# Patient Record
Sex: Male | Born: 1955 | Race: White | Hispanic: No | Marital: Married | Smoking: Former smoker
Health system: Southern US, Community
[De-identification: ages and names within clinical notes are randomized; demographics above are authoritative.]

## PROBLEM LIST (undated history)

## (undated) DIAGNOSIS — N3941 Urge incontinence: Secondary | ICD-10-CM

## (undated) DIAGNOSIS — G709 Myoneural disorder, unspecified: Secondary | ICD-10-CM

## (undated) DIAGNOSIS — M549 Dorsalgia, unspecified: Secondary | ICD-10-CM

## (undated) DIAGNOSIS — R002 Palpitations: Secondary | ICD-10-CM

## (undated) DIAGNOSIS — M51369 Other intervertebral disc degeneration, lumbar region without mention of lumbar back pain or lower extremity pain: Secondary | ICD-10-CM

## (undated) DIAGNOSIS — N400 Enlarged prostate without lower urinary tract symptoms: Secondary | ICD-10-CM

## (undated) DIAGNOSIS — G20 Parkinson's disease: Secondary | ICD-10-CM

## (undated) DIAGNOSIS — M109 Gout, unspecified: Secondary | ICD-10-CM

## (undated) DIAGNOSIS — K760 Fatty (change of) liver, not elsewhere classified: Secondary | ICD-10-CM

## (undated) DIAGNOSIS — F410 Panic disorder [episodic paroxysmal anxiety] without agoraphobia: Secondary | ICD-10-CM

## (undated) DIAGNOSIS — M5136 Other intervertebral disc degeneration, lumbar region: Secondary | ICD-10-CM

## (undated) DIAGNOSIS — N200 Calculus of kidney: Secondary | ICD-10-CM

## (undated) DIAGNOSIS — G20A1 Parkinson's disease without dyskinesia, without mention of fluctuations: Secondary | ICD-10-CM

## (undated) DIAGNOSIS — G4730 Sleep apnea, unspecified: Secondary | ICD-10-CM

## (undated) DIAGNOSIS — J309 Allergic rhinitis, unspecified: Secondary | ICD-10-CM

## (undated) HISTORY — PX: APPENDECTOMY: SHX54

## (undated) HISTORY — PX: TONSILLECTOMY: SUR1361

## (undated) SURGERY — Surgical Case
Anesthesia: *Unknown

---

## 2008-02-01 ENCOUNTER — Ambulatory Visit: Payer: Self-pay | Admitting: Internal Medicine

## 2008-12-24 ENCOUNTER — Emergency Department: Payer: Self-pay | Admitting: Emergency Medicine

## 2009-04-22 ENCOUNTER — Emergency Department: Payer: Self-pay | Admitting: Emergency Medicine

## 2010-07-31 ENCOUNTER — Emergency Department: Payer: Self-pay | Admitting: Internal Medicine

## 2012-02-07 DIAGNOSIS — N419 Inflammatory disease of prostate, unspecified: Secondary | ICD-10-CM | POA: Insufficient documentation

## 2012-03-13 DIAGNOSIS — Z809 Family history of malignant neoplasm, unspecified: Secondary | ICD-10-CM | POA: Insufficient documentation

## 2013-04-06 DIAGNOSIS — R102 Pelvic and perineal pain: Secondary | ICD-10-CM | POA: Insufficient documentation

## 2013-04-06 DIAGNOSIS — N3941 Urge incontinence: Secondary | ICD-10-CM | POA: Insufficient documentation

## 2013-04-06 DIAGNOSIS — R3915 Urgency of urination: Secondary | ICD-10-CM | POA: Insufficient documentation

## 2013-04-06 DIAGNOSIS — R39198 Other difficulties with micturition: Secondary | ICD-10-CM | POA: Insufficient documentation

## 2013-04-06 DIAGNOSIS — R351 Nocturia: Secondary | ICD-10-CM | POA: Insufficient documentation

## 2013-11-07 ENCOUNTER — Emergency Department: Payer: Self-pay | Admitting: Emergency Medicine

## 2013-11-07 LAB — CBC
HCT: 45.9 % (ref 40.0–52.0)
HGB: 15.9 g/dL (ref 13.0–18.0)
MCH: 31 pg (ref 26.0–34.0)
MCHC: 34.6 g/dL (ref 32.0–36.0)
MCV: 90 fL (ref 80–100)
PLATELETS: 202 10*3/uL (ref 150–440)
RBC: 5.11 10*6/uL (ref 4.40–5.90)
RDW: 13 % (ref 11.5–14.5)
WBC: 8.1 10*3/uL (ref 3.8–10.6)

## 2013-11-07 LAB — TROPONIN I
Troponin-I: <0.02 ng/mL
Troponin-I: <0.02 ng/mL

## 2013-11-07 LAB — BASIC METABOLIC PANEL
Anion Gap: 2 — ABNORMAL LOW (ref 7–16)
BUN: 8 mg/dL (ref 7–18)
CALCIUM: 8.6 mg/dL (ref 8.5–10.1)
CHLORIDE: 105 mmol/L (ref 98–107)
CO2: 30 mmol/L (ref 21–32)
CREATININE: 1.31 mg/dL — AB (ref 0.60–1.30)
EGFR (African American): >60
EGFR (Non-African Amer.): >60
GLUCOSE: 96 mg/dL (ref 65–99)
Osmolality: 272 (ref 275–301)
Potassium: 3.6 mmol/L (ref 3.5–5.1)
Sodium: 137 mmol/L (ref 136–145)

## 2013-11-07 LAB — D-DIMER(ARMC): D-Dimer: 199 ng/ml

## 2013-11-11 ENCOUNTER — Ambulatory Visit: Payer: Self-pay | Admitting: Internal Medicine

## 2014-06-29 DIAGNOSIS — R1031 Right lower quadrant pain: Secondary | ICD-10-CM | POA: Insufficient documentation

## 2014-06-29 DIAGNOSIS — N200 Calculus of kidney: Secondary | ICD-10-CM | POA: Insufficient documentation

## 2014-06-29 DIAGNOSIS — R350 Frequency of micturition: Secondary | ICD-10-CM | POA: Insufficient documentation

## 2014-08-16 DIAGNOSIS — J309 Allergic rhinitis, unspecified: Secondary | ICD-10-CM | POA: Insufficient documentation

## 2014-08-16 DIAGNOSIS — N400 Enlarged prostate without lower urinary tract symptoms: Secondary | ICD-10-CM | POA: Insufficient documentation

## 2014-08-16 DIAGNOSIS — F410 Panic disorder [episodic paroxysmal anxiety] without agoraphobia: Secondary | ICD-10-CM | POA: Insufficient documentation

## 2014-08-16 DIAGNOSIS — M5136 Other intervertebral disc degeneration, lumbar region: Secondary | ICD-10-CM | POA: Insufficient documentation

## 2014-08-16 DIAGNOSIS — F411 Generalized anxiety disorder: Secondary | ICD-10-CM | POA: Insufficient documentation

## 2014-08-16 DIAGNOSIS — M51369 Other intervertebral disc degeneration, lumbar region without mention of lumbar back pain or lower extremity pain: Secondary | ICD-10-CM | POA: Insufficient documentation

## 2015-11-17 ENCOUNTER — Emergency Department: Payer: Self-pay

## 2015-11-17 ENCOUNTER — Observation Stay
Admission: EM | Admit: 2015-11-17 | Discharge: 2015-11-19 | Disposition: A | Discharge status: Home / Self Care | Payer: Self-pay | Attending: Internal Medicine | Admitting: Internal Medicine

## 2015-11-17 ENCOUNTER — Encounter: Payer: Self-pay | Admitting: Emergency Medicine

## 2015-11-17 DIAGNOSIS — R748 Abnormal levels of other serum enzymes: Secondary | ICD-10-CM | POA: Insufficient documentation

## 2015-11-17 DIAGNOSIS — Z79899 Other long term (current) drug therapy: Secondary | ICD-10-CM | POA: Insufficient documentation

## 2015-11-17 DIAGNOSIS — F419 Anxiety disorder, unspecified: Secondary | ICD-10-CM | POA: Insufficient documentation

## 2015-11-17 DIAGNOSIS — Z886 Allergy status to analgesic agent status: Secondary | ICD-10-CM | POA: Insufficient documentation

## 2015-11-17 DIAGNOSIS — Z833 Family history of diabetes mellitus: Secondary | ICD-10-CM | POA: Insufficient documentation

## 2015-11-17 DIAGNOSIS — Z9889 Other specified postprocedural states: Secondary | ICD-10-CM | POA: Insufficient documentation

## 2015-11-17 DIAGNOSIS — Z87892 Personal history of anaphylaxis: Secondary | ICD-10-CM | POA: Insufficient documentation

## 2015-11-17 DIAGNOSIS — Z8249 Family history of ischemic heart disease and other diseases of the circulatory system: Secondary | ICD-10-CM | POA: Insufficient documentation

## 2015-11-17 DIAGNOSIS — R778 Other specified abnormalities of plasma proteins: Secondary | ICD-10-CM

## 2015-11-17 DIAGNOSIS — R079 Chest pain, unspecified: Principal | ICD-10-CM | POA: Insufficient documentation

## 2015-11-17 DIAGNOSIS — N400 Enlarged prostate without lower urinary tract symptoms: Secondary | ICD-10-CM | POA: Insufficient documentation

## 2015-11-17 DIAGNOSIS — M545 Low back pain: Secondary | ICD-10-CM | POA: Insufficient documentation

## 2015-11-17 DIAGNOSIS — M549 Dorsalgia, unspecified: Secondary | ICD-10-CM | POA: Insufficient documentation

## 2015-11-17 DIAGNOSIS — Z87891 Personal history of nicotine dependence: Secondary | ICD-10-CM | POA: Insufficient documentation

## 2015-11-17 DIAGNOSIS — Z880 Allergy status to penicillin: Secondary | ICD-10-CM | POA: Insufficient documentation

## 2015-11-17 DIAGNOSIS — Z888 Allergy status to other drugs, medicaments and biological substances status: Secondary | ICD-10-CM | POA: Insufficient documentation

## 2015-11-17 DIAGNOSIS — F410 Panic disorder [episodic paroxysmal anxiety] without agoraphobia: Secondary | ICD-10-CM | POA: Insufficient documentation

## 2015-11-17 DIAGNOSIS — R7989 Other specified abnormal findings of blood chemistry: Secondary | ICD-10-CM

## 2015-11-17 DIAGNOSIS — Z7901 Long term (current) use of anticoagulants: Secondary | ICD-10-CM | POA: Insufficient documentation

## 2015-11-17 DIAGNOSIS — R0602 Shortness of breath: Secondary | ICD-10-CM | POA: Insufficient documentation

## 2015-11-17 DIAGNOSIS — G8929 Other chronic pain: Secondary | ICD-10-CM | POA: Insufficient documentation

## 2015-11-17 HISTORY — DX: Panic disorder (episodic paroxysmal anxiety): F41.0

## 2015-11-17 HISTORY — DX: Benign prostatic hyperplasia without lower urinary tract symptoms: N40.0

## 2015-11-17 HISTORY — DX: Dorsalgia, unspecified: M54.9

## 2015-11-17 LAB — COMPREHENSIVE METABOLIC PANEL
ALK PHOS: 61 U/L (ref 38–126)
ALT: 27 U/L (ref 17–63)
ANION GAP: 6 (ref 5–15)
AST: 31 U/L (ref 15–41)
Albumin: 4.3 g/dL (ref 3.5–5.0)
BUN: 8 mg/dL (ref 6–20)
CALCIUM: 9.2 mg/dL (ref 8.9–10.3)
CO2: 25 mmol/L (ref 22–32)
CREATININE: 1.07 mg/dL (ref 0.61–1.24)
Chloride: 106 mmol/L (ref 101–111)
Glucose, Bld: 114 mg/dL — ABNORMAL HIGH (ref 65–99)
Potassium: 4 mmol/L (ref 3.5–5.1)
SODIUM: 137 mmol/L (ref 135–145)
TOTAL PROTEIN: 7.6 g/dL (ref 6.5–8.1)
Total Bilirubin: 0.9 mg/dL (ref 0.3–1.2)

## 2015-11-17 LAB — TROPONIN I
TROPONIN I: 0.07 ng/mL — AB (ref <0.031)
TROPONIN I: 0.57 ng/mL — AB (ref <0.031)
Troponin I: <0.03 ng/mL (ref <0.031)
Troponin I: 0.3 ng/mL — ABNORMAL HIGH (ref <0.031)

## 2015-11-17 LAB — CBC
HEMATOCRIT: 45 % (ref 40.0–52.0)
Hemoglobin: 15.6 g/dL (ref 13.0–18.0)
MCH: 31.3 pg (ref 26.0–34.0)
MCHC: 34.7 g/dL (ref 32.0–36.0)
MCV: 90.1 fL (ref 80.0–100.0)
Platelets: 187 10*3/uL (ref 150–440)
RBC: 5 MIL/uL (ref 4.40–5.90)
RDW: 13.4 % (ref 11.5–14.5)
WBC: 6.8 10*3/uL (ref 3.8–10.6)

## 2015-11-17 LAB — GLUCOSE, CAPILLARY: Glucose-Capillary: 90 mg/dL (ref 65–99)

## 2015-11-17 MED ORDER — CLONAZEPAM 0.5 MG PO TABS
0.5000 mg | ORAL_TABLET | Freq: Two times a day (BID) | ORAL | Status: DC
  Administered 2015-11-17 – 2015-11-19 (×4): 0.5 mg via ORAL
  Filled 2015-11-17 (×4): qty 1

## 2015-11-17 MED ORDER — ACETAMINOPHEN 650 MG RE SUPP: 650.0000 mg | Freq: Four times a day (QID) | RECTAL | Status: DC | PRN

## 2015-11-17 MED ORDER — KETOROLAC TROMETHAMINE 30 MG/ML IJ SOLN
30.0000 mg | Freq: Once | INTRAMUSCULAR | Status: AC
  Administered 2015-11-17: 30 mg via INTRAVENOUS
  Filled 2015-11-17: qty 1

## 2015-11-17 MED ORDER — NITROGLYCERIN 0.4 MG SL SUBL: 0.4000 mg | SUBLINGUAL_TABLET | SUBLINGUAL | Status: DC | PRN

## 2015-11-17 MED ORDER — FAMOTIDINE 20 MG PO TABS
20.0000 mg | ORAL_TABLET | Freq: Two times a day (BID) | ORAL | Status: DC
  Filled 2015-11-17 (×4): qty 1

## 2015-11-17 MED ORDER — ZOLPIDEM TARTRATE 5 MG PO TABS: 5.0000 mg | ORAL_TABLET | Freq: Every evening | ORAL | Status: DC | PRN

## 2015-11-17 MED ORDER — BUPROPION HCL 100 MG PO TABS
100.0000 mg | ORAL_TABLET | Freq: Every day | ORAL | Status: DC
  Filled 2015-11-17 (×3): qty 1

## 2015-11-17 MED ORDER — NITROGLYCERIN 2 % TD OINT
1.0000 [in_us] | TOPICAL_OINTMENT | Freq: Once | TRANSDERMAL | Status: AC
  Administered 2015-11-17: 1 [in_us] via TOPICAL
  Filled 2015-11-17: qty 1

## 2015-11-17 MED ORDER — ONDANSETRON HCL 4 MG PO TABS: 4.0000 mg | ORAL_TABLET | Freq: Four times a day (QID) | ORAL | Status: DC | PRN

## 2015-11-17 MED ORDER — ACETAMINOPHEN 325 MG PO TABS
650.0000 mg | ORAL_TABLET | Freq: Four times a day (QID) | ORAL | Status: DC | PRN
  Administered 2015-11-17 – 2015-11-18 (×2): 650 mg via ORAL
  Filled 2015-11-17 (×2): qty 2

## 2015-11-17 MED ORDER — ASPIRIN 81 MG PO CHEW
324.0000 mg | CHEWABLE_TABLET | Freq: Once | ORAL | Status: AC
  Administered 2015-11-17: 324 mg via ORAL
  Filled 2015-11-17: qty 4

## 2015-11-17 MED ORDER — OXYCODONE HCL 5 MG PO TABS: 5.0000 mg | ORAL_TABLET | ORAL | Status: DC | PRN

## 2015-11-17 MED ORDER — DOCUSATE SODIUM 100 MG PO CAPS
100.0000 mg | ORAL_CAPSULE | Freq: Two times a day (BID) | ORAL | Status: DC
  Administered 2015-11-17 – 2015-11-19 (×4): 100 mg via ORAL
  Filled 2015-11-17 (×4): qty 1

## 2015-11-17 MED ORDER — ENOXAPARIN SODIUM 120 MG/0.8ML ~~LOC~~ SOLN
1.0000 mg/kg | Freq: Two times a day (BID) | SUBCUTANEOUS | Status: AC
  Administered 2015-11-18 (×2): 115 mg via SUBCUTANEOUS
  Filled 2015-11-17 (×4): qty 0.8

## 2015-11-17 MED ORDER — DIAZEPAM 5 MG/ML IJ SOLN
5.0000 mg | Freq: Once | INTRAMUSCULAR | Status: AC
  Administered 2015-11-17: 5 mg via INTRAVENOUS
  Filled 2015-11-17: qty 2

## 2015-11-17 MED ORDER — METOPROLOL TARTRATE 25 MG PO TABS
12.5000 mg | ORAL_TABLET | Freq: Two times a day (BID) | ORAL | Status: DC
  Administered 2015-11-17 – 2015-11-19 (×4): 12.5 mg via ORAL
  Filled 2015-11-17 (×5): qty 1

## 2015-11-17 MED ORDER — MORPHINE SULFATE (PF) 2 MG/ML IV SOLN: 2.0000 mg | INTRAVENOUS | Status: DC | PRN

## 2015-11-17 MED ORDER — ONDANSETRON HCL 4 MG/2ML IJ SOLN: 4.0000 mg | Freq: Four times a day (QID) | INTRAMUSCULAR | Status: DC | PRN

## 2015-11-17 MED ORDER — CYCLOBENZAPRINE HCL 10 MG PO TABS: 10.0000 mg | ORAL_TABLET | Freq: Two times a day (BID) | ORAL | Status: DC | PRN

## 2015-11-17 MED ORDER — ENOXAPARIN SODIUM 120 MG/0.8ML ~~LOC~~ SOLN
1.0000 mg/kg | Freq: Once | SUBCUTANEOUS | Status: AC
  Administered 2015-11-17: 115 mg via SUBCUTANEOUS
  Filled 2015-11-17: qty 0.8

## 2015-11-17 MED ORDER — SODIUM CHLORIDE 0.9% FLUSH
3.0000 mL | Freq: Two times a day (BID) | INTRAVENOUS | Status: DC
  Administered 2015-11-17 – 2015-11-19 (×4): 3 mL via INTRAVENOUS

## 2015-11-17 NOTE — Progress Notes (Signed)
Patient arrived to 2A Room 249. Patient denies pain and all questions answered. Patient oriented to unit and Fall Safety Plan signed. Skin assessment completed with Vincente Liberty RN and skin intact. A&Ox4, VSS, and NSR on tele box #[id number]. Nursing staff will continue to monitor. Gabriel Reaper, RN

## 2015-11-17 NOTE — ED Notes (Signed)
Report given to Kim, RN.

## 2015-11-17 NOTE — ED Notes (Signed)
Pt reports having a headache at this time. MD made aware.

## 2015-11-17 NOTE — Progress Notes (Signed)
MD notified of critical troponin 0.57; no new orders at this time. Nursing staff will continue to monitor. Earleen Reaper, RN

## 2015-11-17 NOTE — H&P (Signed)
[place] at [place] NAME: Pranith Veeder    MR#:  [id number]  DATE OF BIRTH:  05-Sep-1955  DATE OF ADMISSION:  11/17/2015  PRIMARY CARE PHYSICIAN: No primary care provider on file.   REQUESTING/REFERRING PHYSICIAN: Dr. Jimmye Norman  CHIEF COMPLAINT:  Chest pain  HISTORY OF PRESENT ILLNESS:  Nagee Prey  is a 60 y.o. male with a known history of Panic attacks, chronic low back pain has been experiencing chest pain radiating to the left arm and upper back today morning at around 8:00. He feels like it is a crampy pain. EMS brought him to the ED, patient was not given aspirin as he is allergic to and states it hives. Nitropaste was at as to the anterior chest wall and patient was chest pain-free during my examination. Reporting that patient was having panic attack prior to this chest pain episode. He was short of breath that you make and tachycardic and was panic prior to this episode. Not had any heart attacks in the past and not seen by any cardiologist. Initial troponin is 0.03 but repeat one is at 0.07  PAST MEDICAL HISTORY:   Past Medical History  Diagnosis Date  . Panic attacks   . Back pain   . Enlarged prostate     PAST SURGICAL HISTOIRY:   Past Surgical History  Procedure Laterality Date  . Appendectomy    . Tonsillectomy      SOCIAL HISTORY:   Social History  Substance Use Topics  . Smoking status: Former Research scientist (life sciences)  . Smokeless tobacco: Not on file  . Alcohol Use: No    FAMILY HISTORY:  Hypertension diabetes runs in his family  DRUG ALLERGIES:   Allergies  Allergen Reactions  . Ibuprofen Hives  . Penicillins Hives    REVIEW OF SYSTEMS:  CONSTITUTIONAL: No fever, fatigue or weakness.  EYES: No blurred or double vision.  EARS, NOSE, AND THROAT: No tinnitus or ear pain.  RESPIRATORY: No cough, shortness of breath, wheezing or hemoptysis.  CARDIOVASCULAR: No chest pain, orthopnea, edema.  GASTROINTESTINAL: No  nausea, vomiting, diarrhea or abdominal pain.  GENITOURINARY: No dysuria, hematuria.  ENDOCRINE: No polyuria, nocturia,  HEMATOLOGY: No anemia, easy bruising or bleeding SKIN: No rash or lesion. MUSCULOSKELETAL: No joint pain or arthritis.   NEUROLOGIC: No tingling, numbness, weakness.  PSYCHIATRY: No anxiety or depression.   MEDICATIONS AT HOME:   Prior to Admission medications   Not on File      VITAL SIGNS:  Blood pressure 117/81, pulse 92, temperature 98.4 F (36.9 C), temperature source Oral, resp. rate 20, height 5\' 8"  (1.727 m), weight 117.7 kg (259 lb 7.7 oz), SpO2 96 %.  PHYSICAL EXAMINATION:  GENERAL:  60 y.o.-year-old patient lying in the bed with no acute distress.  EYES: Pupils equal, round, reactive to light and accommodation. No scleral icterus. Extraocular muscles intact.  HEENT: Head atraumatic, normocephalic. Oropharynx and nasopharynx clear.  NECK:  Supple, no jugular venous distention. No thyroid enlargement, no tenderness.  LUNGS: Normal breath sounds bilaterally, no wheezing, rales,rhonchi or crepitation. No use of accessory muscles of respiration.  CARDIOVASCULAR: S1, S2 normal. No murmurs, rubs, or gallops. No anterior chest wall tenderness on palpation ABDOMEN: Soft, nontender, nondistended. Bowel sounds present. No organomegaly or mass.  EXTREMITIES: No pedal edema, cyanosis, or clubbing.  NEUROLOGIC: Cranial nerves II through XII are intact. Muscle strength 5/5 in all extremities. Sensation intact. Gait not checked.  PSYCHIATRIC: The patient is alert and oriented  x 3.  SKIN: No obvious rash, lesion, or ulcer.   LABORATORY PANEL:   CBC  Recent Labs Lab 11/17/15 0852  WBC 6.8  HGB 15.6  HCT 45.0  PLT 187   ------------------------------------------------------------------------------------------------------------------  Chemistries   Recent Labs Lab 11/17/15 0852  NA 137  K 4.0  CL 106  CO2 25  GLUCOSE 114*  BUN 8  CREATININE 1.07   CALCIUM 9.2  AST 31  ALT 27  ALKPHOS 61  BILITOT 0.9   ------------------------------------------------------------------------------------------------------------------  Cardiac Enzymes  Recent Labs Lab 11/17/15 1106  TROPONINI 0.07*   ------------------------------------------------------------------------------------------------------------------  RADIOLOGY:  Dg Chest 2 View  11/17/2015  CLINICAL DATA:  Upper back pain radiating into chest pain down left arm this morning. EXAM: CHEST  2 VIEW COMPARISON:  11/07/2013 FINDINGS: The heart size and mediastinal contours are within normal limits. Both lungs are clear. The visualized skeletal structures are unremarkable. IMPRESSION: No active cardiopulmonary disease. Electronically Signed   By: Rolm Baptise M.D.   On: 11/17/2015 09:27    EKG:   Orders placed or performed during the hospital encounter of 11/17/15  . ED EKG  . ED EKG  . EKG 12-Lead  . EKG 12-Lead  EKG normal sinus rhythm with no acute ST-T wave changes  IMPRESSION AND PLAN:   Jarry Mihalek  is a 60 y.o. male with a known history of Panic attacks, chronic low back pain has been experiencing chest pain radiating to the left arm and upper back today morning at around 8:00. He feels like it is a crampy pain. EMS brought him to the ED, patient was not given aspirin as he is allergic to and states it hives. Nitropaste was at as to the anterior chest wall and patient was chest pain-free during my examination. Reporting that patient was having panic attack prior to this chest pain episode  #1 chest pain with elevated troponin Admitted to telemetry and cycle cardiac biomarkers Will obtain echocardiogram and cardiac consult is placed Patient is allergic to ibuprofen-gets hives aspirin was not given Lovenox 1 mg/kg subcutaneous every 12 hours will be given Check fasting lipid panel, we will start him on low-dose Lopressor  #2, chronic history of panic attacks Currently  patient is asymptomatic We will resume his home medications after med reconciliation  #3 chronic low back pain Provide pain management as needed  #4 benign prostatic hypertrophy We will resume his home medication after medication reconciliation  DVT prophylaxis currently patient is on Lovenox therapeutic dose  More than 50% time was spent on face-to-face education, counseling and coordination of care     All the records are reviewed and case discussed with ED provider. Management plans discussed with the patient, family and they are in agreement.  CODE STATUS: fc, wife POA  TOTAL TIME TAKING CARE OF THIS PATIENT: 45  minutes.    Nicholes Mango M.D on 11/17/2015 at 1:06 PM  Between 7am to 6pm - Pager - [phone]  After 6pm go to [email or web address] - password [email or web address]  [place] [facility]  Office  [phone]  CC: Primary care physician; No primary care provider on file.

## 2015-11-17 NOTE — ED Notes (Signed)
Patient brought in by [facility], per EMS patient walked into EMS bay stating that he was having chest pain, left arm and upper back pain. Patient has hx/o panic attacks and states that he has been having some life stressors recently.

## 2015-11-17 NOTE — ED Provider Notes (Signed)
[facility] Emergency Department Provider Note     Time seen: ----------------------------------------- 8:49 AM on 11/17/2015 -----------------------------------------    I have reviewed the triage vital signs and the nursing notes.   HISTORY  Chief Complaint Chest Pain    HPI Gabriel Dillon is a 60 y.o. male who presented to the EMS today stating he was having chest pain left arm and upper back pain. Patient states he has never had this happen before, feels like a cramp. He has not had any associated symptoms such as sweats, nausea, shortness of breath. He has a history of panic attacks and states he's had increased life stressors dealing with some family deaths. Patient denies recent illness.   No past medical history on file.  There are no active problems to display for this patient.   No past surgical history on file.  Allergies Review of patient's allergies indicates not on file.  Social History Social History  Substance Use Topics  . Smoking status: Not on file  . Smokeless tobacco: Not on file  . Alcohol Use: Not on file    Review of Systems Constitutional: Negative for fever. Eyes: Negative for visual changes. ENT: Negative for sore throat. Cardiovascular: Positive for left upper chest pain Respiratory: Negative for shortness of breath. Gastrointestinal: Negative for abdominal pain, vomiting and diarrhea. Genitourinary: Negative for dysuria. Musculoskeletal: Positive for left upper back pain, left arm pain Skin: Negative for rash. Neurological: Negative for headaches, focal weakness or numbness.  10-point ROS otherwise negative.  ____________________________________________   PHYSICAL EXAM:  VITAL SIGNS: ED Triage Vitals  Enc Vitals Group     BP --      Pulse --      Resp --      Temp --      Temp src --      SpO2 --      Weight --      Height --      Head Cir --      Peak Flow --      Pain Score --      Pain  Loc --      Pain Edu? --      Excl. in [place]? --     Constitutional: Alert and oriented. Well appearing and in no distress. Eyes: Conjunctivae are normal. PERRL. Normal extraocular movements. ENT   Head: Normocephalic and atraumatic.   Nose: No congestion/rhinnorhea.   Mouth/Throat: Mucous membranes are moist.   Neck: No stridor. Cardiovascular: Normal rate, regular rhythm. Normal and symmetric distal pulses are present in all extremities. No murmurs, rubs, or gallops. Respiratory: Normal respiratory effort without tachypnea nor retractions. Breath sounds are clear and equal bilaterally. No wheezes/rales/rhonchi. Gastrointestinal: Soft and nontender. No distention. No abdominal bruits.  Musculoskeletal: Nontender with normal range of motion in all extremities. Mild pain with range of motion of the shoulder, left trapezius muscle tenderness Neurologic:  Normal speech and language. No gross focal neurologic deficits are appreciated.  Skin:  Skin is warm, dry and intact. No rash noted. Psychiatric: Mood and affect are normal. Speech and behavior are normal. Patient exhibits appropriate insight and judgment. ____________________________________________  EKG: Interpreted by me. Normal sinus rhythm with a rate of 85 bpm, normal PR interval, normal QRS, normal QT interval. Normal axis. No evidence of acute infarction  ____________________________________________  ED COURSE:  Pertinent labs & imaging results that were available during my care of the patient were reviewed by me and considered in my medical decision  making (see chart for details). Patient is in no acute distress, will check basic labs and reevaluate. He is low risk for ACS. ____________________________________________    LABS (pertinent positives/negatives)  Labs Reviewed  COMPREHENSIVE METABOLIC PANEL - Abnormal; Notable for the following:    Glucose, Bld 114 (*)    All other components within normal limits   TROPONIN I - Abnormal; Notable for the following:    Troponin I 0.07 (*)    All other components within normal limits  CBC  TROPONIN I    RADIOLOGY Images were viewed by me  Chest x-ray IMPRESSION: No active cardiopulmonary disease. ____________________________________________  FINAL ASSESSMENT AND PLAN  Chest pain, elevated troponin  Plan: Patient with labs and imaging as dictated above. Patient presented with what appeared to be musculoskeletal pain. Repeat troponin went from 0.03 to .07. He has no risk factors that he knows of, no family history. He'll be given aspirin, nitroglycerin, Lovenox. I will discuss with the hospitalist for admission.   Earleen Newport, MD   Earleen Newport, MD 11/17/15 [phone]

## 2015-11-18 ENCOUNTER — Observation Stay
Admit: 2015-11-18 | Discharge: 2015-11-18 | Disposition: A | Discharge status: Home / Self Care | Payer: Self-pay | Attending: Internal Medicine | Admitting: Internal Medicine

## 2015-11-18 LAB — ECHOCARDIOGRAM COMPLETE
HEIGHTINCHES: 68 in
Weight: 4000 oz

## 2015-11-18 LAB — CBC
HEMATOCRIT: 40.6 % (ref 40.0–52.0)
HEMOGLOBIN: 14.1 g/dL (ref 13.0–18.0)
MCH: 32 pg (ref 26.0–34.0)
MCHC: 34.7 g/dL (ref 32.0–36.0)
MCV: 92 fL (ref 80.0–100.0)
Platelets: 168 10*3/uL (ref 150–440)
RBC: 4.41 MIL/uL (ref 4.40–5.90)
RDW: 13.4 % (ref 11.5–14.5)
WBC: 8.4 10*3/uL (ref 3.8–10.6)

## 2015-11-18 LAB — LIPID PANEL
CHOLESTEROL: 178 mg/dL (ref 0–200)
HDL: 34 mg/dL — ABNORMAL LOW (ref >40)
LDL CALC: 111 mg/dL — AB (ref 0–99)
Total CHOL/HDL Ratio: 5.2 RATIO
Triglycerides: 167 mg/dL — ABNORMAL HIGH (ref <150)
VLDL: 33 mg/dL (ref 0–40)

## 2015-11-18 LAB — COMPREHENSIVE METABOLIC PANEL
ALK PHOS: 52 U/L (ref 38–126)
ALT: 25 U/L (ref 17–63)
AST: 26 U/L (ref 15–41)
Albumin: 3.7 g/dL (ref 3.5–5.0)
Anion gap: 4 — ABNORMAL LOW (ref 5–15)
BUN: 12 mg/dL (ref 6–20)
CALCIUM: 8.9 mg/dL (ref 8.9–10.3)
CHLORIDE: 108 mmol/L (ref 101–111)
CO2: 28 mmol/L (ref 22–32)
CREATININE: 1.14 mg/dL (ref 0.61–1.24)
GFR calc Af Amer: >60 mL/min (ref >60)
Glucose, Bld: 93 mg/dL (ref 65–99)
Potassium: 3.7 mmol/L (ref 3.5–5.1)
Sodium: 140 mmol/L (ref 135–145)
TOTAL PROTEIN: 6.7 g/dL (ref 6.5–8.1)
Total Bilirubin: 0.8 mg/dL (ref 0.3–1.2)

## 2015-11-18 LAB — GLUCOSE, CAPILLARY
GLUCOSE-CAPILLARY: 88 mg/dL (ref 65–99)
Glucose-Capillary: 123 mg/dL — ABNORMAL HIGH (ref 65–99)

## 2015-11-18 LAB — TROPONIN I: TROPONIN I: 0.33 ng/mL — AB (ref <0.031)

## 2015-11-18 LAB — PROTIME-INR
INR: 1.15
PROTHROMBIN TIME: 14.9 s (ref 11.4–15.0)

## 2015-11-18 MED ORDER — ASPIRIN 81 MG PO CHEW
81.0000 mg | CHEWABLE_TABLET | Freq: Every day | ORAL | Status: DC
Start: 2015-11-18 — End: 2015-11-19
  Administered 2015-11-18 – 2015-11-19 (×2): 81 mg via ORAL
  Filled 2015-11-18 (×2): qty 1

## 2015-11-18 NOTE — Consult Note (Signed)
[facility] Cardiology  CARDIOLOGY CONSULT NOTE  Patient ID: Gabriel Dillon MRN: [id number] DOB/AGE: [birth year] 60 y.o.  Admit date: 11/17/2015 Referring Physician Mody Primary Physician [facility] Primary Cardiologist  Reason for Consultation elevated troponin  HPI: 60 year old gentleman referred for evaluation of elevated troponin. The patient reports on the day of admission, he experienced midscapular discomfort some radiation to his left arm. He went to the local fire department was noted to have elevated blood pressure. EMS was called, the patient was brought to [facility] emergency room. Patient was treated with topical nitrates. EKG was performed which revealed normal sinus rhythm with normal ECG. Admission labs were notable for borderline elevated troponin, peak of 0.57. Patient denies any recurrent midscapular pain or chest pain. The pressure has remained normal. The patient does have a history of anxiety and panic attacks.  Review of systems complete and found to be negative unless listed above     Past Medical History  Diagnosis Date  . Panic attacks   . Back pain   . Enlarged prostate     Past Surgical History  Procedure Laterality Date  . Appendectomy    . Tonsillectomy      Prescriptions prior to admission  Medication Sig Dispense Refill Last Dose  . acetaminophen (TYLENOL) 500 MG tablet Take 1-2 tablets by mouth every 6 (six) hours as needed.   prn  . buPROPion (WELLBUTRIN) 100 MG tablet Take 1 tablet by mouth daily.  0 11/16/2015 at Unknown time  . clonazePAM (KLONOPIN) 0.5 MG tablet Take 1 tablet by mouth 2 (two) times daily.  0 11/17/2015 at Unknown time  . cyclobenzaprine (FLEXERIL) 10 MG tablet Take 1 tablet by mouth 2 (two) times daily as needed.  0 prn   Social History   Social History  . Marital Status: Married    Spouse Name: N/A  . Number of Children: N/A  . Years of Education: N/A   Occupational History  . Not on file.   Social History Main Topics  . Smoking status:  Former Research scientist (life sciences)  . Smokeless tobacco: Not on file  . Alcohol Use: No  . Drug Use: No  . Sexual Activity: Not on file   Other Topics Concern  . Not on file   Social History Narrative  . No narrative on file    History reviewed. No pertinent family history.    Review of systems complete and found to be negative unless listed above      PHYSICAL EXAM  General: Well developed, well nourished, in no acute distress HEENT:  Normocephalic and atramatic Neck:  No JVD.  Lungs: Clear bilaterally to auscultation and percussion. Heart: HRRR . Normal S1 and S2 without gallops or murmurs.  Abdomen: Bowel sounds are positive, abdomen soft and non-tender  Msk:  Back normal, normal gait. Normal strength and tone for age. Extremities: No clubbing, cyanosis or edema.   Neuro: Alert and oriented X 3. Psych:  Good affect, responds appropriately  Labs:   Lab Results  Component Value Date   WBC 8.4 11/18/2015   HGB 14.1 11/18/2015   HCT 40.6 11/18/2015   MCV 92.0 11/18/2015   PLT 168 11/18/2015    Recent Labs Lab 11/18/15 0440  NA 140  K 3.7  CL 108  CO2 28  BUN 12  CREATININE 1.14  CALCIUM 8.9  PROT 6.7  BILITOT 0.8  ALKPHOS 52  ALT 25  AST 26  GLUCOSE 93   Lab Results  Component Value Date   TROPONINI 0.33*  11/18/2015    Lab Results  Component Value Date   CHOL 178 11/18/2015   Lab Results  Component Value Date   HDL 34* 11/18/2015   Lab Results  Component Value Date   LDLCALC 111* 11/18/2015   Lab Results  Component Value Date   TRIG 167* 11/18/2015   Lab Results  Component Value Date   CHOLHDL 5.2 11/18/2015   No results found for: LDLDIRECT    Radiology: Dg Chest 2 View  11/17/2015  CLINICAL DATA:  Upper back pain radiating into chest pain down left arm this morning. EXAM: CHEST  2 VIEW COMPARISON:  11/07/2013 FINDINGS: The heart size and mediastinal contours are within normal limits. Both lungs are clear. The visualized skeletal structures are  unremarkable. IMPRESSION: No active cardiopulmonary disease. Electronically Signed   By: Rolm Baptise M.D.   On: 11/17/2015 09:27    EKG: Normal sinus rhythm  ASSESSMENT AND PLAN:   1. Atypical chest pain, normal ECG, borderline elevated troponin 2. Initial elevated blood pressure, now normotensive  Recommendations  1. Agree with overall current therapy 2. Continue Lovenox for now 3. Review 2-D echocardiogram 4. Pending echocardiogram results, if patient does well without recurrent symptoms may consider discharge home for outpatient functional study. If patient has recurrent symptoms then would see with cardiac catheterization with selective coronary arteriography on 11/20/2015.   SignedIsaias Cowman MD,PhD, [facility] 11/18/2015, 9:47 AM

## 2015-11-18 NOTE — Progress Notes (Signed)
[place] at [place] NAME: Gabriel Dillon    MR#:  [id number]  DATE OF BIRTH:  01-01-1956  SUBJECTIVE:   No further chest pain  REVIEW OF SYSTEMS:    Review of Systems  Constitutional: Negative for fever, chills and malaise/fatigue.  HENT: Negative for ear discharge, ear pain, hearing loss, nosebleeds and sore throat.   Eyes: Negative for blurred vision and pain.  Respiratory: Negative for cough, hemoptysis, shortness of breath and wheezing.   Cardiovascular: Negative for chest pain, palpitations and leg swelling.  Gastrointestinal: Negative for nausea, vomiting, abdominal pain, diarrhea and blood in stool.  Genitourinary: Negative for dysuria.  Musculoskeletal: Negative for back pain.  Neurological: Negative for dizziness, tremors, speech change, focal weakness, seizures and headaches.  Endo/Heme/Allergies: Does not bruise/bleed easily.  Psychiatric/Behavioral: Negative for depression, suicidal ideas and hallucinations.    Tolerating Diet: yes      DRUG ALLERGIES:   Allergies  Allergen Reactions  . Ibuprofen Anaphylaxis  . Penicillins Anaphylaxis    Has patient had a PCN reaction causing immediate rash, facial/tongue/throat swelling, SOB or lightheadedness with hypotension: Yes Has patient had a PCN reaction causing severe rash involving mucus membranes or skin necrosis: Yes Has patient had a PCN reaction that required hospitalization Yes Has patient had a PCN reaction occurring within the last 10 years: Yes If all of the above answers are "NO", then may proceed with Cephalosporin use.   . Citalopram     dizziness     VITALS:  Blood pressure 114/75, pulse 60, temperature 98.1 F (36.7 C), temperature source Oral, resp. rate 18, height 5\' 8"  (1.727 m), weight 113.399 kg (250 lb), SpO2 97 %.  PHYSICAL EXAMINATION:   Physical Exam  Constitutional: He is oriented to person, place, and time and well-developed,  well-nourished, and in no distress. No distress.  HENT:  Head: Normocephalic.  Eyes: No scleral icterus.  Neck: Normal range of motion. Neck supple. No JVD present. No tracheal deviation present.  Cardiovascular: Normal rate, regular rhythm and normal heart sounds.  Exam reveals no gallop and no friction rub.   No murmur heard. Pulmonary/Chest: Effort normal and breath sounds normal. No respiratory distress. He has no wheezes. He has no rales. He exhibits no tenderness.  Abdominal: Soft. Bowel sounds are normal. He exhibits no distension and no mass. There is no tenderness. There is no rebound and no guarding.  Musculoskeletal: Normal range of motion. He exhibits no edema.  Neurological: He is alert and oriented to person, place, and time.  Skin: Skin is warm. No rash noted. No erythema.  Psychiatric: Affect and judgment normal.      LABORATORY PANEL:   CBC  Recent Labs Lab 11/18/15 0440  WBC 8.4  HGB 14.1  HCT 40.6  PLT 168   ------------------------------------------------------------------------------------------------------------------  Chemistries   Recent Labs Lab 11/18/15 0440  NA 140  K 3.7  CL 108  CO2 28  GLUCOSE 93  BUN 12  CREATININE 1.14  CALCIUM 8.9  AST 26  ALT 25  ALKPHOS 52  BILITOT 0.8   ------------------------------------------------------------------------------------------------------------------  Cardiac Enzymes  Recent Labs Lab 11/17/15 1628 11/17/15 2226 11/18/15 0440  TROPONINI 0.30* 0.57* 0.33*   ------------------------------------------------------------------------------------------------------------------  RADIOLOGY:  Dg Chest 2 View  11/17/2015  CLINICAL DATA:  Upper back pain radiating into chest pain down left arm this morning. EXAM: CHEST  2 VIEW COMPARISON:  11/07/2013 FINDINGS: The heart size and mediastinal contours are within normal limits. Both  lungs are clear. The visualized skeletal structures are unremarkable.  IMPRESSION: No active cardiopulmonary disease. Electronically Signed   By: Rolm Baptise M.D.   On: 11/17/2015 09:27     ASSESSMENT AND PLAN:   60 year old male with a history of panic attacks who presented with chest pain and elevated troponin.   1. Atypical chest pain with borderline elevated troponin: This is likely due to demand ischemia. Monitor patient on telemetry for another 24 hours. If patient does well without recurrent symptoms patient can be discharged tomorrow with an outpatient stress test. If patient has further chest pain then will need cardiac catheterization on Monday.   Continue aspirin and full dose Lovenox for now. LDL is over 100. Patient would benefit from outpatient evaluation and follow-up with his PCP.   2. Anxiety: Continue Wellbutrin and clonazepam.  3. Chronic lower back pain: Continue supportive management.    Management plans discussed with the patient and he is in agreement.  CODE STATUS: FULL  TOTAL TIME TAKING CARE OF THIS PATIENT: 30 minutes.  D/w dr Lorinda Creed   POSSIBLE D/C tomorrow, DEPENDING ON CLINICAL CONDITION.   Taylorann Tkach M.D on 11/18/2015 at 12:03 PM  Between 7am to 6pm - Pager - [phone] After 6pm go to [email or web address] - password [email or web address] [facility]  [place] [facility]  Office  [phone]  CC: Primary care physician; No primary care provider on file.  Note: This dictation was prepared with Dragon dictation along with smaller phrase technology. Any transcriptional errors that result from this process are unintentional.

## 2015-11-18 NOTE — Plan of Care (Signed)
Problem: Pain Managment: Goal: General experience of comfort will improve Outcome: Progressing No reports of pain this shift.  Problem: Tissue Perfusion: Goal: Risk factors for ineffective tissue perfusion will decrease Outcome: Progressing SQ lovenox & pt ambulatory in room and on unit  Problem: Activity: Goal: Risk for activity intolerance will decrease Outcome: Progressing Patient low falls risk, ambulatory in room and on unit.

## 2015-11-19 LAB — BASIC METABOLIC PANEL
ANION GAP: 8 (ref 5–15)
BUN: 16 mg/dL (ref 6–20)
CALCIUM: 9 mg/dL (ref 8.9–10.3)
CHLORIDE: 105 mmol/L (ref 101–111)
CO2: 24 mmol/L (ref 22–32)
Creatinine, Ser: 1.12 mg/dL (ref 0.61–1.24)
GFR calc non Af Amer: >60 mL/min (ref >60)
GLUCOSE: 82 mg/dL (ref 65–99)
Potassium: 3.8 mmol/L (ref 3.5–5.1)
Sodium: 137 mmol/L (ref 135–145)

## 2015-11-19 MED ORDER — ASPIRIN 81 MG PO CHEW: 81.0000 mg | CHEWABLE_TABLET | Freq: Every day | ORAL | Status: DC

## 2015-11-19 NOTE — Plan of Care (Signed)
Problem: Pain Managment: Goal: General experience of comfort will improve Outcome: Progressing No voiced complaints of pain.  No distress noted.  Continues to request meds for "panic attack".  Scheduled Klonopin given.

## 2015-11-19 NOTE — Progress Notes (Signed)
[facility] Cardiology  SUBJECTIVE: I don't have chest pain   Filed Vitals:   11/18/15 1941 11/18/15 2053 11/19/15 0410 11/19/15 0914  BP: 112/68 131/81 97/63 126/67  Pulse: 65 66 58 64  Temp: 98.1 F (36.7 C)  98.2 F (36.8 C) 97.5 F (36.4 C)  TempSrc: Oral  Oral Oral  Resp: 18  20 20   Height:      Weight:      SpO2: 95% 94% 98% 98%     Intake/Output Summary (Last 24 hours) at 11/19/15 1033 Last data filed at 11/19/15 0935  Gross per 24 hour  Intake    480 ml  Output   1450 ml  Net   -970 ml      PHYSICAL EXAM  General: Well developed, well nourished, in no acute distress HEENT:  Normocephalic and atramatic Neck:  No JVD.  Lungs: Clear bilaterally to auscultation and percussion. Heart: HRRR . Normal S1 and S2 without gallops or murmurs.  Abdomen: Bowel sounds are positive, abdomen soft and non-tender  Msk:  Back normal, normal gait. Normal strength and tone for age. Extremities: No clubbing, cyanosis or edema.   Neuro: Alert and oriented X 3. Psych:  Good affect, responds appropriately   LABS: Basic Metabolic Panel:  Recent Labs  11/18/15 0440 11/19/15 0420  NA 140 137  K 3.7 3.8  CL 108 105  CO2 28 24  GLUCOSE 93 82  BUN 12 16  CREATININE 1.14 1.12  CALCIUM 8.9 9.0   Liver Function Tests:  Recent Labs  11/17/15 0852 11/18/15 0440  AST 31 26  ALT 27 25  ALKPHOS 61 52  BILITOT 0.9 0.8  PROT 7.6 6.7  ALBUMIN 4.3 3.7   No results for input(s): LIPASE, AMYLASE in the last 72 hours. CBC:  Recent Labs  11/17/15 0852 11/18/15 0440  WBC 6.8 8.4  HGB 15.6 14.1  HCT 45.0 40.6  MCV 90.1 92.0  PLT 187 168   Cardiac Enzymes:  Recent Labs  11/17/15 1628 11/17/15 2226 11/18/15 0440  TROPONINI 0.30* 0.57* 0.33*   BNP: Invalid input(s): POCBNP D-Dimer: No results for input(s): DDIMER in the last 72 hours. Hemoglobin A1C: No results for input(s): HGBA1C in the last 72 hours. Fasting Lipid Panel:  Recent Labs  11/18/15 0440  CHOL 178   HDL 34*  LDLCALC 111*  TRIG 167*  CHOLHDL 5.2   Thyroid Function Tests: No results for input(s): TSH, T4TOTAL, T3FREE, THYROIDAB in the last 72 hours.  Invalid input(s): FREET3 Anemia Panel: No results for input(s): VITAMINB12, FOLATE, FERRITIN, TIBC, IRON, RETICCTPCT in the last 72 hours.  No results found.   Echo normal left ventricular function, LV ejection fraction 60-65%  TELEMETRY: Normal sinus rhythm:  ASSESSMENT AND PLAN:  Active Problems:   Chest pain    1. Atypical chest pain, normal ECG, borderline elevated troponin, normal left ventricular function, in the setting of anxiety and panic attacks, with elevated blood pressure, currently clinically stable, without recurrent symptoms  Recommendations  1. Agree with discharge home 2. Continue low-dose metoprolol 3. Follow-up with outpatient functional study    Gabriel Cowman, MD, PhD, [facility] 11/19/2015 10:33 AM

## 2015-11-19 NOTE — Progress Notes (Signed)
Pt d/c home; d/c instructions reviewed w/ pt; pt understanding was verbalized; IV removed catheter in tact, gauze dressing applied; all pt questions answered; central tele notified, tele d/c;  pt left unit via wheelchair accompanied by staff

## 2015-11-19 NOTE — Discharge Summary (Signed)
[facility] [place] NAME: Gabriel Dillon    MR#:  [id number]  DATE OF BIRTH:  1955-11-25  DATE OF ADMISSION:  11/17/2015 ADMITTING PHYSICIAN: Nicholes Mango, MD  DATE OF DISCHARGE: 11/19/2015  PRIMARY CARE PHYSICIAN: No primary care provider on file.    ADMISSION DIAGNOSIS:  Elevated troponin I level [R79.89] Chest pain, unspecified chest pain type [R07.9]  DISCHARGE DIAGNOSIS:  Active Problems:   Chest pain   SECONDARY DIAGNOSIS:   Past Medical History  Diagnosis Date  . Panic attacks   . Back pain   . Enlarged prostate     HOSPITAL COURSE:    60 year old male with a history of panic attacks who presented with chest pain and elevated troponin.   1. Atypical chest pain with borderline elevated troponin: This is  due to demand ischemia. He had no chest pain during hospital stay. He will follow-up with cardiology, Dr. Lorinda Creed for an outpatient functional stress test.. Continue aspirin at discharge.   LDL is over 100. Patient would benefit from outpatient evaluation and follow-up with his PCP/Dr Parashos. He was not discharged with a statin but was advised to watch diet and to start exercise which may help lower her LDL.   2. Anxiety: Continue Wellbutrin and clonazepam.  3. Chronic lower back pain: He can continue pain medication PRN    DISCHARGE CONDITIONS AND DIET:   Stable for discharge on a regular diet  CONSULTS OBTAINED:  Treatment Team:  Isaias Cowman, MD  DRUG ALLERGIES:   Allergies  Allergen Reactions  . Ibuprofen Anaphylaxis  . Penicillins Anaphylaxis    Has patient had a PCN reaction causing immediate rash, facial/tongue/throat swelling, SOB or lightheadedness with hypotension: Yes Has patient had a PCN reaction causing severe rash involving mucus membranes or skin necrosis: Yes Has patient had a PCN reaction that required hospitalization Yes Has patient had a PCN reaction occurring within  the last 10 years: Yes If all of the above answers are "NO", then may proceed with Cephalosporin use.   . Citalopram     dizziness     DISCHARGE MEDICATIONS:   Current Discharge Medication List    START taking these medications   Details  aspirin 81 MG chewable tablet Chew 1 tablet (81 mg total) by mouth daily. Qty: 90 tablet, Refills: 0      CONTINUE these medications which have NOT CHANGED   Details  acetaminophen (TYLENOL) 500 MG tablet Take 1-2 tablets by mouth every 6 (six) hours as needed.    buPROPion (WELLBUTRIN) 100 MG tablet Take 1 tablet by mouth daily. Refills: 0    clonazePAM (KLONOPIN) 0.5 MG tablet Take 1 tablet by mouth 2 (two) times daily. Refills: 0    cyclobenzaprine (FLEXERIL) 10 MG tablet Take 1 tablet by mouth 2 (two) times daily as needed. Refills: 0              Today   CHIEF COMPLAINT:  Patient is doing well this morning. Patient without chest pain since hospital stay.   VITAL SIGNS:  Blood pressure 126/67, pulse 64, temperature 97.5 F (36.4 C), temperature source Oral, resp. rate 20, height 5\' 8"  (1.727 m), weight 113.399 kg (250 lb), SpO2 98 %.   REVIEW OF SYSTEMS:  Review of Systems  Constitutional: Negative for fever, chills and malaise/fatigue.  HENT: Negative for ear discharge, ear pain, hearing loss, nosebleeds and sore throat.   Eyes: Negative for blurred vision and pain.  Respiratory: Negative  for cough, hemoptysis, shortness of breath and wheezing.   Cardiovascular: Negative for chest pain, palpitations and leg swelling.  Gastrointestinal: Negative for nausea, vomiting, abdominal pain, diarrhea and blood in stool.  Genitourinary: Negative for dysuria.  Musculoskeletal: Negative for back pain.  Neurological: Negative for dizziness, tremors, speech change, focal weakness, seizures and headaches.  Endo/Heme/Allergies: Does not bruise/bleed easily.  Psychiatric/Behavioral: Negative for depression, suicidal ideas and  hallucinations.     PHYSICAL EXAMINATION:  GENERAL:  60 y.o.-year-old patient lying in the bed with no acute distress.  NECK:  Supple, no jugular venous distention. No thyroid enlargement, no tenderness.  LUNGS: Normal breath sounds bilaterally, no wheezing, rales,rhonchi  No use of accessory muscles of respiration.  CARDIOVASCULAR: S1, S2 normal. No murmurs, rubs, or gallops.  ABDOMEN: Soft, non-tender, non-distended. Bowel sounds present. No organomegaly or mass.  EXTREMITIES: No pedal edema, cyanosis, or clubbing.  PSYCHIATRIC: The patient is alert and oriented x 3.  SKIN: No obvious rash, lesion, or ulcer.   DATA REVIEW:   CBC  Recent Labs Lab 11/18/15 0440  WBC 8.4  HGB 14.1  HCT 40.6  PLT 168    Chemistries   Recent Labs Lab 11/18/15 0440 11/19/15 0420  NA 140 137  K 3.7 3.8  CL 108 105  CO2 28 24  GLUCOSE 93 82  BUN 12 16  CREATININE 1.14 1.12  CALCIUM 8.9 9.0  AST 26  --   ALT 25  --   ALKPHOS 52  --   BILITOT 0.8  --     Cardiac Enzymes  Recent Labs Lab 11/17/15 1628 11/17/15 2226 11/18/15 0440  TROPONINI 0.30* 0.57* 0.33*    Microbiology Results  @[email or web address] @  RADIOLOGY:  No results found.    Management plans discussed with the patient and he is in agreement. Stable for discharge home  Patient should follow up with Dr Lorinda Creed in 1 week  CODE STATUS:     Code Status Orders        Start     Ordered   11/17/15 1939  Full code   Continuous     11/17/15 1938    Code Status History    Date Active Date Inactive Code Status Order ID Comments User Context   This patient has a current code status but no historical code status.      TOTAL TIME TAKING CARE OF THIS PATIENT: 35 minutes.    Note: This dictation was prepared with Dragon dictation along with smaller phrase technology. Any transcriptional errors that result from this process are unintentional.  Tanette Chauca M.D on 11/19/2015 at 10:20 AM  Between 7am to 6pm - Pager -  [phone] After 6pm go to [email or web address] - password [email or web address] [facility]  [facility]  Office  [phone]  CC: Primary care physician; No primary care provider on file.

## 2015-11-19 NOTE — Discharge Instructions (Signed)
Follow all MD discharge instructions. Take all medications as prescribed. Keep all follow up appointments. If your symptoms return, call your doctor. If you experience any new symptoms that are of concern to you or that are bothersome to you, call your doctor. For all questions and/or concerns, call your doctor. ° ° If you have a medical emergency, call 911 ° ° ° °

## 2015-11-30 ENCOUNTER — Other Ambulatory Visit: Payer: Self-pay | Admitting: Cardiology

## 2015-11-30 DIAGNOSIS — R079 Chest pain, unspecified: Secondary | ICD-10-CM

## 2015-11-30 DIAGNOSIS — R0602 Shortness of breath: Secondary | ICD-10-CM

## 2015-11-30 DIAGNOSIS — R42 Dizziness and giddiness: Secondary | ICD-10-CM

## 2015-12-08 ENCOUNTER — Ambulatory Visit
Admission: RE | Admit: 2015-12-08 | Discharge: 2015-12-08 | Disposition: A | Discharge status: Home / Self Care | Payer: Self-pay | Source: Ambulatory Visit | Attending: Cardiology | Admitting: Cardiology

## 2015-12-08 DIAGNOSIS — R42 Dizziness and giddiness: Secondary | ICD-10-CM | POA: Insufficient documentation

## 2015-12-08 DIAGNOSIS — R079 Chest pain, unspecified: Secondary | ICD-10-CM | POA: Insufficient documentation

## 2015-12-08 DIAGNOSIS — R0602 Shortness of breath: Secondary | ICD-10-CM | POA: Insufficient documentation

## 2015-12-08 MED ORDER — TECHNETIUM TC 99M SESTAMIBI - CARDIOLITE
13.0600 | Freq: Once | INTRAVENOUS | Status: AC | PRN
  Administered 2015-12-08: 09:00:00 13.06 via INTRAVENOUS

## 2015-12-08 MED ORDER — REGADENOSON 0.4 MG/5ML IV SOLN
0.4000 mg | Freq: Once | INTRAVENOUS | Status: AC
  Administered 2015-12-08: 0.4 mg via INTRAVENOUS

## 2015-12-08 MED ORDER — TECHNETIUM TC 99M SESTAMIBI - CARDIOLITE
31.2300 | Freq: Once | INTRAVENOUS | Status: AC | PRN
  Administered 2015-12-08: 31.23 via INTRAVENOUS

## 2015-12-11 LAB — NM MYOCAR MULTI W/SPECT W/WALL MOTION / EF
CHL CUP MPHR: 163 {beats}/min
CHL CUP NUCLEAR SRS: 7
CHL CUP NUCLEAR SSS: 5
CHL CUP STRESS STAGE 1 HR: 75 {beats}/min
CHL CUP STRESS STAGE 2 SPEED: 0 mph
CHL CUP STRESS STAGE 3 GRADE: 0 %
CHL CUP STRESS STAGE 5 HR: 88 {beats}/min
CSEPHR: 77 %
CSEPPHR: 126 {beats}/min
Estimated workload: 1 METS
Exercise duration (min): 1 min
Exercise duration (sec): 1 s
LV dias vol: 78 mL (ref 62–150)
LVSYSVOL: 31 mL
NUC STRESS TID: 0.78
Percent of predicted max HR: 77 %
Rest HR: 65 {beats}/min
SDS: 0
Stage 1 Grade: 0 %
Stage 1 Speed: 0 mph
Stage 2 Grade: 0 %
Stage 2 HR: 75 {beats}/min
Stage 3 HR: 126 {beats}/min
Stage 3 Speed: 0 mph
Stage 4 Grade: 0 %
Stage 4 HR: 111 {beats}/min
Stage 4 Speed: 0 mph
Stage 5 DBP: 89 mmHg
Stage 5 Grade: 0 %
Stage 5 SBP: 132 mmHg
Stage 5 Speed: 0 mph

## 2016-04-08 DIAGNOSIS — E538 Deficiency of other specified B group vitamins: Secondary | ICD-10-CM | POA: Insufficient documentation

## 2016-07-10 DIAGNOSIS — K760 Fatty (change of) liver, not elsewhere classified: Secondary | ICD-10-CM | POA: Insufficient documentation

## 2017-01-08 DIAGNOSIS — M47816 Spondylosis without myelopathy or radiculopathy, lumbar region: Secondary | ICD-10-CM | POA: Insufficient documentation

## 2017-04-24 DIAGNOSIS — G20A1 Parkinson's disease without dyskinesia, without mention of fluctuations: Secondary | ICD-10-CM | POA: Insufficient documentation

## 2017-04-24 DIAGNOSIS — G20 Parkinson's disease: Secondary | ICD-10-CM | POA: Insufficient documentation

## 2017-06-26 DIAGNOSIS — R259 Unspecified abnormal involuntary movements: Secondary | ICD-10-CM

## 2017-06-26 DIAGNOSIS — G252 Other specified forms of tremor: Secondary | ICD-10-CM | POA: Insufficient documentation

## 2017-06-26 DIAGNOSIS — K5900 Constipation, unspecified: Secondary | ICD-10-CM | POA: Insufficient documentation

## 2018-02-26 DIAGNOSIS — G2581 Restless legs syndrome: Secondary | ICD-10-CM | POA: Insufficient documentation

## 2018-05-07 DIAGNOSIS — G479 Sleep disorder, unspecified: Secondary | ICD-10-CM | POA: Insufficient documentation

## 2018-05-07 DIAGNOSIS — G4730 Sleep apnea, unspecified: Secondary | ICD-10-CM | POA: Insufficient documentation

## 2018-05-07 DIAGNOSIS — Z8659 Personal history of other mental and behavioral disorders: Secondary | ICD-10-CM | POA: Insufficient documentation

## 2018-06-23 DIAGNOSIS — Z6841 Body Mass Index (BMI) 40.0 and over, adult: Secondary | ICD-10-CM | POA: Insufficient documentation

## 2018-08-03 ENCOUNTER — Other Ambulatory Visit: Payer: Self-pay | Admitting: Gastroenterology

## 2018-08-03 DIAGNOSIS — R740 Nonspecific elevation of levels of transaminase and lactic acid dehydrogenase [LDH]: Principal | ICD-10-CM

## 2018-08-03 DIAGNOSIS — R7401 Elevation of levels of liver transaminase levels: Secondary | ICD-10-CM

## 2018-08-03 DIAGNOSIS — K859 Acute pancreatitis without necrosis or infection, unspecified: Secondary | ICD-10-CM

## 2018-08-26 ENCOUNTER — Other Ambulatory Visit: Payer: Self-pay | Admitting: Gastroenterology

## 2018-08-26 DIAGNOSIS — R7401 Elevation of levels of liver transaminase levels: Secondary | ICD-10-CM

## 2018-08-26 DIAGNOSIS — R740 Nonspecific elevation of levels of transaminase and lactic acid dehydrogenase [LDH]: Principal | ICD-10-CM

## 2018-08-27 ENCOUNTER — Ambulatory Visit
Admission: RE | Admit: 2018-08-27 | Discharge: 2018-08-27 | Disposition: A | Discharge status: Home / Self Care | Payer: Medicare Other | Source: Ambulatory Visit | Attending: Gastroenterology | Admitting: Gastroenterology

## 2018-08-27 DIAGNOSIS — R740 Nonspecific elevation of levels of transaminase and lactic acid dehydrogenase [LDH]: Secondary | ICD-10-CM | POA: Insufficient documentation

## 2018-08-27 DIAGNOSIS — K859 Acute pancreatitis without necrosis or infection, unspecified: Secondary | ICD-10-CM

## 2018-08-27 DIAGNOSIS — R7401 Elevation of levels of liver transaminase levels: Secondary | ICD-10-CM

## 2018-08-27 MED ORDER — GADOBUTROL 1 MMOL/ML IV SOLN
10.0000 mL | Freq: Once | INTRAVENOUS | Status: AC | PRN
  Administered 2018-08-27: 10 mL via INTRAVENOUS

## 2018-09-03 DIAGNOSIS — F410 Panic disorder [episodic paroxysmal anxiety] without agoraphobia: Secondary | ICD-10-CM | POA: Insufficient documentation

## 2018-09-03 DIAGNOSIS — F419 Anxiety disorder, unspecified: Secondary | ICD-10-CM | POA: Insufficient documentation

## 2018-09-18 DIAGNOSIS — M5442 Lumbago with sciatica, left side: Secondary | ICD-10-CM

## 2018-09-18 DIAGNOSIS — Z79899 Other long term (current) drug therapy: Secondary | ICD-10-CM | POA: Insufficient documentation

## 2018-09-18 DIAGNOSIS — G8929 Other chronic pain: Secondary | ICD-10-CM | POA: Insufficient documentation

## 2018-09-18 DIAGNOSIS — M5441 Lumbago with sciatica, right side: Secondary | ICD-10-CM

## 2018-10-02 ENCOUNTER — Ambulatory Visit (INDEPENDENT_AMBULATORY_CARE_PROVIDER_SITE_OTHER): Payer: Medicare Other | Admitting: Licensed Clinical Social Worker

## 2018-10-02 ENCOUNTER — Encounter: Payer: Self-pay | Admitting: Licensed Clinical Social Worker

## 2018-10-02 DIAGNOSIS — F410 Panic disorder [episodic paroxysmal anxiety] without agoraphobia: Secondary | ICD-10-CM | POA: Diagnosis not present

## 2018-10-02 NOTE — Progress Notes (Signed)
Comprehensive Clinical Assessment (CCA) Note  10/02/2018 Gabriel Dillon [id number]  Visit Diagnosis:      ICD-10-CM   1. Panic attacks F41.0       CCA Part One  Part One has been completed on paper by the patient.  (See scanned document in Chart Review)  CCA Part Two A  Intake/Chief Complaint:  CCA Intake With Chief Complaint CCA Part Two Date: 10/02/18 CCA Part Two Time: 0950 Chief Complaint/Presenting Problem: "I was going to [facility], but they wanted me to switch over. I've been having panic attacks since 1992."  Patients Currently Reported Symptoms/Problems: "Panic attacks. Plenty of anxiety. I don't ever sleep."  Collateral Involvement: Clevester Helzer, wife  Individual's Strengths: "That's a good question."  Individual's Preferences: N/A Individual's Abilities: Good communication  Type of Services Patient Feels Are Needed: medication management  Initial Clinical Notes/Concerns: Pt reported thinking he was seeing MD at today's visit, and was more interested in medication management than therapy sessions. LCSW will refer pt to MD in clinic.   Mental Health Symptoms Depression:  Depression: Change in energy/activity, Difficulty Concentrating, Fatigue, Increase/decrease in appetite, Irritability, Sleep (too much or little), Tearfulness, Weight gain/loss  Mania:  Mania: N/A  Anxiety:   Anxiety: Difficulty concentrating, Fatigue, Irritability, Sleep, Restlessness, Tension, Worrying  Psychosis:  Psychosis: N/A  Trauma:  Trauma: N/A  Obsessions:  Obsessions: N/A  Compulsions:  Compulsions: N/A  Inattention:  Inattention: N/A  Hyperactivity/Impulsivity:  Hyperactivity/Impulsivity: N/A  Oppositional/Defiant Behaviors:  Oppositional/Defiant Behaviors: N/A  Borderline Personality:  Emotional Irregularity: N/A  Other Mood/Personality Symptoms:  Other Mood/Personality Symtpoms: N/A   Mental Status Exam Appearance and self-care  Stature:  Stature: Average  Weight:  Weight: Overweight   Clothing:  Clothing: Neat/clean  Grooming:  Grooming: Normal  Cosmetic use:  Cosmetic Use: None  Posture/gait:  Posture/Gait: Normal  Motor activity:  Motor Activity: Restless  Sensorium  Attention:  Attention: Normal  Concentration:  Concentration: Normal  Orientation:  Orientation: X5  Recall/memory:  Recall/Memory: Normal  Affect and Mood  Affect:  Affect: Anxious  Mood:  Mood: Anxious  Relating  Eye contact:  Eye Contact: Normal  Facial expression:  Facial Expression: Anxious  Attitude toward examiner:  Attitude Toward Examiner: Cooperative  Thought and Language  Speech flow: Speech Flow: Normal  Thought content:  Thought Content: Appropriate to mood and circumstances  Preoccupation:  Preoccupations: (N/A)  Hallucinations:  Hallucinations: (N/A)  Organization:     Transport planner of Knowledge:  Fund of Knowledge: Average  Intelligence:  Intelligence: Average  Abstraction:  Abstraction: Normal  Judgement:  Judgement: Normal  Reality Testing:  Reality Testing: Realistic  Insight:  Insight: Good  Decision Making:  Decision Making: Normal  Social Functioning  Social Maturity:  Social Maturity: Responsible  Social Judgement:  Social Judgement: Normal  Stress  Stressors:  Stressors: Transitions  Coping Ability:  Coping Ability: Normal  Skill Deficits:     Supports:      Family and Psychosocial History: Family history Marital status: Married Number of Years Married: 11 What types of issues is patient dealing with in the relationship?: None reported Additional relationship information: N/A Are you sexually active?: No What is your sexual orientation?: Heterosexual  Has your sexual activity been affected by drugs, alcohol, medication, or emotional stress?: "Due to medication, it can be tough."  Does patient have children?: Yes How many children?: 2 How is patient's relationship with their children?: one son, one daughter. Good relationship with both.    Childhood History:  Childhood History By whom was/is the patient raised?: Both parents, Grandparents Additional childhood history information: Until age 30, lived with both parents. AFter grandfather passed away, pt went to live with grandmother.  Description of patient's relationship with caregiver when they were a child: Mom and Dad: "It was okay." Grandmother: "It was okay."  Patient's description of current relationship with people who raised him/her: Both deceased  How were you disciplined when you got in trouble as a child/adolescent?: "We always found something to get into. Spankings and things like that."  Does patient have siblings?: Yes Number of Siblings: 3 Description of patient's current relationship with siblings: two brothers, one step brother. "My youngest brother--we have a good relationship. My middle brother and I don't have a relationship at all. My stepbrother passed away."  Did patient suffer any verbal/emotional/physical/sexual abuse as a child?: No Did patient suffer from severe childhood neglect?: No Has patient ever been sexually abused/assaulted/raped as an adolescent or adult?: No Was the patient ever a victim of a crime or a disaster?: No Witnessed domestic violence?: No Has patient been effected by domestic violence as an adult?: No  CCA Part Two B  Employment/Work Situation: Employment / Work Situation Employment situation: On disability Why is patient on disability: Medical  How long has patient been on disability: Since October 2019 Patient's job has been impacted by current illness: No What is the longest time patient has a held a job?: 9 Where was the patient employed at that time?: [employer] Did You Receive Any Psychiatric Treatment/Services While in the [employer]?: (N/A) Are There Guns or Other Weapons in [place]?: Yes Types of Guns/Weapons: Guns Are These Psychologist, educational?: Yes  Education: Museum/gallery curator Currently Attending: N/A Last  Grade Completed: 11 Name of [place]: [employer] ([place]) Did Teacher, adult education From [employer]?: No Did [place]?: No Did Heritage manager?: No Did You Have Any Special Interests In School?: "Getting out."  Did You Have An Individualized Education Program (IIEP): No Did You Have Any Difficulty At School?: No  Religion: Religion/Spirituality Are You A Religious Person?: Yes What is Your Religious Affiliation?: Baptist How Might This Affect Treatment?: N/A  Leisure/Recreation: Leisure / Recreation Leisure and Hobbies: "Watch sports."   Exercise/Diet: Exercise/Diet Do You Exercise?: No Have You Gained or Lost A Significant Amount of Weight in the Past Six Months?: No Do You Follow a Special Diet?: No Do You Have Any Trouble Sleeping?: Yes Explanation of Sleeping Difficulties: Trouble falling asleep and staying asleep due to back problems and going to the bathroom.   CCA Part Two C  Alcohol/Drug Use: Alcohol / Drug Use Pain Medications: SEE MAR Prescriptions: SEE MAR Over the Counter: SEE MAR History of alcohol / drug use?: No history of alcohol / drug abuse                      CCA Part Three  ASAM's:  Six Dimensions of Multidimensional Assessment  Dimension 1:  Acute Intoxication and/or Withdrawal Potential:     Dimension 2:  Biomedical Conditions and Complications:     Dimension 3:  Emotional, Behavioral, or Cognitive Conditions and Complications:     Dimension 4:  Readiness to Change:     Dimension 5:  Relapse, Continued use, or Continued Problem Potential:     Dimension 6:  Recovery/Living Environment:      Substance use Disorder (SUD)    Social Function:  Social Functioning Social Maturity: Responsible  Social Judgement: Normal  Stress:  Stress Stressors: Transitions Coping Ability: Normal Patient Takes Medications The Way The Doctor Instructed?: Yes Priority Risk: Low Acuity  Risk Assessment- Self-Harm Potential: Risk  Assessment For Self-Harm Potential Thoughts of Self-Harm: No current thoughts Availability of Means: No access/NA Additional Comments for Self-Harm Potential: N/A  Risk Assessment -Dangerous to Others Potential: Risk Assessment For Dangerous to Others Potential Method: No Plan Availability of Means: No access or NA Intent: Vague intent or NA Notification Required: No need or identified person Additional Comments for Danger to Others Potential: N/A  DSM5 Diagnoses: Patient Active Problem List   Diagnosis Date Noted  . Chest pain 11/17/2015    Patient Centered Plan: Patient is on the following Treatment Plan(s):  Anxiety  Recommendations for Services/Supports/Treatments: Recommendations for Services/Supports/Treatments Recommendations For Services/Supports/Treatments: Individual Therapy, Medication Management  Treatment Plan Summary:  Keyston was able to speak openly about his symptoms during today's session. He reports wanting to see MD in clinic for medication management and was undecided about therapy. If he returns for therapy sessions, we will utilize CBT moving forward to assist him in managing panic attacks.   Referrals to Alternative Service(s): Referred to Alternative Service(s):   Place:   Date:   Time:    Referred to Alternative Service(s):   Place:   Date:   Time:    Referred to Alternative Service(s):   Place:   Date:   Time:    Referred to Alternative Service(s):   Place:   Date:   Time:     Alden Hipp, LCSW

## 2018-10-05 ENCOUNTER — Ambulatory Visit: Admission: RE | Admit: 2018-10-05 | Payer: Medicare Other | Source: Ambulatory Visit | Admitting: Gastroenterology

## 2018-10-05 ENCOUNTER — Encounter: Admission: RE | Payer: Self-pay | Source: Ambulatory Visit

## 2018-10-05 SURGERY — COLONOSCOPY WITH PROPOFOL
Anesthesia: General

## 2018-10-12 ENCOUNTER — Encounter: Payer: Self-pay | Admitting: *Deleted

## 2018-10-13 ENCOUNTER — Encounter: Payer: Self-pay | Admitting: Student

## 2018-10-13 ENCOUNTER — Other Ambulatory Visit: Payer: Self-pay

## 2018-10-13 ENCOUNTER — Ambulatory Visit: Payer: Medicare Other | Admitting: Certified Registered"

## 2018-10-13 ENCOUNTER — Ambulatory Visit
Admission: RE | Admit: 2018-10-13 | Discharge: 2018-10-13 | Disposition: A | Discharge status: Home / Self Care | Payer: Medicare Other | Source: Ambulatory Visit | Attending: Gastroenterology | Admitting: Gastroenterology

## 2018-10-13 ENCOUNTER — Encounter
Admission: RE | Disposition: A | Discharge status: Home / Self Care | Payer: Self-pay | Source: Ambulatory Visit | Attending: Gastroenterology

## 2018-10-13 DIAGNOSIS — D120 Benign neoplasm of cecum: Secondary | ICD-10-CM | POA: Insufficient documentation

## 2018-10-13 DIAGNOSIS — Z1211 Encounter for screening for malignant neoplasm of colon: Secondary | ICD-10-CM | POA: Diagnosis present

## 2018-10-13 DIAGNOSIS — Z87891 Personal history of nicotine dependence: Secondary | ICD-10-CM | POA: Insufficient documentation

## 2018-10-13 DIAGNOSIS — F419 Anxiety disorder, unspecified: Secondary | ICD-10-CM | POA: Insufficient documentation

## 2018-10-13 DIAGNOSIS — K635 Polyp of colon: Secondary | ICD-10-CM | POA: Insufficient documentation

## 2018-10-13 DIAGNOSIS — G20 Parkinson's disease: Secondary | ICD-10-CM | POA: Diagnosis not present

## 2018-10-13 DIAGNOSIS — Z6841 Body Mass Index (BMI) 40.0 and over, adult: Secondary | ICD-10-CM | POA: Insufficient documentation

## 2018-10-13 DIAGNOSIS — Z79899 Other long term (current) drug therapy: Secondary | ICD-10-CM | POA: Diagnosis not present

## 2018-10-13 DIAGNOSIS — D123 Benign neoplasm of transverse colon: Secondary | ICD-10-CM | POA: Diagnosis not present

## 2018-10-13 DIAGNOSIS — K621 Rectal polyp: Secondary | ICD-10-CM | POA: Diagnosis not present

## 2018-10-13 DIAGNOSIS — K5730 Diverticulosis of large intestine without perforation or abscess without bleeding: Secondary | ICD-10-CM | POA: Insufficient documentation

## 2018-10-13 DIAGNOSIS — F410 Panic disorder [episodic paroxysmal anxiety] without agoraphobia: Secondary | ICD-10-CM | POA: Diagnosis not present

## 2018-10-13 HISTORY — DX: Myoneural disorder, unspecified: G70.9

## 2018-10-13 HISTORY — DX: Gout, unspecified: M10.9

## 2018-10-13 HISTORY — DX: Parkinson's disease: G20

## 2018-10-13 HISTORY — DX: Parkinson's disease without dyskinesia, without mention of fluctuations: G20.A1

## 2018-10-13 HISTORY — DX: Palpitations: R00.2

## 2018-10-13 HISTORY — DX: Fatty (change of) liver, not elsewhere classified: K76.0

## 2018-10-13 HISTORY — PX: COLONOSCOPY WITH PROPOFOL: SHX5780

## 2018-10-13 HISTORY — DX: Allergic rhinitis, unspecified: J30.9

## 2018-10-13 HISTORY — DX: Calculus of kidney: N20.0

## 2018-10-13 SURGERY — COLONOSCOPY WITH PROPOFOL
Anesthesia: General

## 2018-10-13 MED ORDER — PROPOFOL 10 MG/ML IV BOLUS
INTRAVENOUS | Status: DC | PRN
  Administered 2018-10-13: 20 mg via INTRAVENOUS
  Administered 2018-10-13: 30 mg via INTRAVENOUS
  Administered 2018-10-13: 50 mg via INTRAVENOUS

## 2018-10-13 MED ORDER — MIDAZOLAM HCL 2 MG/2ML IJ SOLN
INTRAMUSCULAR | Status: AC
  Filled 2018-10-13: qty 2

## 2018-10-13 MED ORDER — SODIUM CHLORIDE 0.9 % IV SOLN
INTRAVENOUS | Status: DC
  Administered 2018-10-13: 14:00:00 via INTRAVENOUS

## 2018-10-13 MED ORDER — PHENYLEPHRINE HCL 10 MG/ML IJ SOLN
INTRAMUSCULAR | Status: DC | PRN
  Administered 2018-10-13: 50 ug via INTRAVENOUS

## 2018-10-13 MED ORDER — MIDAZOLAM HCL 2 MG/2ML IJ SOLN
INTRAMUSCULAR | Status: DC | PRN
  Administered 2018-10-13: 1 mg via INTRAVENOUS

## 2018-10-13 MED ORDER — PROPOFOL 500 MG/50ML IV EMUL
INTRAVENOUS | Status: AC
  Filled 2018-10-13: qty 50

## 2018-10-13 MED ORDER — LIDOCAINE HCL (PF) 2 % IJ SOLN
INTRAMUSCULAR | Status: AC
  Filled 2018-10-13: qty 10

## 2018-10-13 MED ORDER — PROPOFOL 500 MG/50ML IV EMUL
INTRAVENOUS | Status: DC | PRN
  Administered 2018-10-13: 100 ug/kg/min via INTRAVENOUS

## 2018-10-13 NOTE — H&P (Signed)
Outpatient short stay form Pre-procedure 10/13/2018 2:42 PM Lollie Sails MD  Primary Physician: Genene Churn  Reason for visit: Colon cancer screening  History of present illness: Patient is a 63 year old male presenting today for colonoscopy.  This is his first colonoscopy.  He tolerated his prep well.  He takes no aspirin or blood thinning agent with the exception of 81 mg aspirin that was held.  No family history of colon cancer or colon polyps.  Patient has no problems with diarrhea blood in the stool or abdominal pain.    Current Facility-Administered Medications:  .  0.9 %  sodium chloride infusion, , Intravenous, Continuous, Lollie Sails, MD, Last Rate: 20 mL/hr at 10/13/18 1331  Medications Prior to Admission  Medication Sig Dispense Refill Last Dose  . carbidopa-levodopa (SINEMET IR) 25-250 MG tablet Take 1 tablet by mouth 3 (three) times daily.   10/13/2018 at 0600  . clonazePAM (KLONOPIN) 0.5 MG tablet Take 1 tablet by mouth 2 (two) times daily.  0 10/13/2018 at 0600  . docusate sodium (COLACE) 100 MG capsule Take 100 mg by mouth 2 (two) times daily.   10/12/2018 at Unknown time  . gabapentin (NEURONTIN) 600 MG tablet Take 600 mg by mouth at bedtime.   10/12/2018 at Unknown time  . rOPINIRole (REQUIP) 0.5 MG tablet Take 0.5 mg by mouth at bedtime.   Past Week at Unknown time  . acetaminophen (TYLENOL) 500 MG tablet Take 1-2 tablets by mouth every 6 (six) hours as needed.   Not Taking at Unknown time  . aspirin 81 MG chewable tablet Chew 1 tablet (81 mg total) by mouth daily. (Patient not taking: Reported on 10/13/2018) 90 tablet 0 Not Taking at Unknown time  . buPROPion (WELLBUTRIN) 100 MG tablet Take 1 tablet by mouth daily.  0 11/16/2015 at Unknown time  . cyclobenzaprine (FLEXERIL) 10 MG tablet Take 1 tablet by mouth 2 (two) times daily as needed.  0 Not Taking at Unknown time     Allergies  Allergen Reactions  . Ibuprofen Anaphylaxis  . Penicillins Anaphylaxis   Has patient had a PCN reaction causing immediate rash, facial/tongue/throat swelling, SOB or lightheadedness with hypotension: Yes Has patient had a PCN reaction causing severe rash involving mucus membranes or skin necrosis: Yes Has patient had a PCN reaction that required hospitalization Yes Has patient had a PCN reaction occurring within the last 10 years: Yes If all of the above answers are "NO", then may proceed with Cephalosporin use.   . Citalopram     dizziness   . Aspirin Palpitations     Past Medical History:  Diagnosis Date  . Allergic rhinitis   . Back pain   . Enlarged prostate   . Fatty liver   . Gout   . Nephrolithiasis   . Neuromuscular disorder ([place])   . Palpitation   . Panic attacks   . Panic attacks   . Parkinson's disease ([place])     Review of systems:      Physical Exam    Heart and lungs: Regular rate and rhythm without rub or gallop, lungs are bilaterally clear.    HEENT: Normocephalic atraumatic eyes are anicteric    Other:    Pertinant exam for procedure: Soft nontender nondistended bowel sounds positive normoactive    Planned proceedures: Colonoscopy and indicated procedures. I have discussed the risks benefits and complications of procedures to include not limited to bleeding, infection, perforation and the risk of sedation and the patient wishes  to proceed.    Lollie Sails, MD Gastroenterology 10/13/2018  2:42 PM

## 2018-10-13 NOTE — Anesthesia Preprocedure Evaluation (Addendum)
Anesthesia Evaluation  Patient identified by MRN, date of birth, ID band Patient awake    Reviewed: Allergy & Precautions, NPO status , Patient's Chart, lab work & pertinent test results  History of Anesthesia Complications Negative for: history of anesthetic complications  Airway Mallampati: III       Dental   Pulmonary neg sleep apnea, neg COPD, former smoker,           Cardiovascular (-) hypertension(-) Past MI and (-) CHF (-) dysrhythmias (-) Valvular Problems/Murmurs     Neuro/Psych neg Seizures Anxiety    GI/Hepatic Neg liver ROS, neg GERD  ,  Endo/Other  neg diabetesMorbid obesity  Renal/GU Renal disease (stones)     Musculoskeletal   Abdominal   Peds  Hematology   Anesthesia Other Findings   Reproductive/Obstetrics                            Anesthesia Physical Anesthesia Plan  ASA: II  Anesthesia Plan: General   Post-op Pain Management:    Induction: Intravenous  PONV Risk Score and Plan: 2 and Propofol infusion and TIVA  Airway Management Planned: Nasal Cannula  Additional Equipment:   Intra-op Plan:   Post-operative Plan:   Informed Consent: I have reviewed the patients History and Physical, chart, labs and discussed the procedure including the risks, benefits and alternatives for the proposed anesthesia with the patient or authorized representative who has indicated his/her understanding and acceptance.       Plan Discussed with:   Anesthesia Plan Comments:         Anesthesia Quick Evaluation

## 2018-10-13 NOTE — Anesthesia Postprocedure Evaluation (Signed)
Anesthesia Post Note  Patient: Gabriel Dillon  Procedure(s) Performed: COLONOSCOPY WITH PROPOFOL (N/A )  Patient location during evaluation: Endoscopy Anesthesia Type: General Level of consciousness: awake and alert Pain management: pain level controlled Vital Signs Assessment: post-procedure vital signs reviewed and stable Respiratory status: spontaneous breathing and respiratory function stable Cardiovascular status: stable Anesthetic complications: no     Last Vitals:  Vitals:   10/13/18 1620 10/13/18 1630  BP: 110/82 (!) 163/88  Pulse: 79   Resp: 14   Temp: (!) 36.3 C   SpO2: 95%     Last Pain:  Vitals:   10/13/18 1630  TempSrc:   PainSc: 0-No pain                 KEPHART,WILLIAM K

## 2018-10-13 NOTE — Transfer of Care (Signed)
Immediate Anesthesia Transfer of Care Note  Patient: Gabriel Dillon  Procedure(s) Performed: COLONOSCOPY WITH PROPOFOL (N/A )  Patient Location: PACU  Anesthesia Type:General  Level of Consciousness: awake, alert  and oriented  Airway & Oxygen Therapy: Patient Spontanous Breathing and Patient connected to nasal cannula oxygen  Post-op Assessment: Report given to RN and Post -op Vital signs reviewed and stable  Post vital signs: Reviewed and stable  Last Vitals:  Vitals Value Taken Time  BP 110/82 10/13/2018  4:20 PM  Temp 36.3 C 10/13/2018  4:20 PM  Pulse 79 10/13/2018  4:20 PM  Resp 14 10/13/2018  4:20 PM  SpO2 95 % 10/13/2018  4:20 PM    Last Pain:  Vitals:   10/13/18 1620  TempSrc:   PainSc: 0-No pain         Complications: No apparent anesthesia complications

## 2018-10-13 NOTE — Op Note (Signed)
[facility] Gastroenterology Patient Name: Gabriel Dillon Procedure Date: 10/13/2018 2:47 PM MRN: [id number] Account #: [id number] Date of Birth: Feb 14, 1956 Admit Type: Outpatient Age: 63 Room: [facility] ENDO ROOM 3 Gender: Male Note Status: Finalized Procedure:            Colonoscopy Indications:          Screening for colorectal malignant neoplasm, This is                        the patient's first colonoscopy Providers:            Lollie Sails, MD Referring MD:         Christena Flake. Raechel Ache, MD (Referring MD) Medicines:            Monitored Anesthesia Care Complications:        No immediate complications. Procedure:            Pre-Anesthesia Assessment:                       - ASA Grade Assessment: III - A patient with severe                        systemic disease.                       After obtaining informed consent, the colonoscope was                        passed under direct vision. Throughout the procedure,                        the patient's blood pressure, pulse, and oxygen                        saturations were monitored continuously. The was                        introduced through the anus and advanced to the the                        cecum, identified by appendiceal orifice and ileocecal                        valve. The colonoscopy was unusually difficult due to                        significant looping. Successful completion of the                        procedure was aided by changing the patient to a supine                        position, changing the patient to a prone position and                        using manual pressure. The patient tolerated the                        procedure well. The quality of the bowel preparation  was good. Findings:      A 4 mm polyp was found in the recto-sigmoid colon. The polyp was       sessile. The polyp was removed with a cold snare. Resection and       retrieval were complete.    Three sessile and semi-pedunculated polyps were found in the hepatic       flexure. The polyps were 1 to 10 mm in size. These polyps were removed       with a cold snare. Resection and retrieval were complete.      A 4 mm polyp was found in the ileocecal valve. The polyp was sessile.       The polyp was removed with a piecemeal technique using a cold biopsy       forceps. Resection and retrieval were complete.      A 2 mm polyp was found in the cecum. The polyp was sessile. The polyp       was removed with a cold biopsy forceps. Resection and retrieval were       complete.      A 4 mm polyp was found in the transverse colon. The polyp was sessile.       The polyp was removed with a cold snare. Resection and retrieval were       complete.      A 3 mm polyp was found in the rectum. The polyp was sessile. The polyp       was removed with a cold snare. Resection and retrieval were complete.      Multiple medium-mouthed diverticula were found in the sigmoid colon and       distal descending colon. Impression:           - One 4 mm polyp at the recto-sigmoid colon, removed                        with a cold snare. Resected and retrieved.                       - Three 3 to 8 mm polyps at the hepatic flexure,                        removed with a cold snare. Resected and retrieved.                       - One 4 mm polyp at the ileocecal valve, removed                        piecemeal using a cold biopsy forceps. Resected and                        retrieved.                       - One 2 mm polyp in the cecum, removed with a cold                        biopsy forceps. Resected and retrieved.                       - One 4 mm polyp in the transverse colon, removed with  a cold snare. Resected and retrieved.                       - One 3 mm polyp in the rectum, removed with a cold                        snare. Resected and retrieved.                       - Diverticulosis in  the sigmoid colon and in the distal                        descending colon. Recommendation:       - Discharge patient to home.                       - Telephone GI clinic for pathology results in 1 week. Procedure Code(s):    --- Professional ---                       [phone], Colonoscopy, flexible; with removal of tumor(s),                        polyp(s), or other lesion(s) by snare technique                       [id number], 64, Colonoscopy, flexible; with biopsy, single                        or multiple Diagnosis Code(s):    --- Professional ---                       Z12.11, Encounter for screening for malignant neoplasm                        of colon                       D12.7, Benign neoplasm of rectosigmoid junction                       D12.0, Benign neoplasm of cecum                       D12.3, Benign neoplasm of transverse colon (hepatic                        flexure or splenic flexure)                       K62.1, Rectal polyp                       K57.30, Diverticulosis of large intestine without                        perforation or abscess without bleeding CPT copyright 2018 American Medical Association. All rights reserved. The codes documented in this report are preliminary and upon coder review may  be revised to meet current compliance requirements. Lollie Sails, MD 10/13/2018 4:17:14 PM This report has been signed electronically. Number of Addenda: 0 Note Initiated On: 10/13/2018 2:47 PM Scope Withdrawal Time:  0 hours 39 minutes 28 seconds  Total Procedure Duration: 1 hour 10 minutes 13 seconds       [facility]

## 2018-10-13 NOTE — Anesthesia Post-op Follow-up Note (Signed)
Anesthesia QCDR form completed.        

## 2018-10-14 ENCOUNTER — Encounter: Payer: Self-pay | Admitting: Gastroenterology

## 2018-10-15 LAB — SURGICAL PATHOLOGY

## 2018-10-22 ENCOUNTER — Ambulatory Visit
Admission: RE | Admit: 2018-10-22 | Discharge: 2018-10-22 | Disposition: A | Discharge status: Home / Self Care | Payer: Medicare Other | Source: Ambulatory Visit | Attending: Gastroenterology | Admitting: Gastroenterology

## 2018-10-22 ENCOUNTER — Other Ambulatory Visit: Payer: Self-pay

## 2018-10-22 ENCOUNTER — Other Ambulatory Visit: Payer: Self-pay | Admitting: Gastroenterology

## 2018-10-22 DIAGNOSIS — R1084 Generalized abdominal pain: Secondary | ICD-10-CM

## 2018-10-30 ENCOUNTER — Ambulatory Visit: Payer: Medicare Other | Admitting: Psychiatry

## 2018-12-09 ENCOUNTER — Other Ambulatory Visit: Payer: Self-pay

## 2018-12-09 ENCOUNTER — Ambulatory Visit (INDEPENDENT_AMBULATORY_CARE_PROVIDER_SITE_OTHER): Payer: Medicare Other | Admitting: Psychiatry

## 2018-12-09 ENCOUNTER — Encounter: Payer: Self-pay | Admitting: Psychiatry

## 2018-12-09 DIAGNOSIS — F411 Generalized anxiety disorder: Secondary | ICD-10-CM

## 2018-12-09 DIAGNOSIS — F410 Panic disorder [episodic paroxysmal anxiety] without agoraphobia: Secondary | ICD-10-CM

## 2018-12-09 DIAGNOSIS — G4700 Insomnia, unspecified: Secondary | ICD-10-CM

## 2018-12-09 MED ORDER — MIRTAZAPINE 15 MG PO TABS: 15.0000 mg | ORAL_TABLET | Freq: Every day | ORAL | 0 refills | Status: DC

## 2018-12-09 NOTE — Progress Notes (Signed)
Virtual Visit via Video Note  I connected with Gabriel Dillon on 12/09/18 at  9:00 AM EDT by a video enabled telemedicine application and verified that I am speaking with the correct person using two identifiers.   I discussed the limitations of evaluation and management by telemedicine and the availability of in person appointments. The patient expressed understanding and agreed to proceed.   I discussed the assessment and treatment plan with the patient. The patient was provided an opportunity to ask questions and all were answered. The patient agreed with the plan and demonstrated an understanding of the instructions.   The patient was advised to call back or seek an in-person evaluation if the symptoms worsen or if the condition fails to improve as anticipated.    Psychiatric Initial Adult Assessment   Patient Identification: Gabriel Dillon MRN:  [id number] Date of Evaluation:  12/09/2018 Referral Source: Gabriel Kayser MD/ Gabriel Herb NP Chief Complaint:   Chief Complaint    Establish Care; Anxiety     Visit Diagnosis:    ICD-10-CM   1. Panic disorder F41.0 mirtazapine (REMERON) 15 MG tablet  2. GAD (generalized anxiety disorder) F41.1   3. Insomnia, unspecified type G47.00     History of Present Illness:  Gabriel Dillon is is a 63 year old Caucasian male, married, lives in [place], has a history of anxiety, Parkinson's disease, restless leg syndrome, chronic pain, BPH, was evaluated by telemedicine today.  Patient was referred to the clinic by his neurologist.  Patient used to follow-up with [facility] previously.  Patient reports he was treated for panic attacks and also may be depression previously.  He has been tried on several different medications, he does not clearly remember all the names.  He reports these medications gave him side effects or did not help him.  He reports he stopped going to [place] several months ago.  He reports he was referred to the clinic here  for management of his anxiety symptoms.  Patient describes his anxiety symptoms as racing heart rate, shortness of breath, inability to focus, inability to sit still which can happen 2-3 times a week.  When it happens it happens without a trigger and can last for 15 to 20 minutes.  Patient reports he tries to relax by deep breathing and staying away from certain situations like crowded places.  Patient reports he takes Klonopin 3 times a day and has been taking it since the past 20 years.  He has also tried medications like Valium previously.  Patient was recently seen by our therapist Gabriel Dillon however he reports he never went back and was not sure whether he had to go back or not.  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  He describes feeling nervous, anxious, trouble relaxing, feeling afraid something awful might happen especially with the COVID-19 outbreak.  This has been going on since the past several years and may be worsening.  Patient does report a history of depressive symptoms previously currently denies it.  Patient denies any suicidality.  Patient denies any suicide attempts.  Patient denies any homicidality.  Patient denies any perceptual disturbances.  Patient reports sleep problems.  He reports he has difficulty staying asleep.  He reports it is mostly because of his BPH and he has to urinate several times at night and this interrupts his sleep.  Patient reports he has never tried medications for sleep previously.  Patient does have multiple medical problems including Parkinson's disease.  Patient is under the care of his neurologist and takes carbidopa-levodopa for the same.  Patient also struggles with chronic pain.  Patient has good social support from his wife who lives with him.  He is on disability from his work.    Associated Signs/Symptoms: Depression Symptoms:  anxiety, panic attacks, disturbed sleep, (Hypo) Manic Symptoms:   Denies Anxiety Symptoms:  Excessive Worry, Panic Symptoms, Psychotic Symptoms:  denies PTSD Symptoms: Negative  Past Psychiatric History: Denies inpatient mental health admissions.  Patient denies suicide attempts.  Patient was under the care of [facility] previously.    Previous Psychotropic Medications: Yes -Klonopin, Valium, Zoloft-sexual side effects, Celexa, Lexapro, BuSpar-several other medications he reports he does not know the names,wellbutrin  Substance Abuse History in the last 12 months:  No.  Consequences of Substance Abuse: Negative  Past Medical History:  Past Medical History:  Diagnosis Date  . Allergic rhinitis   . Back pain   . Enlarged prostate   . Fatty liver   . Gout   . Nephrolithiasis   . Neuromuscular disorder ([place])   . Palpitation   . Panic attacks   . Panic attacks   . Parkinson's disease [facility])     Past Surgical History:  Procedure Laterality Date  . APPENDECTOMY    . COLONOSCOPY WITH PROPOFOL N/A 10/13/2018   Procedure: COLONOSCOPY WITH PROPOFOL;  Surgeon: Gabriel Sails, MD;  Location: [facility] ENDOSCOPY;  Service: Endoscopy;  Laterality: N/A;  . TONSILLECTOMY      Family Psychiatric History: Patient denies history of mental health problems in his family.  Family History:  Family History  Problem Relation Age of Onset  . Mental illness Neg Hx     Social History:   Social History   Socioeconomic History  . Marital status: Married    Spouse name: Gabriel Dillon  . Number of children: 2  . Years of education: Not on file  . Highest education level: High school graduate  Occupational History  . Not on file  Social Needs  . Financial resource strain: Not hard at all  . Food insecurity:    Worry: Never true    Inability: Never true  . Transportation needs:    Medical: No    Non-medical: No  Tobacco Use  . Smoking status: Former Smoker    Packs/day: 2.00    Years: 10.00    Pack years: 20.00    Last attempt to quit:  10/17/1992    Years since quitting: 26.1  . Smokeless tobacco: Never Used  Substance and Sexual Activity  . Alcohol use: No  . Drug use: No  . Sexual activity: Not Currently  Lifestyle  . Physical activity:    Days per week: 0 days    Minutes per session: 0 min  . Stress: Very much  Relationships  . Social connections:    Talks on phone: Not on file    Gets together: Not on file    Attends religious service: More than 4 times per year    Active member of club or organization: No    Attends meetings of clubs or organizations: Never    Relationship status: Married  Other Topics Concern  . Not on file  Social History Narrative  . Not on file    Additional Social History: Married.  Patient has 2 children a son and a daughter.  He has good relationship with both.  He was raised by both parents, grandparents.  Until age 71 he lived with  both parents.  After grandfather passed away he went to live with grandmother.  Patient has 3 siblings.  Patient is currently on disability.  He has worked with [employer] previously.  Allergies:   Allergies  Allergen Reactions  . Ibuprofen Anaphylaxis  . Penicillins Anaphylaxis    Has patient had a PCN reaction causing immediate rash, facial/tongue/throat swelling, SOB or lightheadedness with hypotension: Yes Has patient had a PCN reaction causing severe rash involving mucus membranes or skin necrosis: Yes Has patient had a PCN reaction that required hospitalization Yes Has patient had a PCN reaction occurring within the last 10 years: Yes If all of the above answers are "NO", then may proceed with Cephalosporin use.   . Prednisone Palpitations  . Citalopram     dizziness   . Aspirin Palpitations    Metabolic Disorder Labs: No results found for: HGBA1C, MPG No results found for: PROLACTIN Lab Results  Component Value Date   CHOL 178 11/18/2015   TRIG 167 (H) 11/18/2015   HDL 34 (L) 11/18/2015   CHOLHDL 5.2 11/18/2015   VLDL 33 11/18/2015    LDLCALC 111 (H) 11/18/2015   No results found for: TSH  Therapeutic Level Labs: No results found for: LITHIUM No results found for: CBMZ No results found for: VALPROATE  Current Medications: Current Outpatient Medications  Medication Sig Dispense Refill  . acetaminophen (TYLENOL) 500 MG tablet Take 1-2 tablets by mouth every 6 (six) hours as needed.    . carbidopa-levodopa (SINEMET IR) 25-250 MG tablet Take 1 tablet by mouth 3 (three) times daily.    . clonazePAM (KLONOPIN) 0.5 MG tablet Take 1 tablet by mouth 3 (three) times daily.  0  . docusate sodium (COLACE) 100 MG capsule Take 100 mg by mouth 2 (two) times daily.    Marland Kitchen gabapentin (NEURONTIN) 600 MG tablet Take 600 mg by mouth at bedtime.    Marland Kitchen rOPINIRole (REQUIP) 0.5 MG tablet Take 0.5 mg by mouth at bedtime.    . vitamin B-12 (CYANOCOBALAMIN) 1000 MCG tablet Take by mouth.    . mirtazapine (REMERON) 15 MG tablet Take 1 tablet (15 mg total) by mouth at bedtime. Anxiety and sleep 30 tablet 0   No current facility-administered medications for this visit.     Musculoskeletal: Strength & Muscle Tone: UTA Gait & Station: UTA Patient leans: N/A  Psychiatric Specialty Exam: Review of Systems  Psychiatric/Behavioral: The patient is nervous/anxious and has insomnia.   All other systems reviewed and are negative.   There were no vitals taken for this visit.There is no height or weight on file to calculate BMI.  General Appearance: Casual  Eye Contact:  Fair  Speech:  Clear and Coherent  Volume:  Decreased  Mood:  Anxious  Affect:  Appropriate  Thought Process:  Goal Directed and Descriptions of Associations: Intact  Orientation:  Full (Time, Place, and Person)  Thought Content:  Logical  Suicidal Thoughts:  No  Homicidal Thoughts:  No  Memory:  Immediate;   Fair Recent;   Fair Remote;   Fair  Judgement:  Fair  Insight:  Fair  Psychomotor Activity:  Normal  Concentration:  Concentration: Fair and Attention Span: Fair   Recall:  [employer] of Knowledge:Fair  Language: Fair  Akathisia:  No  Handed:  Right  AIMS (if indicated): UTA   Assets:  Communication Skills Desire for Improvement Social Support  ADL's:  Intact  Cognition: WNL  Sleep:  Poor   Screenings:   Assessment and Plan:  Jesiah is a 63 year old Caucasian male, married, on disability, lives in [place], has a history of anxiety, restless leg syndrome, Parkinson's disease, BPH, chronic pain, was evaluated by telemedicine today.  Patient is biologically predisposed given his history of chronic medical problems.  Patient also has psychosocial stressors of the physical limitations due to his Parkinson's disease as well as the current COVID-19 outbreak.  Patient will benefit from medication changes as well as psychotherapy sessions.  Plan as noted below.  Plan For panic disorder- currently stable Discussed with patient that Klonopin is not good for long-term treatment and that he needs to be tapered down.  Discussed risk including cognitive changes, confusion with long-term use of benzodiazepine.  Provided him instructions to taper it down. Discussed with him to taper it off at bedtime gradually. Week 1- take only half dose at bedtime on Thursday. Week 2- take only half dose on Tuesdays and Thursdays at bedtime. Week 3-take only half dose on Tuesday Thursday and Saturdays at bedtime. All the other dosages he will continue the same as usual. No changes. Discussed with patient to start psychotherapy sessions on a frequent basis.  He will start frequent therapy sessions with Gabriel Dillon here in this clinic.  For GAD- unstable Start mirtazapine 15 mg at bedtime. Start CBT.  For insomnia- unstable His insomnia is also due to his BPH and frequent urination. Mirtazapine will help.  Will benefit from the following labs-TSH if not already done.  I have reviewed medical records in [facility] per Gabriel Dillon dated 10/02/2018.  I have  reviewed medical records per neurologist NP. Caryl Pina stepp-' patient with Parkinson's disease- currently on carbidopa levodopa.  For restless leg syndrome-gabapentin 1200 mg and Requip 1 mg.  For anxiety- patient continues to take clonazepam 3 times a day and was recently started on Wellbutrin.  Patient states he continues to not see psychiatry however his schedule for follow-up on 09/14/2018. Patient with moderate Parkinson's disease, tremors worse in the arms and feet.  Increase Sinemet to 50/256 times daily.  Refer patient to a movement specialist for brain stimulator consultation.  Advised patient to receive a CPAP titration as soon as possible.  Taking clonazepam 0.5 mg 3 times a day.'  Follow-up in clinic in 2 weeks or sooner if needed.  I have spent atleast 45 MINUTES non face to face with patient today. More than 50 % of the time was spent for psychoeducation and supportive psychotherapy and care coordination.  This note was generated in part or whole with voice recognition software. Voice recognition is usually quite accurate but there are transcription errors that can and very often do occur. I apologize for any typographical errors that were not detected and corrected.          Ursula Alert, MD 4/22/202012:59 PM

## 2018-12-09 NOTE — Patient Instructions (Signed)
Taper off Klonopin  Week 1 - Take only half tablet of Klonopin at bedtime on Thursday. Continue the 0.5 mg morning and noon dosage.  Week 2 - Take only half tablet Klonopin at bedtime on Tuesday and Thursday. Continue 0.5 mg am and noon.  Week 3 - Take only half tablet Klonopin at bedtime on Tuesday, Thursday and Saturday. Continue 0.5 mg AM and noon.  All other days continue your usual dosage - no changes .

## 2018-12-09 NOTE — Progress Notes (Signed)
TC on  12-09-18 @ 8:35 pt medical and surgical history were review with no changes. Pt medications and pharmacy was reviewed with no changes. Pt medical hx was reviewed with no changes.  No vital taken because this is a phone visit.

## 2018-12-21 ENCOUNTER — Ambulatory Visit (INDEPENDENT_AMBULATORY_CARE_PROVIDER_SITE_OTHER): Payer: Medicare Other | Admitting: Licensed Clinical Social Worker

## 2018-12-21 ENCOUNTER — Encounter: Payer: Self-pay | Admitting: Licensed Clinical Social Worker

## 2018-12-21 ENCOUNTER — Other Ambulatory Visit: Payer: Self-pay

## 2018-12-21 DIAGNOSIS — F410 Panic disorder [episodic paroxysmal anxiety] without agoraphobia: Secondary | ICD-10-CM

## 2018-12-21 NOTE — Progress Notes (Signed)
Virtual Visit via Telephone Note  I connected with Gabriel Dillon on 12/21/18 at  9:00 AM EDT by telephone and verified that I am speaking with the correct person using two identifiers.   I discussed the limitations, risks, security and privacy concerns of performing an evaluation and management service by telephone and the availability of in person appointments. I also discussed with the patient that there may be a patient responsible charge related to this service. The patient expressed understanding and agreed to proceed.    I discussed the assessment and treatment plan with the patient. The patient was provided an opportunity to ask questions and all were answered. The patient agreed with the plan and demonstrated an understanding of the instructions.   The patient was advised to call back or seek an in-person evaluation if the symptoms worsen or if the condition fails to improve as anticipated.  I provided 20 minutes of non-face-to-face time during this encounter.   Alden Hipp, LCSW    THERAPIST PROGRESS NOTE  Session Time: 0900  Participation Level: Minimal  Behavioral Response: NAAlertAnxious  Type of Therapy: Individual Therapy  Treatment Goals addressed: Anxiety  Interventions: CBT  Summary: Gabriel Dillon is a 63 y.o. male who presents with continued symptoms of his diagnosis. Molly reports doing well since our last session. He reports increased anxiety due to COVID-19 and being "stuck inside. These four walls start to close in on you eventually." LCSW validated and normalized these feelings and highlighted that most people are feeling the same way right now--though it is easy to feel isolated in our feelings. Lautaro expressed agreement with that sentiment. Iseah reported he and his wife are getting along well despite being quarantined at home together, "we've been getting along real good." LCSW encouraged Andron to utilize his wife as support at times when his anxiety is  increased. Korde expressed agreement, but added at times it is difficult to discuss his anxiety. LCSW normalized those feelings, but encouraged Heron to recognize the ways his wife has been supportive in the past. Hristopher reported he has been utilizing breathing exercises and limiting his news intake in order to decrease his anxiety over the last month. LCSW validated those mechanisms to manage anxiety, and encouraged Teagan to also utilize challenging negative thoughts. This led to a discussion around CBT and how changing our thoughts can eventually change our feelings and behaviors. Antwoine expressed understanding and reported, "the hard part is just remembering to do it." LCSW validated that idea, and encouraged Child not to be hard on himself if he forgets to use this strategy frequently, as with any new skill it takes time to remember during stressful events.   Suicidal/Homicidal: No  Therapist Response: Manny continues to work towards his tx goals but has not yet reached them. He reports doing well since our last session, but reports increased anxiety at times. We will continue to work on emotional regulation skills by utilizing CBT moving forward. Nasiah reported wanting to have therapy "every couple months," instead of on a regular basis. LCSW explained the benefits of regular therapy, but emphasized it was ultimately up to the client.   Plan: Return again in 8 weeks.  Diagnosis: Axis I: Panic Disorder    Axis II: No diagnosis    Alden Hipp, LCSW 12/21/2018

## 2018-12-28 ENCOUNTER — Ambulatory Visit (INDEPENDENT_AMBULATORY_CARE_PROVIDER_SITE_OTHER): Payer: Medicare Other | Admitting: Psychiatry

## 2018-12-28 ENCOUNTER — Other Ambulatory Visit: Payer: Self-pay

## 2018-12-28 ENCOUNTER — Encounter: Payer: Self-pay | Admitting: Psychiatry

## 2018-12-28 DIAGNOSIS — F411 Generalized anxiety disorder: Secondary | ICD-10-CM | POA: Diagnosis not present

## 2018-12-28 DIAGNOSIS — F410 Panic disorder [episodic paroxysmal anxiety] without agoraphobia: Secondary | ICD-10-CM | POA: Diagnosis not present

## 2018-12-28 DIAGNOSIS — G4700 Insomnia, unspecified: Secondary | ICD-10-CM | POA: Diagnosis not present

## 2018-12-28 MED ORDER — PROPRANOLOL HCL 10 MG PO TABS: 10.0000 mg | ORAL_TABLET | Freq: Three times a day (TID) | ORAL | 1 refills | Status: DC | PRN

## 2018-12-28 MED ORDER — MIRTAZAPINE 15 MG PO TABS: 22.5000 mg | ORAL_TABLET | Freq: Every day | ORAL | 1 refills | Status: DC

## 2018-12-28 NOTE — Patient Instructions (Addendum)
Week 1 - Take only half tablet of Klonopin at night.  Week 2 - Take only half tablet of klonopin in the afternoon on Wednesday and continue half tablets only at night.  Week 3 - Take only half tablet Klonopin in the afternoon on Wednesday and Friday and continue half tablets night.  Week 4 -Take only half tablet of klonopin in the afternoon on Monday, Wednesday , Friday and half tablets every night.

## 2018-12-28 NOTE — Progress Notes (Signed)
Virtual Visit via Video Note  I connected with Gabriel Dillon on 12/28/18 at  2:00 PM EDT by a video enabled telemedicine application and verified that I am speaking with the correct person using two identifiers.   I discussed the limitations of evaluation and management by telemedicine and the availability of in person appointments. The patient expressed understanding and agreed to proceed.   I discussed the assessment and treatment plan with the patient. The patient was provided an opportunity to ask questions and all were answered. The patient agreed with the plan and demonstrated an understanding of the instructions.   The patient was advised to call back or seek an in-person evaluation if the symptoms worsen or if the condition fails to improve as anticipated.   [facility] MD OP Progress Note  12/28/2018 3:31 PM Gabriel Dillon  MRN:  [id number]  Chief Complaint:  Chief Complaint    Follow-up     HPI: Gabriel Dillon is a 63 year old Caucasian male, married, lives in [place], history of panic attacks, Parkinson's disease, restless leg syndrome, chronic pain, BPH was evaluated by telemedicine today.  Patient reports he continues to struggle with some anxiety attacks on and off.  He is compliant on the mirtazapine.  He has not noticed much benefit for the anxiety symptoms yet.  He reports it helped him to rest at night for a couple of nights.  He however continues to wake up at night since he also has BPH and has to urinate at least 2-3 times at night.  He reports he is tapering off of the Klonopin as recommended last visit.  Patient reports ever since he went down on the Klonopin dosage to half tablet at night he has noticed some worsening restless leg syndrome.  He reports his other provider increased his Requip to 2 mg at bedtime.  He has not started that dosage yet.  Patient denies any suicidality, homicidality or perceptual disturbances.  Patient denies any other concerns today. Visit  Diagnosis:    ICD-10-CM   1. Panic disorder F41.0 mirtazapine (REMERON) 15 MG tablet    propranolol (INDERAL) 10 MG tablet  2. GAD (generalized anxiety disorder) F41.1 propranolol (INDERAL) 10 MG tablet  3. Insomnia, unspecified type G47.00     Past Psychiatric History: I have reviewed past psychiatric history from my progress note on 12/09/2018.  Past trials of Klonopin, Valium, Zoloft-sexual side effects, Celexa, Lexapro, BuSpar, Wellbutrin  Past Medical History:  Past Medical History:  Diagnosis Date  . Allergic rhinitis   . Back pain   . Enlarged prostate   . Fatty liver   . Gout   . Nephrolithiasis   . Neuromuscular disorder ([place])   . Palpitation   . Panic attacks   . Panic attacks   . Parkinson's disease [facility])     Past Surgical History:  Procedure Laterality Date  . APPENDECTOMY    . COLONOSCOPY WITH PROPOFOL N/A 10/13/2018   Procedure: COLONOSCOPY WITH PROPOFOL;  Surgeon: Lollie Sails, MD;  Location: [facility] ENDOSCOPY;  Service: Endoscopy;  Laterality: N/A;  . TONSILLECTOMY      Family Psychiatric History: I have reviewed family psychiatric history from my progress note on 12/09/2018  Family History:  Family History  Problem Relation Age of Onset  . Mental illness Neg Hx     Social History: I have reviewed social history from my progress note on 12/09/2018 Social History   Socioeconomic History  . Marital status: Married    Spouse name: teresa  . Number  of children: 2  . Years of education: Not on file  . Highest education level: High school graduate  Occupational History  . Not on file  Social Needs  . Financial resource strain: Not hard at all  . Food insecurity:    Worry: Never true    Inability: Never true  . Transportation needs:    Medical: No    Non-medical: No  Tobacco Use  . Smoking status: Former Smoker    Packs/day: 2.00    Years: 10.00    Pack years: 20.00    Last attempt to quit: 10/17/1992    Years since quitting: 26.2  . Smokeless  tobacco: Never Used  Substance and Sexual Activity  . Alcohol use: No  . Drug use: No  . Sexual activity: Not Currently  Lifestyle  . Physical activity:    Days per week: 0 days    Minutes per session: 0 min  . Stress: Very much  Relationships  . Social connections:    Talks on phone: Not on file    Gets together: Not on file    Attends religious service: More than 4 times per year    Active member of club or organization: No    Attends meetings of clubs or organizations: Never    Relationship status: Married  Other Topics Concern  . Not on file  Social History Narrative  . Not on file    Allergies:  Allergies  Allergen Reactions  . Ibuprofen Anaphylaxis  . Penicillins Anaphylaxis    Has patient had a PCN reaction causing immediate rash, facial/tongue/throat swelling, SOB or lightheadedness with hypotension: Yes Has patient had a PCN reaction causing severe rash involving mucus membranes or skin necrosis: Yes Has patient had a PCN reaction that required hospitalization Yes Has patient had a PCN reaction occurring within the last 10 years: Yes If all of the above answers are "NO", then may proceed with Cephalosporin use.   . Prednisone Palpitations  . Citalopram     dizziness   . Aspirin Palpitations    Metabolic Disorder Labs: No results found for: HGBA1C, MPG No results found for: PROLACTIN Lab Results  Component Value Date   CHOL 178 11/18/2015   TRIG 167 (H) 11/18/2015   HDL 34 (L) 11/18/2015   CHOLHDL 5.2 11/18/2015   VLDL 33 11/18/2015   LDLCALC 111 (H) 11/18/2015   No results found for: TSH  Therapeutic Level Labs: No results found for: LITHIUM No results found for: VALPROATE No components found for:  CBMZ  Current Medications: Current Outpatient Medications  Medication Sig Dispense Refill  . rOPINIRole (REQUIP) 2 MG tablet Take by mouth.    Marland Kitchen acetaminophen (TYLENOL) 500 MG tablet Take 1-2 tablets by mouth every 6 (six) hours as needed.    .  carbidopa-levodopa (SINEMET IR) 25-250 MG tablet Take 1 tablet by mouth 3 (three) times daily.    . clonazePAM (KLONOPIN) 0.5 MG tablet Take 1 tablet by mouth 3 (three) times daily.  0  . docusate sodium (COLACE) 100 MG capsule Take 100 mg by mouth 2 (two) times daily.    Marland Kitchen gabapentin (NEURONTIN) 600 MG tablet Take 600 mg by mouth at bedtime.    . mirtazapine (REMERON) 15 MG tablet Take 1.5 tablets (22.5 mg total) by mouth at bedtime. Anxiety and sleep 45 tablet 1  . propranolol (INDERAL) 10 MG tablet Take 1 tablet (10 mg total) by mouth 3 (three) times daily as needed. For severe panic attacks 90 tablet  1  . vitamin B-12 (CYANOCOBALAMIN) 1000 MCG tablet Take by mouth.     No current facility-administered medications for this visit.      Musculoskeletal: Strength & Muscle Tone: UTA Gait & Station: UTA Patient leans: N/A  Psychiatric Specialty Exam: Review of Systems  Psychiatric/Behavioral: The patient is nervous/anxious and has insomnia.   All other systems reviewed and are negative.   There were no vitals taken for this visit.There is no height or weight on file to calculate BMI.  General Appearance: Casual  Eye Contact:  Fair  Speech:  Normal Rate  Volume:  Normal  Mood:  Anxious  Affect:  Congruent  Thought Process:  Goal Directed and Descriptions of Associations: Intact  Orientation:  Full (Time, Place, and Person)  Thought Content: Logical   Suicidal Thoughts:  No  Homicidal Thoughts:  No  Memory:  Immediate;   Fair Recent;   Fair Remote;   Fair  Judgement:  Fair  Insight:  Fair  Psychomotor Activity:  Normal  Concentration:  Concentration: Fair and Attention Span: Fair  Recall:  [employer] of Knowledge: Fair  Language: Fair  Akathisia:  No  Handed:  Right  AIMS (if indicated): NA  Assets:  Communication Skills Desire for Improvement Housing  ADL's:  Intact  Cognition: WNL  Sleep:  Restless   Screenings:   Assessment and Plan: Gabriel Dillon is a 64 year old  Caucasian male, married, on disability, lives in [place], has a history of anxiety, restless leg syndrome, Parkinson's disease, BPH, chronic pain was evaluated by telemedicine today.  Patient is biologically predisposed given his history of chronic medical problems.  He also has psychosocial stressors of physical limitations due to his Parkinson's disease as well as the current COVID-19 outbreak.  Patient will continue to benefit from medication readjustment as well as psychotherapy sessions, since he struggles with anxiety.  Plan For panic disorder-currently stable Continue to wean off Klonopin. Provided him instructions to taper of Klonopin. Week 1 - Take only half tablet of Klonopin at night. Week 2 - Take only half tablet of klonopin in the afternoon on Wednesday and continue half tablets only at night. Week 3 - Take only half tablet Klonopin in the afternoon on Wednesday and Friday and continue half tablets night. Week 4 -Take only half tablet of klonopin in the afternoon on Monday, Wednesday , Friday and half tablets every night. Start propranolol 10 mg p.o. 3 times daily as needed-only for severe anxiety attacks.  Advised patient to limit use. He will continue psychotherapy sessions with Ms. Cecilie Lowers.  For GAD- unstable Increase mirtazapine to 22.5 mg at bedtime Continue CBT.  For insomnia- restless Patient has BPH with frequent urination which affects his sleep. Mirtazapine will help. Advised to start Requip at 2 mg which was recently increased by his other provider for restless leg syndrome.  Follow-up in clinic in 3 to 4 weeks or sooner if needed.  Follow up appointment scheduled for June 3 at 4:15.  I have spent atleast 25 minutes non face to face with patient today. More than 50 % of the time was spent for psychoeducation and supportive psychotherapy and care coordination.  This note was generated in part or whole with voice recognition software. Voice recognition is usually  quite accurate but there are transcription errors that can and very often do occur. I apologize for any typographical errors that were not detected and corrected.      Ursula Alert, MD 12/28/2018, 3:31 PM

## 2019-01-20 ENCOUNTER — Ambulatory Visit (INDEPENDENT_AMBULATORY_CARE_PROVIDER_SITE_OTHER): Payer: Medicare Other | Admitting: Psychiatry

## 2019-01-20 ENCOUNTER — Encounter: Payer: Self-pay | Admitting: Psychiatry

## 2019-01-20 ENCOUNTER — Other Ambulatory Visit: Payer: Self-pay

## 2019-01-20 DIAGNOSIS — G4700 Insomnia, unspecified: Secondary | ICD-10-CM | POA: Diagnosis not present

## 2019-01-20 DIAGNOSIS — F411 Generalized anxiety disorder: Secondary | ICD-10-CM

## 2019-01-20 DIAGNOSIS — F410 Panic disorder [episodic paroxysmal anxiety] without agoraphobia: Secondary | ICD-10-CM | POA: Diagnosis not present

## 2019-01-20 NOTE — Progress Notes (Signed)
Virtual Visit via Video Note  I connected with Gabriel Dillon on 01/20/19 at  4:15 PM EDT by a video enabled telemedicine application and verified that I am speaking with the correct person using two identifiers.   I discussed the limitations of evaluation and management by telemedicine and the availability of in person appointments. The patient expressed understanding and agreed to proceed.    I discussed the assessment and treatment plan with the patient. The patient was provided an opportunity to ask questions and all were answered. The patient agreed with the plan and demonstrated an understanding of the instructions.   The patient was advised to call back or seek an in-person evaluation if the symptoms worsen or if the condition fails to improve as anticipated.   [place] MD OP Progress Note  01/20/2019 5:14 PM Gabriel Dillon  MRN:  [id number]  Chief Complaint:  Chief Complaint    Follow-up     HPI: Gabriel Dillon is a 63 year old Caucasian male, married, lives [place], has a history of panic disorder, generalized anxiety disorder, Parkinson's disease, restless leg syndrome, chronic pain, BPH was evaluated by telemedicine today.  Patient today reports he has been struggling with some health problems recently.  He reports he had gout flareup and had to go to the emergency department.  He was given colchicine as well as prednisone.  He developed palpitations on prednisone and that was discontinued.  He reports his gout continues to improve.  He reports he hence has not been able to taper of Klonopin as recommended last visit.  He is currently taking a half at bedtime and has added one day of the week when he is taking a half dosage.  He continues to take 1 whole tablet in the morning.  He reports he had a recent panic attack few weeks ago and had to call 911.  He however reports he was able to calm down and felt better and did not have to go to the ED.  He reports he is feeling better on the  mirtazapine higher dosage.  He continues to take Requip which was recently increased by his other provider and that also seems to help.  He reports sleep is improved on the combination of mirtazapine and Requip.  Patient denies any suicidality homicidality or perceptual disturbances.  Patient denies any other concerns today. Visit Diagnosis:    ICD-10-CM   1. Panic disorder F41.0   2. GAD (generalized anxiety disorder) F41.1   3. Insomnia, unspecified type G47.00     Past Psychiatric History: I have reviewed past psychiatric history from my progress note on 12/09/2018.  Past trials of Klonopin, Valium, Zoloft, Celexa, Lexapro, BuSpar.  Past Medical History:  Past Medical History:  Diagnosis Date  . Allergic rhinitis   . Back pain   . Enlarged prostate   . Fatty liver   . Gout   . Nephrolithiasis   . Neuromuscular disorder ([place])   . Palpitation   . Panic attacks   . Panic attacks   . Parkinson's disease [facility])     Past Surgical History:  Procedure Laterality Date  . APPENDECTOMY    . COLONOSCOPY WITH PROPOFOL N/A 10/13/2018   Procedure: COLONOSCOPY WITH PROPOFOL;  Surgeon: Lollie Sails, MD;  Location: [facility] ENDOSCOPY;  Service: Endoscopy;  Laterality: N/A;  . TONSILLECTOMY      Family Psychiatric History: Reviewed family psychiatric history from my progress note on 12/09/2018  Family History:  Family History  Problem Relation Age of Onset  .  Mental illness Neg Hx     Social History: Reviewed social history from my progress note on 12/09/2018. Social History   Socioeconomic History  . Marital status: Married    Spouse name: teresa  . Number of children: 2  . Years of education: Not on file  . Highest education level: High school graduate  Occupational History  . Not on file  Social Needs  . Financial resource strain: Not hard at all  . Food insecurity:    Worry: Never true    Inability: Never true  . Transportation needs:    Medical: No    Non-medical:  No  Tobacco Use  . Smoking status: Former Smoker    Packs/day: 2.00    Years: 10.00    Pack years: 20.00    Last attempt to quit: 10/17/1992    Years since quitting: 26.2  . Smokeless tobacco: Never Used  Substance and Sexual Activity  . Alcohol use: No  . Drug use: No  . Sexual activity: Not Currently  Lifestyle  . Physical activity:    Days per week: 0 days    Minutes per session: 0 min  . Stress: Very much  Relationships  . Social connections:    Talks on phone: Not on file    Gets together: Not on file    Attends religious service: More than 4 times per year    Active member of club or organization: No    Attends meetings of clubs or organizations: Never    Relationship status: Married  Other Topics Concern  . Not on file  Social History Narrative  . Not on file    Allergies:  Allergies  Allergen Reactions  . Ibuprofen Anaphylaxis  . Penicillins Anaphylaxis    Has patient had a PCN reaction causing immediate rash, facial/tongue/throat swelling, SOB or lightheadedness with hypotension: Yes Has patient had a PCN reaction causing severe rash involving mucus membranes or skin necrosis: Yes Has patient had a PCN reaction that required hospitalization Yes Has patient had a PCN reaction occurring within the last 10 years: Yes If all of the above answers are "NO", then may proceed with Cephalosporin use.   . Prednisone Palpitations  . Citalopram     dizziness   . Aspirin Palpitations    Metabolic Disorder Labs: No results found for: HGBA1C, MPG No results found for: PROLACTIN Lab Results  Component Value Date   CHOL 178 11/18/2015   TRIG 167 (H) 11/18/2015   HDL 34 (L) 11/18/2015   CHOLHDL 5.2 11/18/2015   VLDL 33 11/18/2015   LDLCALC 111 (H) 11/18/2015   No results found for: TSH  Therapeutic Level Labs: No results found for: LITHIUM No results found for: VALPROATE No components found for:  CBMZ  Current Medications: Current Outpatient Medications   Medication Sig Dispense Refill  . acetaminophen (TYLENOL) 500 MG tablet Take 1-2 tablets by mouth every 6 (six) hours as needed.    . carbidopa-levodopa (SINEMET IR) 25-250 MG tablet Take 1 tablet by mouth 3 (three) times daily.    . clonazePAM (KLONOPIN) 0.5 MG tablet Take 1 tablet by mouth 3 (three) times daily.  0  . colchicine 0.6 MG tablet Take by mouth.    . docusate sodium (COLACE) 100 MG capsule Take 100 mg by mouth 2 (two) times daily.    Marland Kitchen gabapentin (NEURONTIN) 600 MG tablet Take 600 mg by mouth at bedtime.    . mirtazapine (REMERON) 15 MG tablet Take 1.5 tablets (22.5 mg  total) by mouth at bedtime. Anxiety and sleep 45 tablet 1  . propranolol (INDERAL) 10 MG tablet Take 1 tablet (10 mg total) by mouth 3 (three) times daily as needed. For severe panic attacks 90 tablet 1  . rOPINIRole (REQUIP) 2 MG tablet Take by mouth.    . vitamin B-12 (CYANOCOBALAMIN) 1000 MCG tablet Take by mouth.     No current facility-administered medications for this visit.      Musculoskeletal: Strength & Muscle Tone: within normal limits Gait & Station: walks with cane Patient leans: N/A  Psychiatric Specialty Exam: Review of Systems  Musculoskeletal:       Swelling of foot - right   Psychiatric/Behavioral: The patient is nervous/anxious.   All other systems reviewed and are negative.   There were no vitals taken for this visit.There is no height or weight on file to calculate BMI.  General Appearance: Casual  Eye Contact:  Good  Speech:  Clear and Coherent  Volume:  Normal  Mood:  Anxious  Affect:  Congruent  Thought Process:  Goal Directed and Descriptions of Associations: Intact  Orientation:  Full (Time, Place, and Person)  Thought Content: Logical   Suicidal Thoughts:  No  Homicidal Thoughts:  No  Memory:  Immediate;   Fair Recent;   Fair Remote;   Fair  Judgement:  Fair  Insight:  Fair  Psychomotor Activity:  Normal  Concentration:  Concentration: Fair and Attention Span:  Fair  Recall:  [employer] of Knowledge: Fair  Language: Fair  Akathisia:  No  Handed:  Left  AIMS (if indicated):denies tremors, rigidity  Assets:  Communication Skills Desire for Improvement Housing Social Support  ADL's:  Intact  Cognition: WNL  Sleep:  Fair   Screenings:   Assessment and Plan: Gabriel Dillon is a 63 year old Caucasian male, married, on disability, lives in [place], is a history of anxiety, restless leg syndrome, Parkinson's disease, BPH, chronic pain was evaluated by telemedicine today.  Patient is biologically predisposed given his history of chronic medical problems.  Patient also has psychosocial stressors of physical limitations due to Parkinson's disease, current COVID-19 outbreak.  Patient will continue to benefit from medication readjustment as well as psychotherapy sessions and reports mood symptoms is improving.  Plan For panic disorder- currently stable Continue to wean off Klonopin. Was provided instructions to do so last visit. Propranolol 10 mg p.o. 3 times daily PRN for severe anxiety attacks Continue psychotherapy sessions with Ms. Cecilie Lowers.  For GAD- improving Mirtazapine 22.5 mg at bedtime Continue CBT  For insomnia-improving Mirtazapine will help Requip 2 mg p.o. nightly- for restless leg syndrome-prescribed by his other provider Patient has BPH with frequent urination which affects his sleep.   Follow-up in clinic in 4 weeks or sooner if needed.  July 8 at 2:45 PM  I have spent atleast 15 minutes non face to face with patient today. More than 50 % of the time was spent for psychoeducation and supportive psychotherapy and care coordination.  This note was generated in part or whole with voice recognition software. Voice recognition is usually quite accurate but there are transcription errors that can and very often do occur. I apologize for any typographical errors that were not detected and corrected.        Ursula Alert,  MD 01/20/2019, 5:14 PM

## 2019-02-22 ENCOUNTER — Encounter: Payer: Self-pay | Admitting: Licensed Clinical Social Worker

## 2019-02-22 ENCOUNTER — Ambulatory Visit (INDEPENDENT_AMBULATORY_CARE_PROVIDER_SITE_OTHER): Payer: Medicare Other | Admitting: Licensed Clinical Social Worker

## 2019-02-22 ENCOUNTER — Other Ambulatory Visit: Payer: Self-pay

## 2019-02-22 DIAGNOSIS — F411 Generalized anxiety disorder: Secondary | ICD-10-CM

## 2019-02-22 DIAGNOSIS — F410 Panic disorder [episodic paroxysmal anxiety] without agoraphobia: Secondary | ICD-10-CM | POA: Diagnosis not present

## 2019-02-22 NOTE — Progress Notes (Signed)
Virtual Visit via Telephone Note  I connected with [name] on 02/22/19 at  9:00 AM EDT by telephone and verified that I am speaking with the correct person using two identifiers.   I discussed the limitations, risks, security and privacy concerns of performing an evaluation and management service by telephone and the availability of in person appointments. I also discussed with the patient that there may be a patient responsible charge related to this service. The patient expressed understanding and agreed to proceed.  I discussed the assessment and treatment plan with the patient. The patient was provided an opportunity to ask questions and all were answered. The patient agreed with the plan and demonstrated an understanding of the instructions.   The patient was advised to call back or seek an in-person evaluation if the symptoms worsen or if the condition fails to improve as anticipated.  I provided 25 minutes of non-face-to-face time during this encounter.   Alden Hipp, LCSW    THERAPIST PROGRESS NOTE  Session Time: 0900  Participation Level: Minimal  Behavioral Response: CasualAlertAnxious  Type of Therapy: Individual Therapy  Treatment Goals addressed: Anxiety  Interventions: CBT  Summary: Kayden Hutmacher is a 63 y.o. male who presents with continued symptoms related to his diagnosis. Reynald reports doing well since our last session. He reports, "I haven't been doing much, same old." We discussed the limitations on going out into the world due to COVID-19. He reported feeling trapped inside a lot, but added, "I go outside and it's so hot, so I go back in." We discussed activities to do inside that could help stimulate his interests. Kristen reported ongoing anxiety and panic attacks. We discussed ways to manage his anxiety in the moment. He reported that, most often, he tries to do breathing exercises and puts a wet wash cloth on his forehead during panic attacks. This helps  him calm down in the moment. We discussed other techniques he could utilize to remain safe/calm during panic. LCSW explained several different distraction techniques to assist in remaining calm when he feels panic set in. Additionally, LCSW encouraged Chanel to practice recognizing the initial symptom of anxiety, and attempting to utilize his coping skills in that moment before it turns into a panic attack. Hafiz expressed understanding and agreement.   Suicidal/Homicidal: No  Therapist Response: Braden continues to work towards his tx goals but has not yet reached them. We will continue to work on managing anxiety in the moment and focusing on coping skills to assist in staying safe/calm in the moment.   Plan: Return again in 8 weeks.  Diagnosis: Axis I: Generalized Anxiety Disorder    Axis II: No diagnosis    Alden Hipp, LCSW 02/22/2019

## 2019-02-24 ENCOUNTER — Other Ambulatory Visit: Payer: Self-pay

## 2019-02-24 ENCOUNTER — Ambulatory Visit (INDEPENDENT_AMBULATORY_CARE_PROVIDER_SITE_OTHER): Payer: Medicare Other | Admitting: Psychiatry

## 2019-02-24 ENCOUNTER — Encounter: Payer: Self-pay | Admitting: Psychiatry

## 2019-02-24 DIAGNOSIS — G4700 Insomnia, unspecified: Secondary | ICD-10-CM | POA: Insufficient documentation

## 2019-02-24 DIAGNOSIS — G4701 Insomnia due to medical condition: Secondary | ICD-10-CM

## 2019-02-24 DIAGNOSIS — F411 Generalized anxiety disorder: Secondary | ICD-10-CM

## 2019-02-24 DIAGNOSIS — F410 Panic disorder [episodic paroxysmal anxiety] without agoraphobia: Secondary | ICD-10-CM | POA: Diagnosis not present

## 2019-02-24 MED ORDER — HYDROXYZINE PAMOATE 25 MG PO CAPS: 25.0000 mg | ORAL_CAPSULE | Freq: Every evening | ORAL | 0 refills | Status: DC | PRN

## 2019-02-24 MED ORDER — MIRTAZAPINE 15 MG PO TABS: 22.5000 mg | ORAL_TABLET | Freq: Every day | ORAL | 1 refills | Status: DC

## 2019-02-24 NOTE — Progress Notes (Signed)
Virtual Visit via Telephone Note  I connected with Gabriel Dillon on 02/24/19 at  2:45 PM EDT by telephone and verified that I am speaking with the correct person using two identifiers.   I discussed the limitations, risks, security and privacy concerns of performing an evaluation and management service by telephone and the availability of in person appointments. I also discussed with the patient that there may be a patient responsible charge related to this service. The patient expressed understanding and agreed to proceed.    I discussed the assessment and treatment plan with the patient. The patient was provided an opportunity to ask questions and all were answered. The patient agreed with the plan and demonstrated an understanding of the instructions.   The patient was advised to call back or seek an in-person evaluation if the symptoms worsen or if the condition fails to improve as anticipated.   Archer MD OP Progress Note  02/24/2019 5:57 PM Gabriel Dillon  MRN:  [id number]  Chief Complaint:  Chief Complaint    Follow-up     HPI: [name] is a 63 year old Caucasian male, married, lives in [place], history of panic attacks, generalized anxiety disorder, Parkinson's disease, restless leg syndrome, chronic pain, BPH, insomnia was evaluated by phone today.  Patient was offered video call however declined.  Patient today reports his anxiety symptoms improving on the current medication regimen.  He denies any side effects to the medications.  He reports he is compliant on the medications he reports he has been able to limit the use of Klonopin and does not use it much.  Patient reports he continues to struggle with sleep issues more so because of his BPH to urinate at night.  He however reports the medication with food to some extent with his sleep.  Discussed adding a small dose of hydroxyzine as needed for sleep at night and he agrees with plan.  Patient denies any other concerns  today. Visit Diagnosis:    ICD-10-CM   1. Panic disorder  F41.0 mirtazapine (REMERON) 15 MG tablet  2. GAD (generalized anxiety disorder)  F41.1   3. Insomnia due to medical condition  G47.01 hydrOXYzine (VISTARIL) 25 MG capsule    Past Psychiatric History: Past trials of Klonopin, Valium, Zoloft, Celexa, Lexapro, BuSpar.  Past Medical History:  Past Medical History:  Diagnosis Date  . Allergic rhinitis   . Back pain   . Enlarged prostate   . Fatty liver   . Gout   . Nephrolithiasis   . Neuromuscular disorder ([place])   . Palpitation   . Panic attacks   . Panic attacks   . Parkinson's disease [facility])     Past Surgical History:  Procedure Laterality Date  . APPENDECTOMY    . COLONOSCOPY WITH PROPOFOL N/A 10/13/2018   Procedure: COLONOSCOPY WITH PROPOFOL;  Surgeon: Lollie Sails, MD;  Location: [facility] ENDOSCOPY;  Service: Endoscopy;  Laterality: N/A;  . TONSILLECTOMY      Family Psychiatric History: I have reviewed family psychiatric history from my progress note on 12/09/2018.  Family History:  Family History  Problem Relation Age of Onset  . Mental illness Neg Hx     Social History: I have reviewed social history from my progress note on 12/09/2018. Social History   Socioeconomic History  . Marital status: Married    Spouse name: Gabriel Dillon  . Number of children: 2  . Years of education: Not on file  . Highest education level: High school graduate  Occupational History  .  Not on file  Social Needs  . Financial resource strain: Not hard at all  . Food insecurity    Worry: Never true    Inability: Never true  . Transportation needs    Medical: No    Non-medical: No  Tobacco Use  . Smoking status: Former Smoker    Packs/day: 2.00    Years: 10.00    Pack years: 20.00    Quit date: 10/17/1992    Years since quitting: 26.3  . Smokeless tobacco: Never Used  Substance and Sexual Activity  . Alcohol use: No  . Drug use: No  . Sexual activity: Not Currently   Lifestyle  . Physical activity    Days per week: 0 days    Minutes per session: 0 min  . Stress: Very much  Relationships  . Social Herbalist on phone: Not on file    Gets together: Not on file    Attends religious service: More than 4 times per year    Active member of club or organization: No    Attends meetings of clubs or organizations: Never    Relationship status: Married  Other Topics Concern  . Not on file  Social History Narrative  . Not on file    Allergies:  Allergies  Allergen Reactions  . Ibuprofen Anaphylaxis  . Penicillins Anaphylaxis    Has patient had a PCN reaction causing immediate rash, facial/tongue/throat swelling, SOB or lightheadedness with hypotension: Yes Has patient had a PCN reaction causing severe rash involving mucus membranes or skin necrosis: Yes Has patient had a PCN reaction that required hospitalization Yes Has patient had a PCN reaction occurring within the last 10 years: Yes If all of the above answers are "NO", then may proceed with Cephalosporin use.   . Prednisone Palpitations  . Citalopram     dizziness   . Aspirin Palpitations    Metabolic Disorder Labs: No results found for: HGBA1C, MPG No results found for: PROLACTIN Lab Results  Component Value Date   CHOL 178 11/18/2015   TRIG 167 (H) 11/18/2015   HDL 34 (L) 11/18/2015   CHOLHDL 5.2 11/18/2015   VLDL 33 11/18/2015   LDLCALC 111 (H) 11/18/2015   No results found for: TSH  Therapeutic Level Labs: No results found for: LITHIUM No results found for: VALPROATE No components found for:  CBMZ  Current Medications: Current Outpatient Medications  Medication Sig Dispense Refill  . acetaminophen (TYLENOL) 500 MG tablet Take 1-2 tablets by mouth every 6 (six) hours as needed.    . carbidopa-levodopa (SINEMET IR) 25-250 MG tablet Take 1 tablet by mouth 3 (three) times daily.    . clonazePAM (KLONOPIN) 0.5 MG tablet Take 1 tablet by mouth 3 (three) times daily.   0  . colchicine 0.6 MG tablet Take by mouth.    . docusate sodium (COLACE) 100 MG capsule Take 100 mg by mouth 2 (two) times daily.    Marland Kitchen gabapentin (NEURONTIN) 600 MG tablet Take 600 mg by mouth at bedtime.    . hydrOXYzine (VISTARIL) 25 MG capsule Take 1 capsule (25 mg total) by mouth at bedtime as needed. For sleep 90 capsule 0  . mirtazapine (REMERON) 15 MG tablet Take 1.5 tablets (22.5 mg total) by mouth at bedtime. Anxiety and sleep 45 tablet 1  . propranolol (INDERAL) 10 MG tablet Take 1 tablet (10 mg total) by mouth 3 (three) times daily as needed. For severe panic attacks 90 tablet 1  .  rOPINIRole (REQUIP) 2 MG tablet Take by mouth.    . vitamin B-12 (CYANOCOBALAMIN) 1000 MCG tablet Take by mouth.     No current facility-administered medications for this visit.      Musculoskeletal: Strength & Muscle Tone: UTA Gait & Station: Reports as WNL Patient leans: N/A  Psychiatric Specialty Exam: Review of Systems  Psychiatric/Behavioral: The patient is nervous/anxious and has insomnia.   All other systems reviewed and are negative.   There were no vitals taken for this visit.There is no height or weight on file to calculate BMI.  General Appearance: UTA  Eye Contact:  UTA  Speech:  Normal Rate  Volume:  Normal  Mood:  Anxious  Affect:  UTA  Thought Process:  Goal Directed and Descriptions of Associations: Intact  Orientation:  Full (Time, Place, and Person)  Thought Content: Logical   Suicidal Thoughts:  No  Homicidal Thoughts:  No  Memory:  Immediate;   Fair Recent;   Fair Remote;   Fair  Judgement:  Fair  Insight:  Fair  Psychomotor Activity:  UTA  Concentration:  Concentration: Fair and Attention Span: Fair  Recall:  [employer] of Knowledge: Fair  Language: Fair  Akathisia:  No  Handed:  Right  AIMS (if indicated): Denies tremors, rigidity  Assets:  Communication Skills Desire for Improvement Housing Social Support  ADL's:  Intact  Cognition: WNL  Sleep:   Poor   Screenings:   Assessment and Plan: Evander is a 63 year old Caucasian male, married, on disability, lives in [place], has a history of anxiety, restless leg syndrome, Parkinson's disease, BPH, chronic pain was evaluated by telemedicine today.  Patient is biologically predisposed given his history of chronic medical problems.  He also has psychosocial stressors of physical limitations due to Parkinson's disease, current COVID-19 outbreak..  Plan as noted below.  Plan Panic attacks-improving Continue to limit Klonopin. Propranolol 10 mg p.o. 3 times daily PRN for severe anxiety attacks Mirtazapine 22.5 mg p.o. nightly.  For GAD-improving Mirtazapine as prescribed Continue CBT  For insomnia- restless Mirtazapine will help Requip 2 mg p.o. nightly. Add hydroxyzine 25 mg p.o. nightly as needed for anxiety and sleep.  Follow-up in clinic in 1 month or sooner if needed.  August 28 at 10:45 AM  I have spent atleast 15 minutes non face to face with patient today. More than 50 % of the time was spent for psychoeducation and supportive psychotherapy and care coordination.  This note was generated in part or whole with voice recognition software. Voice recognition is usually quite accurate but there are transcription errors that can and very often do occur. I apologize for any typographical errors that were not detected and corrected.         Ursula Alert, MD 02/24/2019, 5:57 PM

## 2019-03-16 DIAGNOSIS — G479 Sleep disorder, unspecified: Secondary | ICD-10-CM | POA: Insufficient documentation

## 2019-03-18 ENCOUNTER — Telehealth: Payer: Self-pay | Admitting: Psychiatry

## 2019-03-18 ENCOUNTER — Telehealth: Payer: Self-pay

## 2019-03-18 NOTE — Telephone Encounter (Signed)
Patient's Klonopin is being prescribed by his neurologist.  He recently received 90 quantity for 30 days.  Will not give him Klonopin.

## 2019-03-18 NOTE — Telephone Encounter (Signed)
pt called left a message that he needed refill on klonopin.

## 2019-03-18 NOTE — Telephone Encounter (Signed)
Please let him know to reach out to his provider who is prescribing him Klonopin.  Patient was advised to taper off Klonopin and he had agreed with it.  It cannot be prescribed by multiple providers.  He recently got 90 tablets.  Please let him know.  Thanks

## 2019-04-05 ENCOUNTER — Other Ambulatory Visit: Payer: Self-pay | Admitting: Neurology

## 2019-04-05 DIAGNOSIS — H532 Diplopia: Secondary | ICD-10-CM

## 2019-04-10 ENCOUNTER — Other Ambulatory Visit: Payer: Self-pay

## 2019-04-10 ENCOUNTER — Ambulatory Visit
Admission: RE | Admit: 2019-04-10 | Discharge: 2019-04-10 | Disposition: A | Discharge status: Home / Self Care | Payer: Medicare Other | Source: Ambulatory Visit | Attending: Neurology | Admitting: Neurology

## 2019-04-10 DIAGNOSIS — H532 Diplopia: Secondary | ICD-10-CM

## 2019-04-16 ENCOUNTER — Ambulatory Visit (INDEPENDENT_AMBULATORY_CARE_PROVIDER_SITE_OTHER): Payer: Medicare Other | Admitting: Psychiatry

## 2019-04-16 ENCOUNTER — Encounter: Payer: Self-pay | Admitting: Psychiatry

## 2019-04-16 ENCOUNTER — Other Ambulatory Visit: Payer: Self-pay

## 2019-04-16 DIAGNOSIS — F410 Panic disorder [episodic paroxysmal anxiety] without agoraphobia: Secondary | ICD-10-CM

## 2019-04-16 DIAGNOSIS — G4701 Insomnia due to medical condition: Secondary | ICD-10-CM | POA: Diagnosis not present

## 2019-04-16 DIAGNOSIS — F411 Generalized anxiety disorder: Secondary | ICD-10-CM | POA: Diagnosis not present

## 2019-04-16 MED ORDER — HYDROXYZINE PAMOATE 25 MG PO CAPS: 25.0000 mg | ORAL_CAPSULE | Freq: Every evening | ORAL | 3 refills | Status: DC | PRN

## 2019-04-16 MED ORDER — PROPRANOLOL HCL 10 MG PO TABS: 10.0000 mg | ORAL_TABLET | Freq: Three times a day (TID) | ORAL | 3 refills | Status: DC | PRN

## 2019-04-16 MED ORDER — MIRTAZAPINE 15 MG PO TABS: 22.5000 mg | ORAL_TABLET | Freq: Every day | ORAL | 3 refills | Status: DC

## 2019-04-16 NOTE — Progress Notes (Signed)
Virtual Visit via Telephone Note  I connected with Gabriel Dillon on 04/16/19 at 10:45 AM EDT by telephone and verified that I am speaking with the correct person using two identifiers.   I discussed the limitations, risks, security and privacy concerns of performing an evaluation and management service by telephone and the availability of in person appointments. I also discussed with the patient that there may be a patient responsible charge related to this service. The patient expressed understanding and agreed to proceed.    I discussed the assessment and treatment plan with the patient. The patient was provided an opportunity to ask questions and all were answered. The patient agreed with the plan and demonstrated an understanding of the instructions.   The patient was advised to call back or seek an in-person evaluation if the symptoms worsen or if the condition fails to improve as anticipated.   Hartman MD OP Progress Note  04/16/2019 12:47 PM Gabriel Dillon  MRN:  [id number]  Chief Complaint:  Chief Complaint    Follow-up     HPI: Gabriel Dillon is a 63 year old Caucasian male, married, lives in [place], has a history of panic attacks, GAD, insomnia, restless leg syndrome, Parkinson's disease, chronic pain, BPH, insomnia was evaluated by phone today.  Patient preferred to do a phone call.  Patient's wife Clarene Critchley provided collateral information.  Patient today reports he continues to struggle with some sleep issues on and off.  His sleep is affected due to a lot of reasons including his BPH and frequent urination at night.  He is already on mirtazapine as well as hydroxyzine and Requip.  He takes the hydroxyzine only as needed.  Discussed with patient about adding melatonin over-the-counter.  Patient reports he continues to be on Klonopin, prescribed by neurology.  Discussed with him to gradually wean it off with support from his provider who is prescribing it to him.  Discussed with him he  could use his propranolol as needed for anxiety attacks.  Patient continues to be compliant on mirtazapine.  He appeared to be alert, oriented to person place and situation today.  He reports he had recent double vision and had an MRI done.  He reports the MRI came back within normal limits.  He has upcoming appointment with ophthalmology.  He denies any other concerns today. Visit Diagnosis:    ICD-10-CM   1. Panic disorder  F41.0 mirtazapine (REMERON) 15 MG tablet    propranolol (INDERAL) 10 MG tablet  2. GAD (generalized anxiety disorder)  F41.1 propranolol (INDERAL) 10 MG tablet  3. Insomnia due to medical condition  G47.01 hydrOXYzine (VISTARIL) 25 MG capsule    Past Psychiatric History: I have reviewed past psychiatric history from my progress note on 12/09/2018.  Past trials of Klonopin, Valium, Zoloft, Celexa, Lexapro, BuSpar.  Past Medical History:  Past Medical History:  Diagnosis Date  . Allergic rhinitis   . Back pain   . Enlarged prostate   . Fatty liver   . Gout   . Nephrolithiasis   . Neuromuscular disorder ([place])   . Palpitation   . Panic attacks   . Panic attacks   . Parkinson's disease [facility])     Past Surgical History:  Procedure Laterality Date  . APPENDECTOMY    . COLONOSCOPY WITH PROPOFOL N/A 10/13/2018   Procedure: COLONOSCOPY WITH PROPOFOL;  Surgeon: Lollie Sails, MD;  Location: [facility] ENDOSCOPY;  Service: Endoscopy;  Laterality: N/A;  . TONSILLECTOMY      Family Psychiatric History: I have  reviewed family psychiatric history from my progress note on 12/09/2018.  Family History:  Family History  Problem Relation Age of Onset  . Mental illness Neg Hx     Social History: I have reviewed social history from my progress note on 12/09/2018. Social History   Socioeconomic History  . Marital status: Married    Spouse name: teresa  . Number of children: 2  . Years of education: Not on file  . Highest education level: High school graduate   Occupational History  . Not on file  Social Needs  . Financial resource strain: Not hard at all  . Food insecurity    Worry: Never true    Inability: Never true  . Transportation needs    Medical: No    Non-medical: No  Tobacco Use  . Smoking status: Former Smoker    Packs/day: 2.00    Years: 10.00    Pack years: 20.00    Quit date: 10/17/1992    Years since quitting: 26.5  . Smokeless tobacco: Never Used  Substance and Sexual Activity  . Alcohol use: No  . Drug use: No  . Sexual activity: Not Currently  Lifestyle  . Physical activity    Days per week: 0 days    Minutes per session: 0 min  . Stress: Very much  Relationships  . Social Herbalist on phone: Not on file    Gets together: Not on file    Attends religious service: More than 4 times per year    Active member of club or organization: No    Attends meetings of clubs or organizations: Never    Relationship status: Married  Other Topics Concern  . Not on file  Social History Narrative  . Not on file    Allergies:  Allergies  Allergen Reactions  . Ibuprofen Anaphylaxis  . Penicillins Anaphylaxis    Has patient had a PCN reaction causing immediate rash, facial/tongue/throat swelling, SOB or lightheadedness with hypotension: Yes Has patient had a PCN reaction causing severe rash involving mucus membranes or skin necrosis: Yes Has patient had a PCN reaction that required hospitalization Yes Has patient had a PCN reaction occurring within the last 10 years: Yes If all of the above answers are "NO", then may proceed with Cephalosporin use.   . Prednisone Palpitations  . Citalopram     dizziness   . Aspirin Palpitations    Metabolic Disorder Labs: No results found for: HGBA1C, MPG No results found for: PROLACTIN Lab Results  Component Value Date   CHOL 178 11/18/2015   TRIG 167 (H) 11/18/2015   HDL 34 (L) 11/18/2015   CHOLHDL 5.2 11/18/2015   VLDL 33 11/18/2015   LDLCALC 111 (H) 11/18/2015    No results found for: TSH  Therapeutic Level Labs: No results found for: LITHIUM No results found for: VALPROATE No components found for:  CBMZ  Current Medications: Current Outpatient Medications  Medication Sig Dispense Refill  . colchicine 0.6 MG tablet Take by mouth.    . diazepam (VALIUM) 5 MG tablet Take 5 mg 30 minutes prior to MRI    . ketoconazole (NIZORAL) 2 % shampoo APPLY TOPICALLY ONCE AND LEAVE ON FOR 5 MINUTES THEN RINSE. THEN USE TWICE WEEKLY FOR 4 WEEKS, THEN AS NEEDED    . tiZANidine (ZANAFLEX) 2 MG tablet Take by mouth.    Marland Kitchen acetaminophen (TYLENOL) 500 MG tablet Take 1-2 tablets by mouth every 6 (six) hours as needed.    Marland Kitchen  carbidopa-levodopa (SINEMET IR) 25-250 MG tablet Take 1 tablet by mouth 3 (three) times daily.    . clonazePAM (KLONOPIN) 0.5 MG tablet Take 1 tablet by mouth 3 (three) times daily.  0  . colchicine 0.6 MG tablet Take by mouth.    . docusate sodium (COLACE) 100 MG capsule Take 100 mg by mouth 2 (two) times daily.    Marland Kitchen gabapentin (NEURONTIN) 600 MG tablet Take 600 mg by mouth at bedtime.    . hydrOXYzine (VISTARIL) 25 MG capsule Take 1 capsule (25 mg total) by mouth at bedtime as needed. For sleep 90 capsule 3  . mirtazapine (REMERON) 15 MG tablet Take 1.5 tablets (22.5 mg total) by mouth at bedtime. Anxiety and sleep 45 tablet 3  . propranolol (INDERAL) 10 MG tablet Take 1 tablet (10 mg total) by mouth 3 (three) times daily as needed. For severe panic attacks 90 tablet 3  . rOPINIRole (REQUIP) 2 MG tablet Take by mouth.    . vitamin B-12 (CYANOCOBALAMIN) 1000 MCG tablet Take by mouth.     No current facility-administered medications for this visit.      Musculoskeletal: Strength & Muscle Tone: UTA Gait & Station: Reports as wnl Patient leans: N/A  Psychiatric Specialty Exam: Review of Systems  Eyes: Positive for double vision.  Psychiatric/Behavioral: The patient is nervous/anxious and has insomnia.   All other systems reviewed and are  negative.   There were no vitals taken for this visit.There is no height or weight on file to calculate BMI.  General Appearance: UTA  Eye Contact:  UTA  Speech:  Clear and Coherent  Volume:  Normal  Mood:  Anxious  Affect:  UTA  Thought Process:  Goal Directed and Descriptions of Associations: Intact  Orientation:  Full (Time, Place, and Person)  Thought Content: Logical   Suicidal Thoughts:  No  Homicidal Thoughts:  No  Memory:  Immediate;   Fair Recent;   Fair Remote;   Fair  Judgement:  Fair  Insight:  Fair  Psychomotor Activity:  UTA  Concentration:  Concentration: Fair and Attention Span: Fair  Recall:  [employer] of Knowledge: Fair  Language: Fair  Akathisia:  No  Handed:  Right  AIMS (if indicated): UTA  Assets:  Communication Skills Desire for Improvement Housing Social Support  ADL's:  Intact  Cognition: WNL  Sleep:  Restless   Screenings:   Assessment and Plan: Courtez is a 63 year old Caucasian male, married, on disability, lives in [place], has a history of anxiety, restless leg syndrome, Parkinson's disease, BPH, chronic pain was evaluated by telemedicine today.  Patient is biologically predisposed given his history of chronic medical problems.  He also has current psychosocial stressors of physical limitations due to Parkinson's disease and pandemic which is going on.  Patient continues to struggle with sleep problems.  He is compliant on medications.  Plan as noted below.  Plan Panic attacks- improving Continue to limit Klonopin.  Klonopin is being prescribed by neurology.  Pamelor 10 mg p.o. 3 times daily PRN for severe anxiety attacks Remeron 22.5 mg p.o. nightly  GAD-improving Mirtazapine as prescribed Continue CBT  Insomnia-restless Mirtazapine will help. He is also on Requip 2 mg p.o. nightly Hydroxyzine 25 mg as needed for anxiety and sleep at night. Discussed getting melatonin over-the-counter- 2 mg p.o. nightly.  Follow-up in clinic  in 2 to 3 months or sooner if needed.  December 2 at 10 AM  I have spent atleast 15 minutes non face to  face with patient today. More than 50 % of the time was spent for psychoeducation and supportive psychotherapy and care coordination. This note was generated in part or whole with voice recognition software. Voice recognition is usually quite accurate but there are transcription errors that can and very often do occur. I apologize for any typographical errors that were not detected and corrected.       Ursula Alert, MD 04/16/2019, 12:47 PM

## 2019-04-20 ENCOUNTER — Other Ambulatory Visit: Payer: Self-pay

## 2019-04-20 ENCOUNTER — Encounter: Payer: Self-pay | Admitting: Licensed Clinical Social Worker

## 2019-04-20 ENCOUNTER — Ambulatory Visit (INDEPENDENT_AMBULATORY_CARE_PROVIDER_SITE_OTHER): Payer: Medicare Other | Admitting: Licensed Clinical Social Worker

## 2019-04-20 DIAGNOSIS — F410 Panic disorder [episodic paroxysmal anxiety] without agoraphobia: Secondary | ICD-10-CM

## 2019-04-20 DIAGNOSIS — F411 Generalized anxiety disorder: Secondary | ICD-10-CM

## 2019-04-20 NOTE — Progress Notes (Signed)
Virtual Visit via Telephone Note  I connected with Gabriel Dillon on 04/20/19 at  9:00 AM EDT by telephone and verified that I am speaking with the correct person using two identifiers.   I discussed the limitations, risks, security and privacy concerns of performing an evaluation and management service by telephone and the availability of in person appointments. I also discussed with the patient that there may be a patient responsible charge related to this service. The patient expressed understanding and agreed to proceed.    I discussed the assessment and treatment plan with the patient. The patient was provided an opportunity to ask questions and all were answered. The patient agreed with the plan and demonstrated an understanding of the instructions.   The patient was advised to call back or seek an in-person evaluation if the symptoms worsen or if the condition fails to improve as anticipated.  I provided 30 minutes of non-face-to-face time during this encounter.   Alden Hipp, LCSW    THERAPIST PROGRESS NOTE  Session Time: 0900  Participation Level: Minimal  Behavioral Response: CasualAlertAnxious  Type of Therapy: Individual Therapy  Treatment Goals addressed: Anxiety  Interventions: CBT  Summary: Gabriel Dillon is a 63 y.o. male who presents with continued symptoms related to his diagnosis. Gabriel Dillon reports doing well since our last session. He reports moments of anxiety and panic, but reported he has been able to manage it. LCSW asked if Gabriel Dillon had practiced any of the distraction skills we discussed in our previous session. Victorious admitted he did not practice any of them, and "forgot what we said." LCSW reminded Avelino of how he could utilize distraction (in most any form) to stay calm during moments of panic. Jeziah reported he recently got a hummingbird feeder which he enjoys watching, so LCSW suggested during moments of panic, Gabriel Dillon watch the bird feeder. Gabriel Dillon expressed  understanding and agreement. We reviewed other ways Gabriel Dillon could distract himself in the moment as well, and LCSW explained why distraction can be so useful during panic attacks. Naftula expressed understanding and agreement. Further, Gabriel Dillon reported boredom due to COVID. LCSW asked if Gabriel Dillon had followed through with the ideas we discussed in our last session on ways he could participate in activities in the house that interest him. He reported he did not practice that idea, but stated he would try this time around. LCSW expressed agreement and again reviewed how finding ways to incorporate Gabriel Dillon's interests into his quarantine routine would be helpful for his anxiety.   Suicidal/Homicidal: No  Therapist Response: Gabriel Dillon continues to work towards his tx goals but has not yet reached them. Kohle reported he did not practice any of the skills we discussed in our previous session, so we will continue to work to improve those moving forward.   Plan: Return again in 4 weeks.  Diagnosis: Axis I: Generalized Anxiety Disorder    Axis II: No diagnosis    Alden Hipp, LCSW 04/20/2019

## 2019-04-23 ENCOUNTER — Other Ambulatory Visit: Payer: Self-pay | Admitting: Ophthalmology

## 2019-04-23 DIAGNOSIS — H5010 Unspecified exotropia: Secondary | ICD-10-CM

## 2019-04-23 DIAGNOSIS — H532 Diplopia: Secondary | ICD-10-CM

## 2019-05-10 ENCOUNTER — Ambulatory Visit
Admission: RE | Admit: 2019-05-10 | Discharge: 2019-05-10 | Disposition: A | Discharge status: Home / Self Care | Payer: Medicare Other | Source: Ambulatory Visit | Attending: Ophthalmology | Admitting: Ophthalmology

## 2019-05-10 ENCOUNTER — Other Ambulatory Visit: Payer: Self-pay

## 2019-05-10 ENCOUNTER — Other Ambulatory Visit: Payer: Self-pay | Admitting: Ophthalmology

## 2019-05-10 DIAGNOSIS — H532 Diplopia: Secondary | ICD-10-CM | POA: Insufficient documentation

## 2019-05-10 DIAGNOSIS — H5010 Unspecified exotropia: Secondary | ICD-10-CM | POA: Insufficient documentation

## 2019-05-10 LAB — POCT I-STAT CREATININE: Creatinine, Ser: 1.2 mg/dL (ref 0.61–1.24)

## 2019-05-10 MED ORDER — IOHEXOL 300 MG/ML  SOLN
75.0000 mL | Freq: Once | INTRAMUSCULAR | Status: AC | PRN
  Administered 2019-05-10: 75 mL via INTRAVENOUS

## 2019-05-25 ENCOUNTER — Other Ambulatory Visit: Payer: Self-pay

## 2019-05-25 ENCOUNTER — Ambulatory Visit (INDEPENDENT_AMBULATORY_CARE_PROVIDER_SITE_OTHER): Payer: Medicare Other | Admitting: Licensed Clinical Social Worker

## 2019-05-25 ENCOUNTER — Encounter: Payer: Self-pay | Admitting: Licensed Clinical Social Worker

## 2019-05-25 DIAGNOSIS — F410 Panic disorder [episodic paroxysmal anxiety] without agoraphobia: Secondary | ICD-10-CM | POA: Diagnosis not present

## 2019-05-25 DIAGNOSIS — F411 Generalized anxiety disorder: Secondary | ICD-10-CM | POA: Diagnosis not present

## 2019-05-25 NOTE — Progress Notes (Signed)
Virtual Visit via Video Note  I connected with [name] on 05/25/19 at 10:00 AM EDT by a video enabled telemedicine application and verified that I am speaking with the correct person using two identifiers.   I discussed the limitations of evaluation and management by telemedicine and the availability of in person appointments. The patient expressed understanding and agreed to proceed.  I discussed the assessment and treatment plan with the patient. The patient was provided an opportunity to ask questions and all were answered. The patient agreed with the plan and demonstrated an understanding of the instructions.   The patient was advised to call back or seek an in-person evaluation if the symptoms worsen or if the condition fails to improve as anticipated.  I provided 30 minutes of non-face-to-face time during this encounter.   Alden Hipp, LCSW   THERAPIST PROGRESS NOTE  Session Time: 1000  Participation Level: Active  Behavioral Response: NeatAlertAnxious  Type of Therapy: Individual Therapy  Treatment Goals addressed: Anxiety  Interventions: CBT  Summary: Tallie Maritato is a 63 y.o. male who presents with continued symptoms related to his diagnosis. Nichola reports doing well since our last visit. He reports some medical concerns that he's been addressing with his medical doctor, and he stated that has caused him some increased anxiety. We discussed ways Antjuan could manage his anxiety in the moment, and revisited ways we've discussed in the past. Ethian reports he has been able to lean on his wife for support during this time, which we discussed as a major positive. Further, LCSW encouraged Jaman to utilize the distraction techniques we've discussed. LCSW suggested [employer] for his phone and utilizing those when he is feeling a hint of anxiety, in order to stay on top of utilizing distraction. Shylo expressed understanding and agreement with this information  and how he can utilize it.   Suicidal/Homicidal: No  Therapist Response: Coley continues to work towards his tx goals but has not yet reached them. We will continue to work on emotional regulation skills moving forward and improving distraction techniques to reduce panic in the moment.   Plan: Return again in 8 weeks.  Diagnosis: Axis I: Panic Disorder    Axis II: No diagnosis    Alden Hipp, LCSW 05/25/2019

## 2019-07-21 ENCOUNTER — Ambulatory Visit (INDEPENDENT_AMBULATORY_CARE_PROVIDER_SITE_OTHER): Payer: Medicare Other | Admitting: Psychiatry

## 2019-07-21 ENCOUNTER — Other Ambulatory Visit: Payer: Self-pay

## 2019-07-21 ENCOUNTER — Encounter: Payer: Self-pay | Admitting: Psychiatry

## 2019-07-21 DIAGNOSIS — F411 Generalized anxiety disorder: Secondary | ICD-10-CM

## 2019-07-21 DIAGNOSIS — F410 Panic disorder [episodic paroxysmal anxiety] without agoraphobia: Secondary | ICD-10-CM | POA: Diagnosis not present

## 2019-07-21 DIAGNOSIS — H5034 Intermittent alternating exotropia: Secondary | ICD-10-CM | POA: Insufficient documentation

## 2019-07-21 DIAGNOSIS — G4701 Insomnia due to medical condition: Secondary | ICD-10-CM | POA: Diagnosis not present

## 2019-07-21 MED ORDER — MIRTAZAPINE 15 MG PO TABS: 22.5000 mg | ORAL_TABLET | Freq: Every day | ORAL | 1 refills | Status: DC

## 2019-07-21 NOTE — Progress Notes (Signed)
Virtual Visit via Telephone Note  I connected with Gabriel Dillon on 07/21/19 at 10:00 AM EST by telephone and verified that I am speaking with the correct person using two identifiers.   I discussed the limitations, risks, security and privacy concerns of performing an evaluation and management service by telephone and the availability of in person appointments. I also discussed with the patient that there may be a patient responsible charge related to this service. The patient expressed understanding and agreed to proceed.     I discussed the assessment and treatment plan with the patient. The patient was provided an opportunity to ask questions and all were answered. The patient agreed with the plan and demonstrated an understanding of the instructions.   The patient was advised to call back or seek an in-person evaluation if the symptoms worsen or if the condition fails to improve as anticipated.    [place] MD OP Progress Note  07/21/2019 11:42 AM Gabriel Dillon  MRN:  [id number]  Chief Complaint:  Chief Complaint    Follow-up     HPI: Gabriel Dillon is a 63 year old Caucasian male, married, lives in [place], has a history of panic attacks, GAD, insomnia, restless leg syndrome, Parkinson's disease, chronic pain, BPH, insomnia was evaluated by phone today.  Patient preferred to do a phone call.  Patient today reports he is currently doing well with regards to his mood symptoms.  He denies any significant anxiety attacks.  He is compliant on mirtazapine as prescribed.  Patient continues to take Klonopin  prescribed by neurology.  He reports he continues to have chronic tremors however as long as he takes the medication it is under control.  Patient denies any side effects to medications at this time.  He appeared to be alert oriented to person place and situation.  Patient denies any other concerns today. Visit Diagnosis:    ICD-10-CM   1. Panic disorder  F41.0 mirtazapine (REMERON) 15 MG  tablet  2. GAD (generalized anxiety disorder)  F41.1   3. Insomnia due to medical condition  G47.01     Past Psychiatric History: I have reviewed past psychiatric history from my progress note on 61/22/2020.  Past trials of Klonopin, Valium, Zoloft, Celexa, Lexapro, BuSpar.  Past Medical History:  Past Medical History:  Diagnosis Date  . Allergic rhinitis   . Back pain   . Enlarged prostate   . Fatty liver   . Gout   . Nephrolithiasis   . Neuromuscular disorder ([place])   . Palpitation   . Panic attacks   . Panic attacks   . Parkinson's disease [facility])     Past Surgical History:  Procedure Laterality Date  . APPENDECTOMY    . COLONOSCOPY WITH PROPOFOL N/A 10/13/2018   Procedure: COLONOSCOPY WITH PROPOFOL;  Surgeon: Lollie Sails, MD;  Location: [facility] ENDOSCOPY;  Service: Endoscopy;  Laterality: N/A;  . TONSILLECTOMY      Family Psychiatric History: I have reviewed family psychiatric history from my progress note on 61/22/2020.  Family History:  Family History  Problem Relation Age of Onset  . Mental illness Neg Hx     Social History: Reviewed social history from my progress note on 61/22/2020. Social History   Socioeconomic History  . Marital status: Married    Spouse name: teresa  . Number of children: 2  . Years of education: Not on file  . Highest education level: High school graduate  Occupational History  . Not on file  Social Needs  . Financial  resource strain: Not hard at all  . Food insecurity    Worry: Never true    Inability: Never true  . Transportation needs    Medical: No    Non-medical: No  Tobacco Use  . Smoking status: Former Smoker    Packs/day: 2.00    Years: 10.00    Pack years: 20.00    Quit date: 10/17/1992    Years since quitting: 26.7  . Smokeless tobacco: Never Used  Substance and Sexual Activity  . Alcohol use: No  . Drug use: No  . Sexual activity: Not Currently  Lifestyle  . Physical activity    Days per week: 0 days     Minutes per session: 0 min  . Stress: Very much  Relationships  . Social Herbalist on phone: Not on file    Gets together: Not on file    Attends religious service: More than 4 times per year    Active member of club or organization: No    Attends meetings of clubs or organizations: Never    Relationship status: Married  Other Topics Concern  . Not on file  Social History Narrative  . Not on file    Allergies:  Allergies  Allergen Reactions  . Ibuprofen Anaphylaxis  . Penicillins Anaphylaxis    Has patient had a PCN reaction causing immediate rash, facial/tongue/throat swelling, SOB or lightheadedness with hypotension: Yes Has patient had a PCN reaction causing severe rash involving mucus membranes or skin necrosis: Yes Has patient had a PCN reaction that required hospitalization Yes Has patient had a PCN reaction occurring within the last 10 years: Yes If all of the above answers are "NO", then may proceed with Cephalosporin use.   . Prednisone Palpitations  . Citalopram     dizziness   . Aspirin Palpitations    Metabolic Disorder Labs: No results found for: HGBA1C, MPG No results found for: PROLACTIN Lab Results  Component Value Date   CHOL 178 11/18/2015   TRIG 167 (H) 11/18/2015   HDL 34 (L) 11/18/2015   CHOLHDL 5.2 11/18/2015   VLDL 33 11/18/2015   LDLCALC 111 (H) 11/18/2015   No results found for: TSH  Therapeutic Level Labs: No results found for: LITHIUM No results found for: VALPROATE No components found for:  CBMZ  Current Medications: Current Outpatient Medications  Medication Sig Dispense Refill  . tamsulosin (FLOMAX) 0.4 MG CAPS capsule Take by mouth.    Marland Kitchen acetaminophen (TYLENOL) 500 MG tablet Take 1-2 tablets by mouth every 6 (six) hours as needed.    . carbidopa-levodopa (SINEMET IR) 25-250 MG tablet Take 1 tablet by mouth 3 (three) times daily.    . clonazePAM (KLONOPIN) 0.5 MG tablet Take 1 tablet by mouth 3 (three) times daily.  0   . colchicine 0.6 MG tablet Take by mouth.    . colchicine 0.6 MG tablet Take by mouth.    . diazepam (VALIUM) 5 MG tablet Take 5 mg 30 minutes prior to MRI    . docusate sodium (COLACE) 100 MG capsule Take 100 mg by mouth 2 (two) times daily.    Marland Kitchen gabapentin (NEURONTIN) 600 MG tablet Take 600 mg by mouth at bedtime.    . hydrOXYzine (VISTARIL) 25 MG capsule Take 1 capsule (25 mg total) by mouth at bedtime as needed. For sleep 90 capsule 3  . ketoconazole (NIZORAL) 2 % shampoo APPLY TOPICALLY ONCE AND LEAVE ON FOR 5 MINUTES THEN RINSE. THEN USE TWICE  WEEKLY FOR 4 WEEKS, THEN AS NEEDED    . mirtazapine (REMERON) 15 MG tablet Take 1.5 tablets (22.5 mg total) by mouth at bedtime. Anxiety and sleep 135 tablet 1  . Polyethylene Glycol 3350 (PEG 3350) 17 GM/SCOOP POWD Take by mouth.    . propranolol (INDERAL) 10 MG tablet Take 1 tablet (10 mg total) by mouth 3 (three) times daily as needed. For severe panic attacks 90 tablet 3  . rOPINIRole (REQUIP) 2 MG tablet Take by mouth.    Marland Kitchen tiZANidine (ZANAFLEX) 2 MG tablet Take by mouth.    . vitamin B-12 (CYANOCOBALAMIN) 1000 MCG tablet Take by mouth.     No current facility-administered medications for this visit.      Musculoskeletal: Strength & Muscle Tone: UTA Gait & Station: Reports as WNL Patient leans: N/A  Psychiatric Specialty Exam: Review of Systems  Neurological: Positive for tremors.  Psychiatric/Behavioral: The patient is not nervous/anxious.   All other systems reviewed and are negative.   There were no vitals taken for this visit.There is no height or weight on file to calculate BMI.  General Appearance: UTA  Eye Contact:  UTA  Speech:  Clear and Coherent  Volume:  Normal  Mood:  Euthymic  Affect:  UTA  Thought Process:  Goal Directed and Descriptions of Associations: Intact  Orientation:  Full (Time, Place, and Person)  Thought Content: Logical   Suicidal Thoughts:  No  Homicidal Thoughts:  No  Memory:  Immediate;    Fair Recent;   Fair Remote;   Fair  Judgement:  Fair  Insight:  Fair  Psychomotor Activity:  UTA  Concentration:  Concentration: Fair and Attention Span: Fair  Recall:  [employer] of Knowledge: Fair  Language: Fair  Akathisia:  No  Handed:  Right  AIMS (if indicated): UTA  Assets:  Communication Skills Desire for Improvement Social Support  ADL's:  Intact  Cognition: WNL  Sleep:  Fair   Screenings:   Assessment and Plan: Gabriel Dillon is a 63 year old Caucasian male, married, on disability, lives in [place], has a history of Parkinson's disease, anxiety, restless leg syndrome, BPH, chronic pain was evaluated by telemedicine today.  Patient is biologically predisposed given his history of chronic medical problems.  He also has current psychosocial stressors of physical limitations due to Parkinson's disease and the pandemic however overall he is doing well on the current medication regimen.  Plan as noted below.  Plan Panic attacks-stable Patient is on Klonopin prescribed by neurology.  Advised patient to continue to limit use Propranolol 10 mg p.o. 3 times daily as needed for severe anxiety attacks. Remeron 22.5 mg p.o. nightly  GAD-stable Mirtazapine as prescribed   Insomnia-stable Mirtazapine will help Requip 2 mg p.o. nightly for restless leg syndrome prescribed by neurology Also discussed getting melatonin over-the-counter-2 mg p.o. nightly Hydroxyzine 25 mg p.o. nightly as needed for sleep and anxiety.  Follow-up in clinic in 3 months or sooner if needed.  March 4 at 10 AM  I have spent atleast 15 minutes non face to face with patient today. More than 50 % of the time was spent for psychoeducation and supportive psychotherapy and care coordination. This note was generated in part or whole with voice recognition software. Voice recognition is usually quite accurate but there are transcription errors that can and very often do occur. I apologize for any typographical  errors that were not detected and corrected.        Ursula Alert, MD 07/21/2019, 11:42 AM

## 2019-07-26 ENCOUNTER — Other Ambulatory Visit: Payer: Self-pay

## 2019-07-26 ENCOUNTER — Ambulatory Visit (INDEPENDENT_AMBULATORY_CARE_PROVIDER_SITE_OTHER): Payer: Medicare Other | Admitting: Licensed Clinical Social Worker

## 2019-07-26 ENCOUNTER — Encounter: Payer: Self-pay | Admitting: Licensed Clinical Social Worker

## 2019-07-26 DIAGNOSIS — F410 Panic disorder [episodic paroxysmal anxiety] without agoraphobia: Secondary | ICD-10-CM

## 2019-07-26 NOTE — Progress Notes (Signed)
Virtual Visit via Telephone Note  I connected with [name] on 07/26/19 at 10:00 AM EST by telephone and verified that I am speaking with the correct person using two identifiers.   I discussed the limitations, risks, security and privacy concerns of performing an evaluation and management service by telephone and the availability of in person appointments. I also discussed with the patient that there may be a patient responsible charge related to this service. The patient expressed understanding and agreed to proceed.  I discussed the assessment and treatment plan with the patient. The patient was provided an opportunity to ask questions and all were answered. The patient agreed with the plan and demonstrated an understanding of the instructions.   The patient was advised to call back or seek an in-person evaluation if the symptoms worsen or if the condition fails to improve as anticipated.  I provided 30 minutes of non-face-to-face time during this encounter.   Alden Hipp, LCSW    THERAPIST PROGRESS NOTE  Session Time: 1000  Participation Level: Minimal  Behavioral Response: NeatAlertAnxious  Type of Therapy: Individual Therapy  Treatment Goals addressed: Coping  Interventions: CBT  Summary: Emanuel Stroble is a 63 y.o. male who presents with continued symptoms related to his diagnosis. Dixon reports doing well since our last session. He noted, "not a lot has changed! I'm just bored in the house." LCSW validated this idea and encouraged Wendy to elaborate on the state of his mental health. Rondrick reported feeling anxious at times, but noted his panic attacks have been minimal. LCSW validated this idea, and encouraged Gilverto to revisit things we've discussed in previous sessions--such as attempting to figure out things he can do in the house to occupy his time or distract him when feeling anxious. LCSW also reviewed CBT components that Manan could utilize to manage his anxiety  in the moment. Dhani reported he had not utilized those skills, and "I've just kind of been dealing with it." LCSW encouraged Trung to try to use the skills, and LCSW emailed several handsouts to Petty during the session so he can utilize them prior to our next session. Davante expressed understanding and agreement with this information.   Suicidal/Homicidal: No  Therapist Response:  Katron continues to work towards his tx goals but has not yet reached them. We will continue to work on emotional regulation skills and improving distress tolerance moving forward.   Plan: Return again in 12 weeks.  Diagnosis: Axis I: Panic Disorder    Axis II: No diagnosis    Alden Hipp, LCSW 07/26/2019

## 2019-09-08 ENCOUNTER — Encounter: Payer: Self-pay | Admitting: Psychiatry

## 2019-09-08 ENCOUNTER — Other Ambulatory Visit: Payer: Self-pay

## 2019-09-08 ENCOUNTER — Ambulatory Visit (INDEPENDENT_AMBULATORY_CARE_PROVIDER_SITE_OTHER): Payer: Medicare Other | Admitting: Psychiatry

## 2019-09-08 DIAGNOSIS — G4701 Insomnia due to medical condition: Secondary | ICD-10-CM | POA: Diagnosis not present

## 2019-09-08 DIAGNOSIS — F411 Generalized anxiety disorder: Secondary | ICD-10-CM

## 2019-09-08 DIAGNOSIS — F410 Panic disorder [episodic paroxysmal anxiety] without agoraphobia: Secondary | ICD-10-CM

## 2019-09-08 MED ORDER — VENLAFAXINE HCL ER 37.5 MG PO CP24: 37.5000 mg | ORAL_CAPSULE | Freq: Every day | ORAL | 0 refills | Status: DC

## 2019-09-08 MED ORDER — MIRTAZAPINE 15 MG PO TABS: 15.0000 mg | ORAL_TABLET | Freq: Every day | ORAL | 0 refills | Status: DC

## 2019-09-08 NOTE — Progress Notes (Signed)
Provider Location : [facility] Patient Location : Home  Virtual Visit via Telephone Note  I connected with Gabriel Dillon on 09/08/19 at  4:30 PM EST by telephone and verified that I am speaking with the correct person using two identifiers.   I discussed the limitations, risks, security and privacy concerns of performing an evaluation and management service by telephone and the availability of in person appointments. I also discussed with the patient that there may be a patient responsible charge related to this service. The patient expressed understanding and agreed to proceed.      I discussed the assessment and treatment plan with the patient. The patient was provided an opportunity to ask questions and all were answered. The patient agreed with the plan and demonstrated an understanding of the instructions.   The patient was advised to call back or seek an in-person evaluation if the symptoms worsen or if the condition fails to improve as anticipated.  [place] MD OP Progress Note  09/08/2019 5:56 PM Gabriel Dillon  MRN:  [id number]  Chief Complaint:  Chief Complaint    Follow-up     HPI: Gabriel Dillon is a 64 year old Caucasian male, married, lives in [place], has a history of panic attacks, GAD, insomnia, restless leg syndrome, Parkinson's disease, chronic pain, BPH, insomnia was evaluated by phone today.  Patient preferred to do a phone call.  Patient reports he is currently struggling with a lot of anxiety symptoms.  He feels restless and nervous a lot.  Patient reports he continues to take Klonopin prescribed by neurology which helps.  He however reports he has a lot of pain and he was recently seen by pain provider who told him he cannot be started on opioid medications unless he comes off of the Klonopin.  Patient denies any suicidality, homicidality or perceptual disturbances.  Patient appeared to be alert oriented to person place time and situation.  Discussed starting Effexor.   Patient agrees with plan.  Patient denies any other concerns today. Visit Diagnosis:    ICD-10-CM   1. Panic disorder  F41.0 mirtazapine (REMERON) 15 MG tablet    venlafaxine XR (EFFEXOR-XR) 37.5 MG 24 hr capsule  2. GAD (generalized anxiety disorder)  F41.1   3. Insomnia due to medical condition  G47.01     Past Psychiatric History: Reviewed past psychiatric history from my progress note on 12/09/2018.  Past trials of Klonopin, Valium, Zoloft, Celexa, Lexapro, BuSpar.  Past Medical History:  Past Medical History:  Diagnosis Date  . Allergic rhinitis   . Back pain   . Enlarged prostate   . Fatty liver   . Gout   . Nephrolithiasis   . Neuromuscular disorder ([place])   . Palpitation   . Panic attacks   . Panic attacks   . Parkinson's disease [facility])     Past Surgical History:  Procedure Laterality Date  . APPENDECTOMY    . COLONOSCOPY WITH PROPOFOL N/A 10/13/2018   Procedure: COLONOSCOPY WITH PROPOFOL;  Surgeon: Lollie Sails, MD;  Location: [facility]COPY;  Service: Endoscopy;  Laterality: N/A;  . TONSILLECTOMY      Family Psychiatric History: Reviewed family psychiatric history from my progress note on 12/09/2018.  Family History:  Family History  Problem Relation Age of Onset  . Mental illness Neg Hx     Social History: Reviewed social history from my progress note on 12/09/2018. Social History   Socioeconomic History  . Marital status: Married    Spouse name: teresa  . Number of  children: 2  . Years of education: Not on file  . Highest education level: High school graduate  Occupational History  . Not on file  Tobacco Use  . Smoking status: Former Smoker    Packs/day: 2.00    Years: 10.00    Pack years: 20.00    Quit date: 10/17/1992    Years since quitting: 26.9  . Smokeless tobacco: Never Used  Substance and Sexual Activity  . Alcohol use: No  . Drug use: No  . Sexual activity: Not Currently  Other Topics Concern  . Not on file  Social History  Narrative  . Not on file   Social Determinants of Health   Financial Resource Strain: Low Risk   . Difficulty of Paying Living Expenses: Not hard at all  Food Insecurity: No Food Insecurity  . Worried About Charity fundraiser in the Last Year: Never true  . Ran Out of Food in the Last Year: Never true  Transportation Needs: No Transportation Needs  . Lack of Transportation (Medical): No  . Lack of Transportation (Non-Medical): No  Physical Activity: Inactive  . Days of Exercise per Week: 0 days  . Minutes of Exercise per Session: 0 min  Stress: Stress Concern Present  . Feeling of Stress : Very much  Social Connections: Unknown  . Frequency of Communication with Friends and Family: Not on file  . Frequency of Social Gatherings with Friends and Family: Not on file  . Attends Religious Services: More than 4 times per year  . Active Member of Clubs or Organizations: No  . Attends Archivist Meetings: Never  . Marital Status: Married    Allergies:  Allergies  Allergen Reactions  . Ibuprofen Anaphylaxis  . Penicillins Anaphylaxis    Has patient had a PCN reaction causing immediate rash, facial/tongue/throat swelling, SOB or lightheadedness with hypotension: Yes Has patient had a PCN reaction causing severe rash involving mucus membranes or skin necrosis: Yes Has patient had a PCN reaction that required hospitalization Yes Has patient had a PCN reaction occurring within the last 10 years: Yes If all of the above answers are "NO", then may proceed with Cephalosporin use.   . Prednisone Palpitations  . Citalopram     dizziness   . Aspirin Palpitations    Metabolic Disorder Labs: No results found for: HGBA1C, MPG No results found for: PROLACTIN Lab Results  Component Value Date   CHOL 178 11/18/2015   TRIG 167 (H) 11/18/2015   HDL 34 (L) 11/18/2015   CHOLHDL 5.2 11/18/2015   VLDL 33 11/18/2015   LDLCALC 111 (H) 11/18/2015   No results found for:  TSH  Therapeutic Level Labs: No results found for: LITHIUM No results found for: VALPROATE No components found for:  CBMZ  Current Medications: Current Outpatient Medications  Medication Sig Dispense Refill  . acetaminophen (TYLENOL) 500 MG tablet Take 1-2 tablets by mouth every 6 (six) hours as needed.    . carbidopa-levodopa (SINEMET IR) 25-250 MG tablet Take 1 tablet by mouth 3 (three) times daily.    . clonazePAM (KLONOPIN) 0.5 MG tablet Take 1 tablet by mouth 3 (three) times daily.  0  . colchicine 0.6 MG tablet Take by mouth.    . colchicine 0.6 MG tablet Take by mouth.    . diazepam (VALIUM) 5 MG tablet Take 5 mg 30 minutes prior to MRI    . docusate sodium (COLACE) 100 MG capsule Take 100 mg by mouth 2 (two) times daily.    Marland Kitchen  gabapentin (NEURONTIN) 600 MG tablet Take 600 mg by mouth at bedtime.    . hydrOXYzine (VISTARIL) 25 MG capsule Take 1 capsule (25 mg total) by mouth at bedtime as needed. For sleep 90 capsule 3  . ketoconazole (NIZORAL) 2 % shampoo APPLY TOPICALLY ONCE AND LEAVE ON FOR 5 MINUTES THEN RINSE. THEN USE TWICE WEEKLY FOR 4 WEEKS, THEN AS NEEDED    . mirtazapine (REMERON) 15 MG tablet Take 1 tablet (15 mg total) by mouth at bedtime. Anxiety and sleep 90 tablet 0  . Polyethylene Glycol 3350 (PEG 3350) 17 GM/SCOOP POWD Take by mouth.    . propranolol (INDERAL) 10 MG tablet Take 1 tablet (10 mg total) by mouth 3 (three) times daily as needed. For severe panic attacks 90 tablet 3  . rOPINIRole (REQUIP) 2 MG tablet Take by mouth.    . tamsulosin (FLOMAX) 0.4 MG CAPS capsule Take by mouth.    Marland Kitchen tiZANidine (ZANAFLEX) 2 MG tablet Take by mouth.    . venlafaxine XR (EFFEXOR-XR) 37.5 MG 24 hr capsule Take 1 capsule (37.5 mg total) by mouth daily with breakfast. 90 capsule 0  . vitamin B-12 (CYANOCOBALAMIN) 1000 MCG tablet Take by mouth.     No current facility-administered medications for this visit.     Musculoskeletal: Strength & Muscle Tone: UTA Gait & Station:  UTA Patient leans: N/A  Psychiatric Specialty Exam: Review of Systems  Musculoskeletal: Positive for arthralgias and myalgias.  Neurological: Positive for tremors.  Psychiatric/Behavioral: The patient is nervous/anxious.   All other systems reviewed and are negative.   There were no vitals taken for this visit.There is no height or weight on file to calculate BMI.  General Appearance: UTA  Eye Contact:  UTA  Speech:  Clear and Coherent  Volume:  Normal  Mood:  Anxious  Affect:  UTA  Thought Process:  Goal Directed and Descriptions of Associations: Intact  Orientation:  Full (Time, Place, and Person)  Thought Content: Logical   Suicidal Thoughts:  No  Homicidal Thoughts:  No  Memory:  Immediate;   Fair Recent;   Fair Remote;   Fair  Judgement:  Fair  Insight:  Fair  Psychomotor Activity:  UTA  Concentration:  Concentration: Fair and Attention Span: Fair  Recall:  [employer] of Knowledge: Fair  Language: Fair  Akathisia:  No  Handed:  Right  AIMS (if indicated): UTA  Assets:  Chief Executive Officer Social Support  ADL's:  Intact  Cognition: WNL  Sleep:  Fair   Screenings:   Assessment and Plan: Per is a 64 year old Caucasian male, married, on disability, lives in [place], has a history of Parkinson's disease, anxiety, restless leg syndrome, BPH, chronic pain was evaluated by telemedicine today.  Patient is biologically predisposed given his history of chronic medical problems.  He also has current psychosocial stressors of physical limitations due to Parkinson's disease and the pandemic.  Patient continues to struggle with anxiety symptoms and will benefit from medication readjustment.  Plan as noted below.  Plan Panic attacks-unstable Added Effexor extended release 37.5 mg p.o. daily with breakfast Reduce Remeron to 15 mg p.o. nightly Propranolol 10 mg p.o. 3 times daily as needed for severe anxiety attacks Continue Klonopin as prescribed per  neurology.  GAD-unstable Effexor as prescribed. Reduce Remeron to 15 mg p.o. nightly  Insomnia-stable Remeron 15 mg p.o. nightly Requip 2 mg p.o. nightly for restless leg syndrome prescribed by neurology Melatonin over-the-counter-2 mg p.o. nightly Hydroxyzine 25 mg p.o. nightly as  needed for sleep and anxiety.  Follow-up in clinic in 2 to 3 weeks or sooner if needed.  February 9 at 4:40 PM  I have spent atleast 20 minutes non face to face with patient today. More than 50 % of the time was spent for ordering medications and test ,psychoeducation and supportive psychotherapy and care coordination,as well as documenting clinical information in electronic health record. This note was generated in part or whole with voice recognition software. Voice recognition is usually quite accurate but there are transcription errors that can and very often do occur. I apologize for any typographical errors that were not detected and corrected.       Ursula Alert, MD 09/08/2019, 5:56 PM

## 2019-09-09 ENCOUNTER — Telehealth: Payer: Self-pay

## 2019-09-09 NOTE — Telephone Encounter (Signed)
avs mailed 

## 2019-09-21 DIAGNOSIS — R413 Other amnesia: Secondary | ICD-10-CM | POA: Insufficient documentation

## 2019-09-22 DIAGNOSIS — I890 Lymphedema, not elsewhere classified: Secondary | ICD-10-CM | POA: Insufficient documentation

## 2019-09-22 DIAGNOSIS — E782 Mixed hyperlipidemia: Secondary | ICD-10-CM | POA: Insufficient documentation

## 2019-09-27 ENCOUNTER — Encounter: Payer: Self-pay | Admitting: Licensed Clinical Social Worker

## 2019-09-27 ENCOUNTER — Other Ambulatory Visit: Payer: Self-pay

## 2019-09-27 ENCOUNTER — Ambulatory Visit (INDEPENDENT_AMBULATORY_CARE_PROVIDER_SITE_OTHER): Payer: Medicare Other | Admitting: Licensed Clinical Social Worker

## 2019-09-27 DIAGNOSIS — F410 Panic disorder [episodic paroxysmal anxiety] without agoraphobia: Secondary | ICD-10-CM

## 2019-09-27 DIAGNOSIS — F411 Generalized anxiety disorder: Secondary | ICD-10-CM | POA: Diagnosis not present

## 2019-09-27 NOTE — Progress Notes (Signed)
Virtual Visit via Video Note  I connected with Gabriel Dillon on 09/27/19 at 10:00 AM EST by a video enabled telemedicine application and verified that I am speaking with the correct person using two identifiers.   I discussed the limitations of evaluation and management by telemedicine and the availability of in person appointments. The patient expressed understanding and agreed to proceed.    I discussed the assessment and treatment plan with the patient. The patient was provided an opportunity to ask questions and all were answered. The patient agreed with the plan and demonstrated an understanding of the instructions.   The patient was advised to call back or seek an in-person evaluation if the symptoms worsen or if the condition fails to improve as anticipated.  I provided 17 minutes of non-face-to-face time during this encounter.   Gabriel Hipp, LCSW    THERAPIST PROGRESS NOTE  Session Time: 1000 Participation Level: Active  Behavioral Response: NeatAlertAnxious  Type of Therapy: Individual Therapy  Treatment Goals addressed: Anxiety  Interventions: CBT and Supportive  Summary: Gabriel Dillon is a 64 y.o. male who presents with continued symptoms of his diagnosis. Gabriel Dillon reports doing well since our last session. He reports he has continued having anxiety and panic at times, but his panic attacks are down from every day to a few times a week. LCSW validated this information and encouraged Gabriel Dillon to recognize that progress. Gabriel Dillon was able to recognize the progress and expressed pride in that. LCSW asked how Gabriel Dillon is managing his symptoms in the moment. Gabriel Dillon reports he has been utilizing breathing exercises, but has not tried any other techniques and has not viewed the materials sent via email. LCSW encouraged Gabriel Dillon to look at the resources, but also walked him through examples of several exercises he can utilize in the moment to reduce panic. Gabriel Dillon expressed understanding and  agreement. He reports also feeling anxious about decreasing the amount of Gabriel Dillon he's on, as he's been on the medication for 20 years. LCSW validated those feelings and provided psyhoeducation on the harmful impacts of long term benzo use. Gabriel Dillon expressed understanding and agreement.   Suicidal/Homicidal: No  Therapist Response: Gabriel Dillon continues to work towards his tx goals but has not yet reached them. We will continue to work on improving panic symptoms and anxiety symptoms moving forward.   Plan: Return again in 4 weeks.  Diagnosis: Axis I: Generalized Anxiety Disorder    Axis II: No diagnosis    Gabriel Hipp, LCSW 09/27/2019

## 2019-09-28 ENCOUNTER — Encounter: Payer: Self-pay | Admitting: Psychiatry

## 2019-09-28 ENCOUNTER — Ambulatory Visit (INDEPENDENT_AMBULATORY_CARE_PROVIDER_SITE_OTHER): Payer: Medicare Other | Admitting: Psychiatry

## 2019-09-28 DIAGNOSIS — G4701 Insomnia due to medical condition: Secondary | ICD-10-CM | POA: Diagnosis not present

## 2019-09-28 DIAGNOSIS — F411 Generalized anxiety disorder: Secondary | ICD-10-CM

## 2019-09-28 DIAGNOSIS — F410 Panic disorder [episodic paroxysmal anxiety] without agoraphobia: Secondary | ICD-10-CM

## 2019-09-28 MED ORDER — VENLAFAXINE HCL ER 37.5 MG PO CP24: 37.5000 mg | ORAL_CAPSULE | Freq: Every day | ORAL | 0 refills | Status: DC

## 2019-09-28 NOTE — Progress Notes (Signed)
Provider Location : [facility] Patient Location : Home   Virtual Visit via Telephone Dillon  I connected with Gabriel Dillon on 09/28/19 at  4:40 PM EST by telephone and verified that I am speaking with the correct person using two identifiers.   I discussed the limitations, risks, security and privacy concerns of performing an evaluation and management service by telephone and the availability of in person appointments. I also discussed with the patient that there may be a patient responsible charge related to this service. The patient expressed understanding and agreed to proceed.      I discussed the assessment and treatment plan with the patient. The patient was provided an opportunity to ask questions and all were answered. The patient agreed with the plan and demonstrated an understanding of the instructions.   The patient was advised to call back or seek an in-person evaluation if the symptoms worsen or if the condition fails to improve as anticipated.   Gabriel Dillon  09/28/2019 4:17 PM Gabriel Dillon  MRN:  [id number]  Chief Complaint:  Chief Complaint    Follow-up     HPI: Gabriel Dillon is a 64 year old Caucasian male, married, lives in [place], has a history of panic attacks, GAD, insomnia, restless leg syndrome, Parkinson's disease, chronic pain, BPH, insomnia was evaluated by phone today.  Patient preferred to do a phone call.  Patient today reports he did not start taking the venlafaxine as prescribed last visit.  It was prescribed for his anxiety symptoms and restlessness.  Patient reports he however reduced the dosage of Remeron and currently takes a 15 mg at bedtime as discussed last visit.  Collateral information was obtained from wife-[name] who reports that patient was worried about starting a new medication.  She however agrees that she will pick it up from the pharmacy and encourage patient to be compliant with the medication.  Patient currently denies any  suicidality, homicidality or perceptual disturbances.  Patient denies any other concerns today. Visit Diagnosis:    ICD-10-CM   1. Panic disorder  F41.0 venlafaxine XR (EFFEXOR-XR) 37.5 MG 24 hr capsule  2. GAD (generalized anxiety disorder)  F41.1   3. Insomnia due to medical condition  G47.01     Past Psychiatric History: Reviewed past psychiatric history from my progress Dillon on 12/09/2018.  Past trials of Klonopin, Valium, Zoloft, Celexa, Lexapro, BuSpar.  Past Medical History:  Past Medical History:  Diagnosis Date  . Allergic rhinitis   . Back pain   . Enlarged prostate   . Fatty liver   . Gout   . Nephrolithiasis   . Neuromuscular disorder ([place])   . Palpitation   . Panic attacks   . Panic attacks   . Parkinson's disease [facility])     Past Surgical History:  Procedure Laterality Date  . APPENDECTOMY    . COLONOSCOPY WITH PROPOFOL N/A 10/13/2018   Procedure: COLONOSCOPY WITH PROPOFOL;  Surgeon: Lollie Sails, MD;  Location: [facility] ENDOSCOPY;  Service: Endoscopy;  Laterality: N/A;  . TONSILLECTOMY      Family Psychiatric History: Reviewed family psychiatric history from my progress Dillon on 12/09/2018.  Family History:  Family History  Problem Relation Age of Onset  . Mental illness Neg Hx     Social History: Reviewed social history from my progress Dillon on 12/09/2018. Social History   Socioeconomic History  . Marital status: Married    Spouse name: teresa  . Number of children: 2  . Years of education: Not on  file  . Highest education level: High school graduate  Occupational History  . Not on file  Tobacco Use  . Smoking status: Former Smoker    Packs/day: 2.00    Years: 10.00    Pack years: 20.00    Quit date: 10/17/1992    Years since quitting: 26.9  . Smokeless tobacco: Never Used  Substance and Sexual Activity  . Alcohol use: No  . Drug use: No  . Sexual activity: Not Currently  Other Topics Concern  . Not on file  Social History Narrative  . Not  on file   Social Determinants of Health   Financial Resource Strain: Low Risk   . Difficulty of Paying Living Expenses: Not hard at all  Food Insecurity: No Food Insecurity  . Worried About Charity fundraiser in the Last Year: Never true  . Ran Out of Food in the Last Year: Never true  Transportation Needs: No Transportation Needs  . Lack of Transportation (Medical): No  . Lack of Transportation (Non-Medical): No  Physical Activity: Inactive  . Days of Exercise per Week: 0 days  . Minutes of Exercise per Session: 0 min  Stress: Stress Concern Present  . Feeling of Stress : Very much  Social Connections: Unknown  . Frequency of Communication with Friends and Family: Not on file  . Frequency of Social Gatherings with Friends and Family: Not on file  . Attends Religious Services: More than 4 times per year  . Active Member of Clubs or Organizations: No  . Attends Archivist Meetings: Never  . Marital Status: Married    Allergies:  Allergies  Allergen Reactions  . Ibuprofen Anaphylaxis  . Penicillins Anaphylaxis    Has patient had a PCN reaction causing immediate rash, facial/tongue/throat swelling, SOB or lightheadedness with hypotension: Yes Has patient had a PCN reaction causing severe rash involving mucus membranes or skin necrosis: Yes Has patient had a PCN reaction that required hospitalization Yes Has patient had a PCN reaction occurring within the last 10 years: Yes If all of the above answers are "NO", then may proceed with Cephalosporin use.   . Prednisone Palpitations  . Citalopram     dizziness   . Aspirin Palpitations    Metabolic Disorder Labs: No results found for: HGBA1C, MPG No results found for: PROLACTIN Lab Results  Component Value Date   CHOL 178 11/18/2015   TRIG 167 (H) 11/18/2015   HDL 34 (L) 11/18/2015   CHOLHDL 5.2 11/18/2015   VLDL 33 11/18/2015   LDLCALC 111 (H) 11/18/2015   No results found for: TSH  Therapeutic Level  Labs: No results found for: LITHIUM No results found for: VALPROATE No components found for:  CBMZ  Current Medications: Current Outpatient Medications  Medication Sig Dispense Refill  . clonazePAM (KLONOPIN) 0.5 MG tablet TAKE ONE TABLET BY MOUTH THREE TIMES A DAY    . donepezil (ARICEPT) 5 MG tablet Take 5 mg nightly    . acetaminophen (TYLENOL) 500 MG tablet Take 1-2 tablets by mouth every 6 (six) hours as needed.    . carbidopa-levodopa (SINEMET IR) 25-250 MG tablet Take 1 tablet by mouth 3 (three) times daily.    . clonazePAM (KLONOPIN) 0.5 MG tablet Take 1 tablet by mouth 3 (three) times daily.  0  . colchicine 0.6 MG tablet Take by mouth.    . colchicine 0.6 MG tablet Take by mouth.    . diazepam (VALIUM) 5 MG tablet Take 5 mg 30 minutes  prior to MRI    . docusate sodium (COLACE) 100 MG capsule Take 100 mg by mouth 2 (two) times daily.    Marland Kitchen gabapentin (NEURONTIN) 600 MG tablet Take 600 mg by mouth at bedtime.    . hydrOXYzine (VISTARIL) 25 MG capsule Take 1 capsule (25 mg total) by mouth at bedtime as needed. For sleep 90 capsule 3  . ketoconazole (NIZORAL) 2 % shampoo APPLY TOPICALLY ONCE AND LEAVE ON FOR 5 MINUTES THEN RINSE. THEN USE TWICE WEEKLY FOR 4 WEEKS, THEN AS NEEDED    . mirtazapine (REMERON) 15 MG tablet Take 1 tablet (15 mg total) by mouth at bedtime. Anxiety and sleep 90 tablet 0  . Polyethylene Glycol 3350 (PEG 3350) 17 GM/SCOOP POWD Take by mouth.    . propranolol (INDERAL) 10 MG tablet Take 1 tablet (10 mg total) by mouth 3 (three) times daily as needed. For severe panic attacks 90 tablet 3  . rOPINIRole (REQUIP) 2 MG tablet Take by mouth.    . tamsulosin (FLOMAX) 0.4 MG CAPS capsule Take by mouth.    Marland Kitchen tiZANidine (ZANAFLEX) 2 MG tablet Take by mouth.    . venlafaxine XR (EFFEXOR-XR) 37.5 MG 24 hr capsule Take 1 capsule (37.5 mg total) by mouth daily with breakfast. 90 capsule 0  . vitamin B-12 (CYANOCOBALAMIN) 1000 MCG tablet Take by mouth.     No current  facility-administered medications for this visit.     Musculoskeletal: Strength & Muscle Tone: UTA Gait & Station: Reports as WNL Patient leans: N/A  Psychiatric Specialty Exam: Review of Systems  Neurological: Positive for tremors (Chronic).  Psychiatric/Behavioral: The patient is nervous/anxious.   All other systems reviewed and are negative.   There were no vitals taken for this visit.There is no height or weight on file to calculate BMI.  General Appearance: UTA  Eye Contact:  UTA  Speech:  Clear and Coherent  Volume:  Normal  Mood:  Anxious  Affect:  UTA  Thought Process:  Goal Directed and Descriptions of Associations: Intact  Orientation:  Full (Time, Place, and Person)  Thought Content: Logical   Suicidal Thoughts:  No  Homicidal Thoughts:  No  Memory:  Immediate;   Fair Recent;   Fair Remote;   Fair  Judgement:  Fair  Insight:  Fair  Psychomotor Activity:  UTA  Concentration:  Concentration: Fair and Attention Span: Fair  Recall:  [employer] of Knowledge: Fair  Language: Fair  Akathisia:  No  Handed:  Right  AIMS (if indicated): UTA  Assets:  Communication Skills Desire for Improvement Housing Intimacy Social Support  ADL's:  Intact  Cognition: WNL  Sleep:  Fair   Screenings:   Assessment and Plan: Gabriel Dillon is a 64 year old Caucasian male, married, disability, lives in [place], has a history of Parkinson's disease, anxiety, restless leg syndrome, BPH, chronic pain was evaluated by telemedicine today.  Patient is biologically predisposed given his history of chronic medical problems.  He also has current psychosocial stressors of  limitations due to the pandemic as well as Parkinson's disease.  Patient has been noncompliant with medication since his last visit, encouraged compliance.  Plan as noted below.  Plan Panic attacks-unstable Encouraged patient to start Effexor extended release 37.5 mg p.o. daily with breakfast Remeron at reduced dose to 15  mg p.o. nightly Propranolol 10 mg p.o. 3 times daily as needed for anxiety attacks Continue Klonopin as prescribed per neurology.  GAD-unstable Encouraged patient to start Effexor as prescribed. Remeron 15 mg  p.o. nightly  Insomnia-stable Remeron 15 mg p.o. nightly Requip 2 mg p.o. nightly for restless leg syndrome prescribed per neurology Melatonin over-the-counter 2 mg p.o. nightly Hydroxyzine 25 mg p.o. nightly as needed for sleep and anxiety  Collateral information was obtained from wife Helene Kelp who agrees to encourage patient to start the medication.  Follow-up in clinic in 3 to 4 weeks or sooner if needed.  March 9 at 3 PM  I have spent atleast 20 minutes non face to face with patient today. More than 50 % of the time was spent for preparing to see the patient ( e.g., review of test, records ), obtaining and to review and separately obtained history , ordering medications and test ,psychoeducation and supportive psychotherapy and care coordination,as well as documenting clinical information in electronic health record. This Dillon was generated in part or whole with voice recognition software. Voice recognition is usually quite accurate but there are transcription errors that can and very often do occur. I apologize for any typographical errors that were not detected and corrected.       Ursula Alert, MD 09/28/2019, 4:17 PM

## 2019-09-30 DIAGNOSIS — R7302 Impaired glucose tolerance (oral): Secondary | ICD-10-CM | POA: Insufficient documentation

## 2019-10-21 ENCOUNTER — Ambulatory Visit: Payer: Medicare Other | Admitting: Psychiatry

## 2019-10-26 ENCOUNTER — Ambulatory Visit (INDEPENDENT_AMBULATORY_CARE_PROVIDER_SITE_OTHER): Payer: Medicare Other | Admitting: Psychiatry

## 2019-10-26 ENCOUNTER — Encounter: Payer: Self-pay | Admitting: Psychiatry

## 2019-10-26 ENCOUNTER — Other Ambulatory Visit: Payer: Self-pay

## 2019-10-26 DIAGNOSIS — G3184 Mild cognitive impairment, so stated: Secondary | ICD-10-CM | POA: Insufficient documentation

## 2019-10-26 DIAGNOSIS — F411 Generalized anxiety disorder: Secondary | ICD-10-CM | POA: Diagnosis not present

## 2019-10-26 DIAGNOSIS — R2689 Other abnormalities of gait and mobility: Secondary | ICD-10-CM | POA: Insufficient documentation

## 2019-10-26 DIAGNOSIS — F410 Panic disorder [episodic paroxysmal anxiety] without agoraphobia: Secondary | ICD-10-CM

## 2019-10-26 DIAGNOSIS — R531 Weakness: Secondary | ICD-10-CM | POA: Insufficient documentation

## 2019-10-26 DIAGNOSIS — G4701 Insomnia due to medical condition: Secondary | ICD-10-CM

## 2019-10-26 DIAGNOSIS — G4733 Obstructive sleep apnea (adult) (pediatric): Secondary | ICD-10-CM | POA: Insufficient documentation

## 2019-10-26 MED ORDER — VENLAFAXINE HCL ER 75 MG PO CP24: 75.0000 mg | ORAL_CAPSULE | Freq: Every day | ORAL | 0 refills | Status: DC

## 2019-10-26 NOTE — Progress Notes (Signed)
Provider Location : [facility] Patient Location : Home  Virtual Visit via Telephone Note  I connected with [name] on 10/26/19 at  3:00 PM EST by telephone and verified that I am speaking with the correct person using two identifiers.   I discussed the limitations, risks, security and privacy concerns of performing an evaluation and management service by telephone and the availability of in person appointments. I also discussed with the patient that there may be a patient responsible charge related to this service. The patient expressed understanding and agreed to proceed.     I discussed the assessment and treatment plan with the patient. The patient was provided an opportunity to ask questions and all were answered. The patient agreed with the plan and demonstrated an understanding of the instructions.   The patient was advised to call back or seek an in-person evaluation if the symptoms worsen or if the condition fails to improve as anticipated.   [place] MD OP Progress Note  10/26/2019 5:11 PM Elham Wyles  MRN:  [id number]  Chief Complaint:  Chief Complaint    Follow-up     HPI: Gabriel Dillon is a 64 year old Caucasian male, married, lives in [place], has a history of panic attacks, GAD, insomnia, restless leg syndrome, Parkinson's disease, chronic pain, BPH, insomnia was evaluated by phone today.  Patient preferred to do a phone call.  Patient today reports he continues to struggle with anxiety symptoms.  He denies any side effects to venlafaxine which was prescribed last visit.  He reports sleep is affected.  He has a lot of restlessness of his legs and cramps at night which makes it difficult for him to sleep.  He reports he had neurology appointment today with Dr. Melrose Nakayama and he was restarted on gabapentin.  He is hopeful that it will help him to get some sleep at night.  Patient denies any suicidality, homicidality or perceptual disturbances.  Collateral information was obtained  from [name]-patient's wife who reports he is compliant on the Effexor as prescribed.  She has not observed any side effects.   Visit Diagnosis:    ICD-10-CM   1. Panic disorder  F41.0 venlafaxine XR (EFFEXOR XR) 75 MG 24 hr capsule  2. GAD (generalized anxiety disorder)  F41.1 venlafaxine XR (EFFEXOR XR) 75 MG 24 hr capsule  3. Insomnia due to medical condition  G47.01     Past Psychiatric History: I have reviewed past psychiatric history from my progress note on 12/09/2018.  Past trials of Klonopin, Valium, Zoloft, Celexa, Lexapro, BuSpar  Past Medical History:  Past Medical History:  Diagnosis Date  . Allergic rhinitis   . Back pain   . Enlarged prostate   . Fatty liver   . Gout   . Nephrolithiasis   . Neuromuscular disorder ([place])   . Palpitation   . Panic attacks   . Panic attacks   . Parkinson's disease [facility])     Past Surgical History:  Procedure Laterality Date  . APPENDECTOMY    . COLONOSCOPY WITH PROPOFOL N/A 10/13/2018   Procedure: COLONOSCOPY WITH PROPOFOL;  Surgeon: Lollie Sails, MD;  Location: [facility] ENDOSCOPY;  Service: Endoscopy;  Laterality: N/A;  . TONSILLECTOMY      Family Psychiatric History: I have reviewed family psychiatric history from my progress note on 12/09/2018  Family History:  Family History  Problem Relation Age of Onset  . Mental illness Neg Hx     Social History: I have reviewed social history from my progress note on 12/09/2018  Social History   Socioeconomic History  . Marital status: Married    Spouse name: teresa  . Number of children: 2  . Years of education: Not on file  . Highest education level: High school graduate  Occupational History  . Not on file  Tobacco Use  . Smoking status: Former Smoker    Packs/day: 2.00    Years: 10.00    Pack years: 20.00    Quit date: 10/17/1992    Years since quitting: 27.0  . Smokeless tobacco: Never Used  Substance and Sexual Activity  . Alcohol use: No  . Drug use: No  . Sexual  activity: Not Currently  Other Topics Concern  . Not on file  Social History Narrative  . Not on file   Social Determinants of Health   Financial Resource Strain: Low Risk   . Difficulty of Paying Living Expenses: Not hard at all  Food Insecurity: No Food Insecurity  . Worried About Charity fundraiser in the Last Year: Never true  . Ran Out of Food in the Last Year: Never true  Transportation Needs: No Transportation Needs  . Lack of Transportation (Medical): No  . Lack of Transportation (Non-Medical): No  Physical Activity: Inactive  . Days of Exercise per Week: 0 days  . Minutes of Exercise per Session: 0 min  Stress: Stress Concern Present  . Feeling of Stress : Very much  Social Connections: Unknown  . Frequency of Communication with Friends and Family: Not on file  . Frequency of Social Gatherings with Friends and Family: Not on file  . Attends Religious Services: More than 4 times per year  . Active Member of Clubs or Organizations: No  . Attends Archivist Meetings: Never  . Marital Status: Married    Allergies:  Allergies  Allergen Reactions  . Ibuprofen Anaphylaxis  . Penicillins Anaphylaxis    Has patient had a PCN reaction causing immediate rash, facial/tongue/throat swelling, SOB or lightheadedness with hypotension: Yes Has patient had a PCN reaction causing severe rash involving mucus membranes or skin necrosis: Yes Has patient had a PCN reaction that required hospitalization Yes Has patient had a PCN reaction occurring within the last 10 years: Yes If all of the above answers are "NO", then may proceed with Cephalosporin use.   . Prednisone Palpitations  . Citalopram     dizziness   . Valproic Acid Other (See Comments)    drowsiness  . Aspirin Palpitations    Metabolic Disorder Labs: No results found for: HGBA1C, MPG No results found for: PROLACTIN Lab Results  Component Value Date   CHOL 178 11/18/2015   TRIG 167 (H) 11/18/2015   HDL  34 (L) 11/18/2015   CHOLHDL 5.2 11/18/2015   VLDL 33 11/18/2015   LDLCALC 111 (H) 11/18/2015   No results found for: TSH  Therapeutic Level Labs: No results found for: LITHIUM No results found for: VALPROATE No components found for:  CBMZ  Current Medications: Current Outpatient Medications  Medication Sig Dispense Refill  . rOPINIRole (REQUIP) 2 MG tablet TAKE ONE TABLET BY MOUTH ONCE NIGHTLY    . acetaminophen (TYLENOL) 500 MG tablet Take 1-2 tablets by mouth every 6 (six) hours as needed.    . carbidopa-levodopa (SINEMET IR) 25-250 MG tablet Take 1 tablet by mouth 3 (three) times daily.    . clonazePAM (KLONOPIN) 0.5 MG tablet Take 1 tablet by mouth 3 (three) times daily.  0  . clonazePAM (KLONOPIN) 0.5 MG tablet TAKE  ONE TABLET BY MOUTH THREE TIMES A DAY    . colchicine 0.6 MG tablet Take by mouth.    . colchicine 0.6 MG tablet Take by mouth.    . diazepam (VALIUM) 5 MG tablet Take 5 mg 30 minutes prior to MRI    . docusate sodium (COLACE) 100 MG capsule Take 100 mg by mouth 2 (two) times daily.    Marland Kitchen donepezil (ARICEPT) 5 MG tablet Take 5 mg nightly    . gabapentin (NEURONTIN) 600 MG tablet Take 600 mg by mouth at bedtime.    . hydrOXYzine (VISTARIL) 25 MG capsule Take 1 capsule (25 mg total) by mouth at bedtime as needed. For sleep 90 capsule 3  . ketoconazole (NIZORAL) 2 % shampoo APPLY TOPICALLY ONCE AND LEAVE ON FOR 5 MINUTES THEN RINSE. THEN USE TWICE WEEKLY FOR 4 WEEKS, THEN AS NEEDED    . mirtazapine (REMERON) 15 MG tablet Take 1 tablet (15 mg total) by mouth at bedtime. Anxiety and sleep 90 tablet 0  . Polyethylene Glycol 3350 (PEG 3350) 17 GM/SCOOP POWD Take by mouth.    . propranolol (INDERAL) 10 MG tablet Take 1 tablet (10 mg total) by mouth 3 (three) times daily as needed. For severe panic attacks 90 tablet 3  . rOPINIRole (REQUIP) 2 MG tablet Take by mouth.    . tamsulosin (FLOMAX) 0.4 MG CAPS capsule Take by mouth.    Marland Kitchen tiZANidine (ZANAFLEX) 2 MG tablet Take by  mouth.    . venlafaxine XR (EFFEXOR XR) 75 MG 24 hr capsule Take 1 capsule (75 mg total) by mouth daily with breakfast. 90 capsule 0  . vitamin B-12 (CYANOCOBALAMIN) 1000 MCG tablet Take by mouth.     No current facility-administered medications for this visit.     Musculoskeletal: Strength & Muscle Tone: UTA Gait & Station: UTA Patient leans: N/A  Psychiatric Specialty Exam: Review of Systems  Musculoskeletal: Positive for myalgias.  Psychiatric/Behavioral: Positive for sleep disturbance. The patient is nervous/anxious.   All other systems reviewed and are negative.   There were no vitals taken for this visit.There is no height or weight on file to calculate BMI.  General Appearance: UTA  Eye Contact:  Fair  Speech:  Normal Rate  Volume:  Normal  Mood:  Anxious  Affect:  UTA  Thought Process:  Goal Directed and Descriptions of Associations: Intact  Orientation:  Full (Time, Place, and Person)  Thought Content: Logical   Suicidal Thoughts:  No  Homicidal Thoughts:  No  Memory:  Immediate;   Fair Recent;   Fair Remote;   Fair  Judgement:  Fair  Insight:  Fair  Psychomotor Activity:  UTA  Concentration:  Concentration: Fair and Attention Span: Fair  Recall:  [employer] of Knowledge: Fair  Language: Fair  Akathisia:  No  Handed:  Right  AIMS (if indicated): UTA  Assets:  Communication Skills Desire for Improvement Housing Intimacy Social Support  ADL's:  Intact  Cognition: WNL  Sleep:  Poor   Screenings:   Assessment and Plan: Navier is a 64 year old Caucasian male, married, disabled, lives in [place], has a history of Parkinson's disease, anxiety, restless leg syndrome, BPH, chronic pain was evaluated by telemedicine today.  Patient is biologically predisposed given his history of chronic medical problems.  He also has current psychosocial stressors of limitations due to the pandemic as well as Parkinson's disease.  Patient continues to struggle with anxiety  symptoms and sleep issues, his sleep problems more so because  of his restless leg symptoms and the parkinsonian symptoms.  Plan as noted below.  Plan Panic attacks-unstable Increase venlafaxine extended release to 75 mg p.o. daily Remeron at reduced dose to 15 mg p.o. nightly Propranolol 10 mg p.o. 3 times daily as needed for anxiety attacks Continue Klonopin as prescribed by neurology  GAD-unstable Increase venlafaxine extended release to 75 mg p.o. daily Remeron 15 mg p.o. nightly.  Insomnia-unstable Remeron 15 mg p.o. nightly Requip 2 mg p.o. nightly for restless leg symptoms prescribed by neurology Melatonin over-the-counter 2 mg p.o. nightly Hydroxyzine 25 mg p.o. nightly as needed for sleep and anxiety. Patient reports he was restarted on gabapentin per neurology today.  We will monitor closely.  Collateral information was obtained from wife-[name] as summarized above.  Follow-up in clinic in 4 weeks or sooner if needed.  I have spent atleast 20 minutes non face to face with patient today. More than 50 % of the time was spent for preparing to see the patient ( e.g., review of test, records ), obtaining and to review and separately obtained history , ordering medications and test ,psychoeducation and supportive psychotherapy and care coordination,as well as documenting clinical information in electronic health record.This note was generated in part or whole with voice recognition software. Voice recognition is usually quite accurate but there are transcription errors that can and very often do occur. I apologize for any typographical errors that were not detected and corrected.       Ursula Alert, MD 10/26/2019, 5:11 PM

## 2019-11-22 ENCOUNTER — Ambulatory Visit: Payer: Medicare Other | Admitting: Licensed Clinical Social Worker

## 2019-11-24 ENCOUNTER — Encounter: Payer: Self-pay | Admitting: Psychiatry

## 2019-11-24 ENCOUNTER — Other Ambulatory Visit: Payer: Self-pay

## 2019-11-24 ENCOUNTER — Ambulatory Visit (INDEPENDENT_AMBULATORY_CARE_PROVIDER_SITE_OTHER): Payer: Medicare Other | Admitting: Psychiatry

## 2019-11-24 DIAGNOSIS — F410 Panic disorder [episodic paroxysmal anxiety] without agoraphobia: Secondary | ICD-10-CM

## 2019-11-24 DIAGNOSIS — G4701 Insomnia due to medical condition: Secondary | ICD-10-CM | POA: Diagnosis not present

## 2019-11-24 DIAGNOSIS — F411 Generalized anxiety disorder: Secondary | ICD-10-CM | POA: Diagnosis not present

## 2019-11-24 MED ORDER — VENLAFAXINE HCL ER 150 MG PO CP24: 150.0000 mg | ORAL_CAPSULE | Freq: Every day | ORAL | 0 refills | Status: DC

## 2019-11-24 NOTE — Progress Notes (Signed)
Provider Location : [facility] Patient Location : Home  Virtual Visit via Telephone Note  I connected with Gabriel Dillon on 11/24/19 at  3:00 PM EDT by telephone and verified that I am speaking with the correct person using two identifiers.   I discussed the limitations, risks, security and privacy concerns of performing an evaluation and management service by telephone and the availability of in person appointments. I also discussed with the patient that there may be a patient responsible charge related to this service. The patient expressed understanding and agreed to proceed.     I discussed the assessment and treatment plan with the patient. The patient was provided an opportunity to ask questions and all were answered. The patient agreed with the plan and demonstrated an understanding of the instructions.   The patient was advised to call back or seek an in-person evaluation if the symptoms worsen or if the condition fails to improve as anticipated.   [facility] MD OP Progress Note  11/24/2019 5:15 PM Gabriel Dillon  MRN:  [id number]  Chief Complaint:  Chief Complaint    Follow-up     HPI: Gabriel Dillon is a 64 year old Caucasian male, married, lives in [place], has a history of panic attacks, GAD, insomnia, restless leg syndrome, Parkinson's disease, chronic pain, BPH, insomnia was evaluated by phone today.  Patient preferred to do a phone call.  Patient today reports he was recently seen at the emergency department for abdominal pain.  He however reports all the work-up did not show anything and he currently believes it may have been anxiety.  He reports he continues to struggle with anxiety symptoms on a regular basis.  He reports he is compliant on medications and the venlafaxine helps to some extent.  He is also on Klonopin prescribed by neurology.  He reports sleep is improved.  Patient denies any suicidality, homicidality or perceptual disturbances.  Patient appeared to be alert and  oriented to person place time and situation.  Collateral information was obtained from [facility] who reports patient is worried about his physical limitations due to his multiple health issues including his Parkinson's disease.  She reports he is having gait instability and that worries him a lot.  This is contributing to his anxiety symptoms.     Visit Diagnosis:    ICD-10-CM   1. Panic disorder  F41.0 venlafaxine XR (EFFEXOR-XR) 150 MG 24 hr capsule  2. GAD (generalized anxiety disorder)  F41.1 venlafaxine XR (EFFEXOR-XR) 150 MG 24 hr capsule  3. Insomnia due to medical condition  G47.01     Past Psychiatric History: I have reviewed past psychiatric history from my progress note on 12/09/2018.  Past trials of Klonopin, Valium, Zoloft, Celexa, Lexapro, BuSpar  Past Medical History:  Past Medical History:  Diagnosis Date  . Allergic rhinitis   . Back pain   . Enlarged prostate   . Fatty liver   . Gout   . Nephrolithiasis   . Neuromuscular disorder ([place])   . Palpitation   . Panic attacks   . Panic attacks   . Parkinson's disease [facility])     Past Surgical History:  Procedure Laterality Date  . APPENDECTOMY    . COLONOSCOPY WITH PROPOFOL N/A 10/13/2018   Procedure: COLONOSCOPY WITH PROPOFOL;  Surgeon: Lollie Sails, MD;  Location: [facility] ENDOSCOPY;  Service: Endoscopy;  Laterality: N/A;  . TONSILLECTOMY      Family Psychiatric History: I have reviewed family psychiatric history from my progress note on 12/09/2018.  Family History:  Family History  Problem Relation Age of Onset  . Mental illness Neg Hx     Social History: I have reviewed social history from my progress note on 12/09/2018 Social History   Socioeconomic History  . Marital status: Married    Spouse name: teresa  . Number of children: 2  . Years of education: Not on file  . Highest education level: High school graduate  Occupational History  . Not on file  Tobacco Use  . Smoking status: Former  Smoker    Packs/day: 2.00    Years: 10.00    Pack years: 20.00    Quit date: 10/17/1992    Years since quitting: 27.1  . Smokeless tobacco: Never Used  Substance and Sexual Activity  . Alcohol use: No  . Drug use: No  . Sexual activity: Not Currently  Other Topics Concern  . Not on file  Social History Narrative  . Not on file   Social Determinants of Health   Financial Resource Strain: Low Risk   . Difficulty of Paying Living Expenses: Not hard at all  Food Insecurity: No Food Insecurity  . Worried About Charity fundraiser in the Last Year: Never true  . Ran Out of Food in the Last Year: Never true  Transportation Needs: No Transportation Needs  . Lack of Transportation (Medical): No  . Lack of Transportation (Non-Medical): No  Physical Activity: Inactive  . Days of Exercise per Week: 0 days  . Minutes of Exercise per Session: 0 min  Stress: Stress Concern Present  . Feeling of Stress : Very much  Social Connections: Unknown  . Frequency of Communication with Friends and Family: Not on file  . Frequency of Social Gatherings with Friends and Family: Not on file  . Attends Religious Services: More than 4 times per year  . Active Member of Clubs or Organizations: No  . Attends Archivist Meetings: Never  . Marital Status: Married    Allergies:  Allergies  Allergen Reactions  . Ibuprofen Anaphylaxis  . Penicillins Anaphylaxis    Has patient had a PCN reaction causing immediate rash, facial/tongue/throat swelling, SOB or lightheadedness with hypotension: Yes Has patient had a PCN reaction causing severe rash involving mucus membranes or skin necrosis: Yes Has patient had a PCN reaction that required hospitalization Yes Has patient had a PCN reaction occurring within the last 10 years: Yes If all of the above answers are "NO", then may proceed with Cephalosporin use.   . Prednisone Palpitations  . Citalopram     dizziness   . Valproic Acid Other (See  Comments)    drowsiness  . Aspirin Palpitations    Metabolic Disorder Labs: No results found for: HGBA1C, MPG No results found for: PROLACTIN Lab Results  Component Value Date   CHOL 178 11/18/2015   TRIG 167 (H) 11/18/2015   HDL 34 (L) 11/18/2015   CHOLHDL 5.2 11/18/2015   VLDL 33 11/18/2015   LDLCALC 111 (H) 11/18/2015   No results found for: TSH  Therapeutic Level Labs: No results found for: LITHIUM No results found for: VALPROATE No components found for:  CBMZ  Current Medications: Current Outpatient Medications  Medication Sig Dispense Refill  . clonazePAM (KLONOPIN) 1 MG tablet Take 1 mg three times a day    . acetaminophen (TYLENOL) 500 MG tablet Take 1-2 tablets by mouth every 6 (six) hours as needed.    . carbidopa-levodopa (SINEMET IR) 25-250 MG tablet Take 1 tablet by mouth 3 (  three) times daily.    . colchicine 0.6 MG tablet Take by mouth.    . colchicine 0.6 MG tablet Take by mouth.    . diazepam (VALIUM) 5 MG tablet Take 5 mg 30 minutes prior to MRI    . docusate sodium (COLACE) 100 MG capsule Take 100 mg by mouth 2 (two) times daily.    Marland Kitchen donepezil (ARICEPT) 5 MG tablet Take 5 mg nightly    . gabapentin (NEURONTIN) 600 MG tablet Take 600 mg by mouth at bedtime.    . hydrOXYzine (VISTARIL) 25 MG capsule Take 1 capsule (25 mg total) by mouth at bedtime as needed. For sleep 90 capsule 3  . ketoconazole (NIZORAL) 2 % shampoo APPLY TOPICALLY ONCE AND LEAVE ON FOR 5 MINUTES THEN RINSE. THEN USE TWICE WEEKLY FOR 4 WEEKS, THEN AS NEEDED    . mirtazapine (REMERON) 15 MG tablet Take 1 tablet (15 mg total) by mouth at bedtime. Anxiety and sleep 90 tablet 0  . Polyethylene Glycol 3350 (PEG 3350) 17 GM/SCOOP POWD Take by mouth.    . propranolol (INDERAL) 10 MG tablet Take 1 tablet (10 mg total) by mouth 3 (three) times daily as needed. For severe panic attacks 90 tablet 3  . rOPINIRole (REQUIP) 2 MG tablet Take by mouth.    Marland Kitchen rOPINIRole (REQUIP) 2 MG tablet TAKE ONE TABLET  BY MOUTH ONCE NIGHTLY    . tamsulosin (FLOMAX) 0.4 MG CAPS capsule Take by mouth.    Marland Kitchen tiZANidine (ZANAFLEX) 2 MG tablet Take by mouth.    . venlafaxine XR (EFFEXOR-XR) 150 MG 24 hr capsule Take 1 capsule (150 mg total) by mouth daily with breakfast. 90 capsule 0  . vitamin B-12 (CYANOCOBALAMIN) 1000 MCG tablet Take by mouth.     No current facility-administered medications for this visit.     Musculoskeletal: Strength & Muscle Tone: UTA Gait & Station: normal Patient leans: N/A  Psychiatric Specialty Exam: Review of Systems  Neurological: Positive for tremors.  Psychiatric/Behavioral: The patient is nervous/anxious.   All other systems reviewed and are negative.   There were no vitals taken for this visit.There is no height or weight on file to calculate BMI.  General Appearance: UTA  Eye Contact:  UTA  Speech:  Clear and Coherent  Volume:  Normal  Mood:  Anxious  Affect:  UTA  Thought Process:  Goal Directed and Descriptions of Associations: Intact  Orientation:  Full (Time, Place, and Person)  Thought Content: Logical   Suicidal Thoughts:  No  Homicidal Thoughts:  No  Memory:  Immediate;   Fair Recent;   Fair Remote;   Fair  Judgement:  Fair  Insight:  Fair  Psychomotor Activity:  UTA  Concentration:  Concentration: Fair and Attention Span: Fair  Recall:  [employer] of Knowledge: Fair  Language: Fair  Akathisia:  No  Handed:  Right  AIMS (if indicated): UTA  Assets:  Communication Skills Desire for Improvement Housing Social Support  ADL's:  Intact  Cognition: WNL  Sleep:  Fair   Screenings:   Assessment and Plan: Gabriel Dillon is a 64 year old Caucasian male, married, disabled, lives in [place], has a history of Parkinson's disease, anxiety, restless leg syndrome, BPH, chronic pain was evaluated by telemedicine today.  Patient is biologically predisposed given his history of chronic medical problems.  He also has psychosocial stressors of limitations due to  pandemic as well as Parkinson's disease.  Patient continues to struggle with anxiety symptoms and will benefit from the  following medication readjustment.  Plan as noted below.  Plan Panic attacks-unstable Increase venlafaxine extended release to 150 mg p.o. daily Remeron at reduced dose to 15 mg p.o. nightly Propranolol 10 mg p.o. 3 times daily as needed for anxiety attacks Continue Klonopin prescribed by neurology   GAD-unstable Increase venlafaxine as discussed above. Remeron 15 mg p.o. nightly  Insomnia-improving Remeron 15 mg p.o. nightly Requip 2 mg p.o. nightly for restless leg symptoms prescribed by neurology Melatonin over-the-counter-2 mg p.o. nightly Hydroxyzine 25 mg p.o. nightly as needed for sleep and anxiety Patient also was recently restarted on gabapentin per neurology.  Collateral information was obtained from wife-[name] as summarized above.  Discussed with patient as well as wife about starting psychotherapy sessions due to his multiple health issues contributing to his anxiety symptoms.  Provided information for therapist in the community.  Follow-up in clinic in 4 weeks or sooner if needed.  I have mailed his AVS to his mailing address.  I have spent atleast 20 minutes non face to face with patient today. More than 50 % of the time was spent for preparing to see the patient ( e.g., review of test, records ), ordering medications and test ,psychoeducation and supportive psychotherapy and care coordination,as well as documenting clinical information in electronic health record. This note was generated in part or whole with voice recognition software. Voice recognition is usually quite accurate but there are transcription errors that can and very often do occur. I apologize for any typographical errors that were not detected and corrected.      Ursula Alert, MD 11/24/2019, 5:15 PM

## 2019-11-24 NOTE — Patient Instructions (Signed)
Increase Effexor XR to 150 mg daily.  Please contact therapist -Mr. Buena Irish at [phone] Ms. Terie Purser at [phone] Ms. Marjie Skiff at [phone]  Follow-up in clinic on May 6 at 11:30 AM-by phone call.

## 2019-11-30 ENCOUNTER — Other Ambulatory Visit
Admission: RE | Admit: 2019-11-30 | Discharge: 2019-11-30 | Disposition: A | Discharge status: Home / Self Care | Payer: Medicare Other | Source: Ambulatory Visit | Attending: Otolaryngology | Admitting: Otolaryngology

## 2019-11-30 DIAGNOSIS — R0602 Shortness of breath: Secondary | ICD-10-CM | POA: Insufficient documentation

## 2019-11-30 DIAGNOSIS — Z20822 Contact with and (suspected) exposure to covid-19: Secondary | ICD-10-CM | POA: Insufficient documentation

## 2019-11-30 DIAGNOSIS — Z01812 Encounter for preprocedural laboratory examination: Secondary | ICD-10-CM | POA: Diagnosis present

## 2019-11-30 LAB — SARS CORONAVIRUS 2 (TAT 6-24 HRS): SARS Coronavirus 2: NEGATIVE

## 2019-12-02 ENCOUNTER — Ambulatory Visit: Payer: Medicare Other | Attending: Otolaryngology

## 2019-12-02 DIAGNOSIS — G20 Parkinson's disease: Secondary | ICD-10-CM | POA: Insufficient documentation

## 2019-12-02 DIAGNOSIS — E669 Obesity, unspecified: Secondary | ICD-10-CM | POA: Insufficient documentation

## 2019-12-02 DIAGNOSIS — Z6841 Body Mass Index (BMI) 40.0 and over, adult: Secondary | ICD-10-CM | POA: Diagnosis not present

## 2019-12-02 DIAGNOSIS — G4733 Obstructive sleep apnea (adult) (pediatric): Secondary | ICD-10-CM | POA: Diagnosis present

## 2019-12-03 ENCOUNTER — Other Ambulatory Visit: Payer: Self-pay

## 2019-12-23 ENCOUNTER — Telehealth (INDEPENDENT_AMBULATORY_CARE_PROVIDER_SITE_OTHER): Payer: Medicare Other | Admitting: Psychiatry

## 2019-12-23 ENCOUNTER — Encounter: Payer: Self-pay | Admitting: Psychiatry

## 2019-12-23 ENCOUNTER — Other Ambulatory Visit: Payer: Self-pay

## 2019-12-23 DIAGNOSIS — G4701 Insomnia due to medical condition: Secondary | ICD-10-CM | POA: Diagnosis not present

## 2019-12-23 DIAGNOSIS — F410 Panic disorder [episodic paroxysmal anxiety] without agoraphobia: Secondary | ICD-10-CM | POA: Diagnosis not present

## 2019-12-23 DIAGNOSIS — F411 Generalized anxiety disorder: Secondary | ICD-10-CM

## 2019-12-23 NOTE — Progress Notes (Signed)
Provider Location : [facility] Patient Location : Home  Virtual Visit via Telephone Note  I connected with Gabriel Dillon on 12/23/19 at 11:30 AM EDT by telephone and verified that I am speaking with the correct person using two identifiers.   I discussed the limitations, risks, security and privacy concerns of performing an evaluation and management service by telephone and the availability of in person appointments. I also discussed with the patient that there may be a patient responsible charge related to this service. The patient expressed understanding and agreed to proceed.      I discussed the assessment and treatment plan with the patient. The patient was provided an opportunity to ask questions and all were answered. The patient agreed with the plan and demonstrated an understanding of the instructions.   The patient was advised to call back or seek an in-person evaluation if the symptoms worsen or if the condition fails to improve as anticipated.  [place] MD OP Progress Note  12/23/2019 5:50 PM Gabriel Dillon  MRN:  [id number]  Chief Complaint:  Chief Complaint    Follow-up     HPI: Gabriel Dillon is a 64 year old Caucasian male, married, lives in [place], has a history of panic attacks, GAD, insomnia, restless leg syndrome, Parkinson's disease, chronic pain, BPH, insomnia was evaluated by phone today.  Patient today reports he is currently doing well on the current medication regimen.  He reports he does struggle with fatigue and tiredness during the day.  However he is aware that it is likely due to being on multiple medication as well as his medical problems.  Patient reports he is compliant on his medications like venlafaxine.  He denies side effects other than the fatigue.  Patient denies any suicidality, homicidality or perceptual disturbances.  Patient denies any other concerns today. Visit Diagnosis:    ICD-10-CM   1. Panic disorder  F41.0   2. GAD (generalized anxiety  disorder)  F41.1   3. Insomnia due to medical condition  G47.01     Past Psychiatric History: I have reviewed past psychiatric history from my progress note on 12/09/2018.  Past trials of Klonopin, Valium, Zoloft, Celexa, Lexapro, BuSpar.  Past Medical History:  Past Medical History:  Diagnosis Date  . Allergic rhinitis   . Back pain   . Enlarged prostate   . Fatty liver   . Gout   . Nephrolithiasis   . Neuromuscular disorder ([employer])   . Palpitation   . Panic attacks   . Panic attacks   . Parkinson's disease [facility])     Past Surgical History:  Procedure Laterality Date  . APPENDECTOMY    . COLONOSCOPY WITH PROPOFOL N/A 10/13/2018   Procedure: COLONOSCOPY WITH PROPOFOL;  Surgeon: Lollie Sails, MD;  Location: [facility] ENDOSCOPY;  Service: Endoscopy;  Laterality: N/A;  . TONSILLECTOMY      Family Psychiatric History: I have reviewed family psychiatric history from my progress note on 12/09/2018.  Family History:  Family History  Problem Relation Age of Onset  . Mental illness Neg Hx     Social History: I have reviewed social history from my progress note on 12/09/2018. Social History   Socioeconomic History  . Marital status: Married    Spouse name: Gabriel Dillon  . Number of children: 2  . Years of education: Not on file  . Highest education level: High school graduate  Occupational History  . Not on file  Tobacco Use  . Smoking status: Former Smoker    Packs/day: 2.00  Years: 10.00    Pack years: 20.00    Quit date: 10/17/1992    Years since quitting: 27.2  . Smokeless tobacco: Never Used  Substance and Sexual Activity  . Alcohol use: No  . Drug use: No  . Sexual activity: Not Currently  Other Topics Concern  . Not on file  Social History Narrative  . Not on file   Social Determinants of Health   Financial Resource Strain:   . Difficulty of Paying Living Expenses:   Food Insecurity:   . Worried About Charity fundraiser in the Last Year:   . Arboriculturist in  the Last Year:   Transportation Needs:   . Film/video editor (Medical):   Marland Kitchen Lack of Transportation (Non-Medical):   Physical Activity:   . Days of Exercise per Week:   . Minutes of Exercise per Session:   Stress:   . Feeling of Stress :   Social Connections:   . Frequency of Communication with Friends and Family:   . Frequency of Social Gatherings with Friends and Family:   . Attends Religious Services:   . Active Member of Clubs or Organizations:   . Attends Archivist Meetings:   Marland Kitchen Marital Status:     Allergies:  Allergies  Allergen Reactions  . Ibuprofen Anaphylaxis  . Penicillins Anaphylaxis    Has patient had a PCN reaction causing immediate rash, facial/tongue/throat swelling, SOB or lightheadedness with hypotension: Yes Has patient had a PCN reaction causing severe rash involving mucus membranes or skin necrosis: Yes Has patient had a PCN reaction that required hospitalization Yes Has patient had a PCN reaction occurring within the last 10 years: Yes If all of the above answers are "NO", then may proceed with Cephalosporin use.   . Prednisone Palpitations  . Citalopram     dizziness   . Valproic Acid Other (See Comments)    drowsiness  . Aspirin Palpitations    Metabolic Disorder Labs: No results found for: HGBA1C, MPG No results found for: PROLACTIN Lab Results  Component Value Date   CHOL 178 11/18/2015   TRIG 167 (H) 11/18/2015   HDL 34 (L) 11/18/2015   CHOLHDL 5.2 11/18/2015   VLDL 33 11/18/2015   LDLCALC 111 (H) 11/18/2015   No results found for: TSH  Therapeutic Level Labs: No results found for: LITHIUM No results found for: VALPROATE No components found for:  CBMZ  Current Medications: Current Outpatient Medications  Medication Sig Dispense Refill  . furosemide (LASIX) 20 MG tablet Take by mouth.    Marland Kitchen acetaminophen (TYLENOL) 500 MG tablet Take 1-2 tablets by mouth every 6 (six) hours as needed.    . carbidopa-levodopa (SINEMET  IR) 25-250 MG tablet Take 1 tablet by mouth 3 (three) times daily.    . clonazePAM (KLONOPIN) 1 MG tablet Take 1 mg three times a day    . colchicine 0.6 MG tablet Take by mouth.    . colchicine 0.6 MG tablet Take by mouth.    . diazepam (VALIUM) 5 MG tablet Take 5 mg 30 minutes prior to MRI    . docusate sodium (COLACE) 100 MG capsule Take 100 mg by mouth 2 (two) times daily.    Marland Kitchen donepezil (ARICEPT) 5 MG tablet Take 5 mg nightly    . gabapentin (NEURONTIN) 600 MG tablet Take 600 mg by mouth at bedtime.    . hydrOXYzine (VISTARIL) 25 MG capsule Take 1 capsule (25 mg total) by mouth at  bedtime as needed. For sleep 90 capsule 3  . ketoconazole (NIZORAL) 2 % shampoo APPLY TOPICALLY ONCE AND LEAVE ON FOR 5 MINUTES THEN RINSE. THEN USE TWICE WEEKLY FOR 4 WEEKS, THEN AS NEEDED    . mirtazapine (REMERON) 15 MG tablet Take 1 tablet (15 mg total) by mouth at bedtime. Anxiety and sleep 90 tablet 0  . Polyethylene Glycol 3350 (PEG 3350) 17 GM/SCOOP POWD Take by mouth.    . propranolol (INDERAL) 10 MG tablet Take 1 tablet (10 mg total) by mouth 3 (three) times daily as needed. For severe panic attacks 90 tablet 3  . rOPINIRole (REQUIP) 2 MG tablet Take by mouth.    Marland Kitchen rOPINIRole (REQUIP) 2 MG tablet TAKE ONE TABLET BY MOUTH ONCE NIGHTLY    . tamsulosin (FLOMAX) 0.4 MG CAPS capsule Take by mouth.    Marland Kitchen tiZANidine (ZANAFLEX) 2 MG tablet Take by mouth.    . venlafaxine XR (EFFEXOR-XR) 150 MG 24 hr capsule Take 1 capsule (150 mg total) by mouth daily with breakfast. 90 capsule 0  . vitamin B-12 (CYANOCOBALAMIN) 1000 MCG tablet Take by mouth.     No current facility-administered medications for this visit.     Musculoskeletal: Strength & Muscle Tone: UTA Gait & Station: UTA Patient leans: N/A  Psychiatric Specialty Exam: Review of Systems  Constitutional: Positive for fatigue.  Psychiatric/Behavioral: Positive for sleep disturbance. The patient is nervous/anxious.   All other systems reviewed and are  negative.   There were no vitals taken for this visit.There is no height or weight on file to calculate BMI.  General Appearance: UTA  Eye Contact:  UTA  Speech:  Clear and Coherent  Volume:  Normal  Mood:  Anxious  Affect:  UTA  Thought Process:  Goal Directed and Descriptions of Associations: Intact  Orientation:  Full (Time, Place, and Person)  Thought Content: Logical   Suicidal Thoughts:  No  Homicidal Thoughts:  No  Memory:  Immediate;   Fair Recent;   Fair Remote;   Fair  Judgement:  Fair  Insight:  Fair  Psychomotor Activity:  UTA  Concentration:  Concentration: Fair and Attention Span: Fair  Recall:  [employer] of Knowledge: Fair  Language: Fair  Akathisia:  No  Handed:  Right  AIMS (if indicated): UTA  Assets:  Communication Skills Desire for Improvement Housing Social Support  ADL's:  Intact  Cognition: WNL  Sleep:  Improving   Screenings:   Assessment and Plan: Advay is a 64 year old Caucasian male, married, disabled, lives in [place], has a history of Parkinson's disease, anxiety, restless leg syndrome, BPH, chronic pain was evaluated by phone today.  Patient is biologically predisposed given his history of chronic medical problems.  He also has psychosocial stressors of physical limitations, limitations due to the pandemic.  Patient does have fatigue however he does have multiple health issues including Parkinson's disease as well as is on polypharmacy.  Patient however reports anxiety symptoms as improving.  Plan as noted below.  Plan Panic attacks-improving Venlafaxine extended release 150 mg p.o. daily Remeron at reduced dose to 15 mg p.o. nightly Propranolol 10 mg p.o. 3 times daily as needed for anxiety attacks Continue Klonopin as prescribed per neurology.  GAD-improving Venlafaxine as prescribed Mirtazapine 15 mg p.o. nightly  Insomnia-improving Mirtazapine 15 mg p.o. nightly Requip 2 mg p.o. nightly for restless leg symptoms prescribed  by neurology Melatonin over-the-counter 2 mg p.o. nightly Hydroxyzine 25 mg p.o. nightly as needed for sleep and anxiety Continue  gabapentin as prescribed by neurology  Follow-up in clinic in 2 to 3 months or sooner if needed.  I have spent atleast 20 minutes non face to face with patient today. More than 50 % of the time was spent for preparing to see the patient ( e.g., review of test, records ), ordering medications and test ,psychoeducation and supportive psychotherapy and care coordination,as well as documenting clinical information in electronic health record. This note was generated in part or whole with voice recognition software. Voice recognition is usually quite accurate but there are transcription errors that can and very often do occur. I apologize for any typographical errors that were not detected and corrected.       Ursula Alert, MD 12/23/2019, 5:50 PM

## 2020-02-24 ENCOUNTER — Encounter: Payer: Self-pay | Admitting: Psychiatry

## 2020-02-24 ENCOUNTER — Telehealth: Payer: Self-pay

## 2020-02-24 ENCOUNTER — Other Ambulatory Visit: Payer: Self-pay

## 2020-02-24 ENCOUNTER — Telehealth (INDEPENDENT_AMBULATORY_CARE_PROVIDER_SITE_OTHER): Payer: Medicare Other | Admitting: Psychiatry

## 2020-02-24 DIAGNOSIS — G4701 Insomnia due to medical condition: Secondary | ICD-10-CM

## 2020-02-24 DIAGNOSIS — F410 Panic disorder [episodic paroxysmal anxiety] without agoraphobia: Secondary | ICD-10-CM | POA: Diagnosis not present

## 2020-02-24 DIAGNOSIS — F411 Generalized anxiety disorder: Secondary | ICD-10-CM

## 2020-02-24 MED ORDER — VENLAFAXINE HCL ER 150 MG PO CP24: 150.0000 mg | ORAL_CAPSULE | Freq: Every day | ORAL | 0 refills | Status: DC

## 2020-02-24 MED ORDER — VENLAFAXINE HCL ER 37.5 MG PO CP24: 37.5000 mg | ORAL_CAPSULE | Freq: Every day | ORAL | 0 refills | Status: DC

## 2020-02-24 NOTE — Telephone Encounter (Signed)
avs mailed out

## 2020-02-24 NOTE — Progress Notes (Signed)
Provider Location : [facility] Patient Location : Home  Virtual Visit via Video Note  I connected with Gabriel Dillon on 02/24/20 at 10:30 AM EDT by a video enabled telemedicine application and verified that I am speaking with the correct person using two identifiers.   I discussed the limitations of evaluation and management by telemedicine and the availability of in person appointments. The patient expressed understanding and agreed to proceed.    I discussed the assessment and treatment plan with the patient. The patient was provided an opportunity to ask questions and all were answered. The patient agreed with the plan and demonstrated an understanding of the instructions.   The patient was advised to call back or seek an in-person evaluation if the symptoms worsen or if the condition fails to improve as anticipated.   [place] MD OP Progress Note  02/24/2020 12:21 PM Gabriel Dillon  MRN:  [id number]  Chief Complaint:  Chief Complaint    Follow-up     HPI: Gabriel Dillon is a 64 year old Caucasian male, married, lives in [place] has a history of panic attacks, GAD, insomnia, restless leg syndrome, Parkinson's disease, chronic pain, BPH, insomnia was evaluated by telemedicine today.  A video call was initiated however due to connection problem it had to be changed to a phone call.  Patient today reports he is currently struggling with back pain radiating down his legs.  He has upcoming appointment with a spine clinic.  He continues to follow-up with neurology.  Patient reports as per neurology he may not have Parkinson's disease and his problems could be related to his back.  He however reports once he went down on the dosage of his antiparkinsonian medication his ability to walk was affected.  Patient reports sleep is affected due to his pain.  Patient continues to be compliant on his medications.  He reports he continues to have anxiety related to his multiple medical issues and pain.  He is  interested in dosage increase of venlafaxine.  He denies side effects to any medications.  Collateral information was obtained from wife-[name] who reports patient is struggling with pain.  She is also agreeable to increasing the dosage of venlafaxine.  Patient denies any other concerns today.  Visit Diagnosis:    ICD-10-CM   1. Panic disorder  F41.0 venlafaxine XR (EFFEXOR-XR) 150 MG 24 hr capsule    venlafaxine XR (EFFEXOR-XR) 37.5 MG 24 hr capsule  2. GAD (generalized anxiety disorder)  F41.1 venlafaxine XR (EFFEXOR-XR) 150 MG 24 hr capsule    venlafaxine XR (EFFEXOR-XR) 37.5 MG 24 hr capsule  3. Insomnia due to medical condition  G47.01     Past Psychiatric History: I have reviewed past trials of Klonopin, Valium, Zoloft, Celexa, Lexapro, BuSpar  Past Medical History:  Past Medical History:  Diagnosis Date  . Allergic rhinitis   . Back pain   . Enlarged prostate   . Fatty liver   . Gout   . Nephrolithiasis   . Neuromuscular disorder ([place])   . Palpitation   . Panic attacks   . Panic attacks   . Parkinson's disease [facility])     Past Surgical History:  Procedure Laterality Date  . APPENDECTOMY    . COLONOSCOPY WITH PROPOFOL N/A 10/13/2018   Procedure: COLONOSCOPY WITH PROPOFOL;  Surgeon: Lollie Sails, MD;  Location: [facility] ENDOSCOPY;  Service: Endoscopy;  Laterality: N/A;  . TONSILLECTOMY      Family Psychiatric History: I have reviewed family psychiatric history from my progress note  on 12/09/2018  Family History:  Family History  Problem Relation Age of Onset  . Mental illness Neg Hx     Social History: Reviewed social history from my progress note on 12/09/2018 Social History   Socioeconomic History  . Marital status: Married    Spouse name: teresa  . Number of children: 2  . Years of education: Not on file  . Highest education level: High school graduate  Occupational History  . Not on file  Tobacco Use  . Smoking status: Former Smoker    Packs/day:  2.00    Years: 10.00    Pack years: 20.00    Quit date: 10/17/1992    Years since quitting: 27.3  . Smokeless tobacco: Never Used  Vaping Use  . Vaping Use: Never used  Substance and Sexual Activity  . Alcohol use: No  . Drug use: No  . Sexual activity: Not Currently  Other Topics Concern  . Not on file  Social History Narrative  . Not on file   Social Determinants of Health   Financial Resource Strain:   . Difficulty of Paying Living Expenses:   Food Insecurity:   . Worried About Charity fundraiser in the Last Year:   . Arboriculturist in the Last Year:   Transportation Needs:   . Film/video editor (Medical):   Gabriel Kitchen Lack of Transportation (Non-Medical):   Physical Activity:   . Days of Exercise per Week:   . Minutes of Exercise per Session:   Stress:   . Feeling of Stress :   Social Connections:   . Frequency of Communication with Friends and Family:   . Frequency of Social Gatherings with Friends and Family:   . Attends Religious Services:   . Active Member of Clubs or Organizations:   . Attends Archivist Meetings:   Gabriel Kitchen Marital Status:     Allergies:  Allergies  Allergen Reactions  . Ibuprofen Anaphylaxis  . Penicillins Anaphylaxis    Has patient had a PCN reaction causing immediate rash, facial/tongue/throat swelling, SOB or lightheadedness with hypotension: Yes Has patient had a PCN reaction causing severe rash involving mucus membranes or skin necrosis: Yes Has patient had a PCN reaction that required hospitalization Yes Has patient had a PCN reaction occurring within the last 10 years: Yes If all of the above answers are "NO", then may proceed with Cephalosporin use.   . Prednisone Palpitations  . Citalopram     dizziness   . Valproic Acid Other (See Comments)    drowsiness  . Aspirin Palpitations    Metabolic Disorder Labs: No results found for: HGBA1C, MPG No results found for: PROLACTIN Lab Results  Component Value Date   CHOL 178  11/18/2015   TRIG 167 (H) 11/18/2015   HDL 34 (L) 11/18/2015   CHOLHDL 5.2 11/18/2015   VLDL 33 11/18/2015   LDLCALC 111 (H) 11/18/2015   No results found for: TSH  Therapeutic Level Labs: No results found for: LITHIUM No results found for: VALPROATE No components found for:  CBMZ  Current Medications: Current Outpatient Medications  Medication Sig Dispense Refill  . clonazePAM (KLONOPIN) 1 MG tablet Take 1 tablet by mouth 3 (three) times daily.    Gabriel Kitchen acetaminophen (TYLENOL) 500 MG tablet Take 1-2 tablets by mouth every 6 (six) hours as needed.    . carbidopa-levodopa (SINEMET IR) 25-250 MG tablet Take 1 tablet by mouth 3 (three) times daily.    . clonazePAM (KLONOPIN) 1  MG tablet Take 1 mg three times a day    . colchicine 0.6 MG tablet Take by mouth.    . colchicine 0.6 MG tablet Take by mouth.    . diazepam (VALIUM) 5 MG tablet Take 5 mg 30 minutes prior to MRI    . docusate sodium (COLACE) 100 MG capsule Take 100 mg by mouth 2 (two) times daily.    Gabriel Kitchen donepezil (ARICEPT) 5 MG tablet Take 5 mg nightly    . furosemide (LASIX) 20 MG tablet Take by mouth.    . gabapentin (NEURONTIN) 600 MG tablet Take 600 mg by mouth at bedtime.    . hydrOXYzine (VISTARIL) 25 MG capsule Take 1 capsule (25 mg total) by mouth at bedtime as needed. For sleep 90 capsule 3  . ketoconazole (NIZORAL) 2 % shampoo APPLY TOPICALLY ONCE AND LEAVE ON FOR 5 MINUTES THEN RINSE. THEN USE TWICE WEEKLY FOR 4 WEEKS, THEN AS NEEDED    . mirtazapine (REMERON) 15 MG tablet Take 1 tablet (15 mg total) by mouth at bedtime. Anxiety and sleep 90 tablet 0  . Polyethylene Glycol 3350 (PEG 3350) 17 GM/SCOOP POWD Take by mouth.    . propranolol (INDERAL) 10 MG tablet Take 1 tablet (10 mg total) by mouth 3 (three) times daily as needed. For severe panic attacks 90 tablet 3  . rOPINIRole (REQUIP) 2 MG tablet Take by mouth.    Gabriel Kitchen rOPINIRole (REQUIP) 2 MG tablet TAKE ONE TABLET BY MOUTH ONCE NIGHTLY    . tamsulosin (FLOMAX) 0.4 MG  CAPS capsule Take by mouth.    Gabriel Kitchen tiZANidine (ZANAFLEX) 2 MG tablet Take by mouth.    . venlafaxine XR (EFFEXOR-XR) 150 MG 24 hr capsule Take 1 capsule (150 mg total) by mouth daily with breakfast. To be combined with 37.5 mg daily 90 capsule 0  . venlafaxine XR (EFFEXOR-XR) 37.5 MG 24 hr capsule Take 1 capsule (37.5 mg total) by mouth daily with breakfast. 90 capsule 0  . vitamin B-12 (CYANOCOBALAMIN) 1000 MCG tablet Take by mouth.     No current facility-administered medications for this visit.     Musculoskeletal: Strength & Muscle Tone: UTA Gait & Station: UTA Patient leans: N/A  Psychiatric Specialty Exam: Review of Systems  Musculoskeletal: Positive for back pain.  Psychiatric/Behavioral: The patient is nervous/anxious.   All other systems reviewed and are negative.   There were no vitals taken for this visit.There is no height or weight on file to calculate BMI.  General Appearance: UTA  Eye Contact:  UTA  Speech:  Clear and Coherent  Volume:  Normal  Mood:  Anxious  Affect:  UTA  Thought Process:  Goal Directed and Descriptions of Associations: Intact  Orientation:  Full (Time, Place, and Person)  Thought Content: Logical   Suicidal Thoughts:  No  Homicidal Thoughts:  No  Memory:  Immediate;   Fair Recent;   Fair Remote;   Fair  Judgement:  Fair  Insight:  Fair  Psychomotor Activity:  UTA  Concentration:  Concentration: Fair and Attention Span: Fair  Recall:  [employer] of Knowledge: Fair  Language: Fair  Akathisia:  No  Handed:  Right  AIMS (if indicated):UTA  Assets:  Communication Skills Desire for Improvement Housing Social Support  ADL's:  Intact  Cognition: WNL  Sleep:  restless due to pain   Screenings:   Assessment and Plan: Susan Arana is a 64 year old Caucasian male, married, disabled, lives in [place], has a history of Parkinson's disease,  anxiety, panic attacks, restless leg syndrome, BPH, chronic pain was evaluated by telemedicine  today.  Patient with biological predisposition given his multiple medical problems including pain, Parkinson's disease.  He also has psychosocial stressors of physical limitations due to his medical problems.  Patient continues to struggle with pain which does have an impact on his anxiety and sleep.  Plan as noted below.  Plan Panic attacks-improving Venlafaxine extended release as prescribed Remeron at reduced dose to 15 mg p.o. nightly Propranolol 10 mg p.o. 3 times daily as needed for anxiety attacks Continue Klonopin as prescribed by neurology   GAD-unstable Increase venlafaxine to 187.5 mg p.o. daily Mirtazapine 15 mg p.o. nightly  Insomnia-restless due to pain Mirtazapine 15 mg p.o. nightly Requip 2 mg p.o. nightly for restless leg symptoms and neurology Hydroxyzine 25 mg p.o. nightly as needed for sleep and anxiety Melatonin over-the-counter as needed Continue gabapentin as prescribed by neurology  Collateral information was obtained from wife Clarene Critchley as summarized above  Follow-up in clinic in 6 to 8 weeks or sooner if needed.  I have spent atleast 20 minutes non face to face with patient today. More than 50 % of the time was spent for preparing to see the patient ( e.g., review of test, records ), obtaining and to review and separately obtained history , ordering medications and test ,psychoeducation and supportive psychotherapy and care coordination,as well as documenting clinical information in electronic health record. This note was generated in part or whole with voice recognition software. Voice recognition is usually quite accurate but there are transcription errors that can and very often do occur. I apologize for any typographical errors that were not detected and corrected.       Ursula Alert, MD 02/24/2020, 12:21 PM

## 2020-02-28 ENCOUNTER — Other Ambulatory Visit: Payer: Self-pay | Admitting: Physical Medicine & Rehabilitation

## 2020-02-28 DIAGNOSIS — M5416 Radiculopathy, lumbar region: Secondary | ICD-10-CM

## 2020-03-11 ENCOUNTER — Other Ambulatory Visit: Payer: Self-pay

## 2020-03-11 ENCOUNTER — Ambulatory Visit
Admission: RE | Admit: 2020-03-11 | Discharge: 2020-03-11 | Disposition: A | Discharge status: Home / Self Care | Payer: Medicare Other | Source: Ambulatory Visit | Attending: Physical Medicine & Rehabilitation | Admitting: Physical Medicine & Rehabilitation

## 2020-03-11 DIAGNOSIS — M5416 Radiculopathy, lumbar region: Secondary | ICD-10-CM | POA: Diagnosis not present

## 2020-03-13 DIAGNOSIS — M48061 Spinal stenosis, lumbar region without neurogenic claudication: Secondary | ICD-10-CM | POA: Insufficient documentation

## 2020-03-21 DIAGNOSIS — Z79899 Other long term (current) drug therapy: Secondary | ICD-10-CM | POA: Insufficient documentation

## 2020-04-03 ENCOUNTER — Telehealth: Payer: Self-pay

## 2020-04-03 DIAGNOSIS — F410 Panic disorder [episodic paroxysmal anxiety] without agoraphobia: Secondary | ICD-10-CM

## 2020-04-03 NOTE — Telephone Encounter (Signed)
pt called left message that he needed refills o klonopin

## 2020-04-05 MED ORDER — MIRTAZAPINE 15 MG PO TABS: 15.0000 mg | ORAL_TABLET | Freq: Every day | ORAL | 0 refills | Status: DC

## 2020-04-05 NOTE — Telephone Encounter (Signed)
I have sent Remeron to pharmacy. Klonopin is prescribed per neurology.

## 2020-04-05 NOTE — Telephone Encounter (Signed)
pt called states he needs refills on his medications

## 2020-04-05 NOTE — Telephone Encounter (Signed)
pt called and left message that he needs a refill on his mertazapine also

## 2020-05-02 ENCOUNTER — Encounter: Payer: Self-pay | Admitting: Psychiatry

## 2020-05-02 ENCOUNTER — Other Ambulatory Visit: Payer: Self-pay

## 2020-05-02 ENCOUNTER — Telehealth (INDEPENDENT_AMBULATORY_CARE_PROVIDER_SITE_OTHER): Payer: Medicare Other | Admitting: Psychiatry

## 2020-05-02 DIAGNOSIS — F410 Panic disorder [episodic paroxysmal anxiety] without agoraphobia: Secondary | ICD-10-CM | POA: Diagnosis not present

## 2020-05-02 DIAGNOSIS — F411 Generalized anxiety disorder: Secondary | ICD-10-CM | POA: Diagnosis not present

## 2020-05-02 DIAGNOSIS — Z9114 Patient's other noncompliance with medication regimen: Secondary | ICD-10-CM | POA: Diagnosis not present

## 2020-05-02 DIAGNOSIS — G4701 Insomnia due to medical condition: Secondary | ICD-10-CM | POA: Diagnosis not present

## 2020-05-02 NOTE — Progress Notes (Signed)
Provider Location : [facility] Patient Location : Home  Participants: Patient , Provider  Virtual Visit via Telephone Note  I connected with Gabriel Dillon on 05/02/20 at 10:00 AM EDT by telephone and verified that I am speaking with the correct person using two identifiers.   I discussed the limitations, risks, security and privacy concerns of performing an evaluation and management service by telephone and the availability of in person appointments. I also discussed with the patient that there may be a patient responsible charge related to this service. The patient expressed understanding and agreed to proceed.      I discussed the assessment and treatment plan with the patient. The patient was provided an opportunity to ask questions and all were answered. The patient agreed with the plan and demonstrated an understanding of the instructions.   The patient was advised to call back or seek an in-person evaluation if the symptoms worsen or if the condition fails to improve as anticipated.   [place] MD OP Progress Note  05/02/2020 11:02 AM Ronson Hagins  MRN:  [id number]  Chief Complaint:  Chief Complaint    Follow-up     HPI: Gabriel Dillon is a 64 year old Caucasian male, married, lives in [place], has a history of panic attacks, GAD, insomnia, restless leg syndrome, Parkinson's disease, chronic pain, BPH, insomnia was evaluated by phone today.  Patient today reports he is currently struggling with a lot of pain.  His pain is making his anxiety and sleep worse.   He reports he went to the emergency department recently due to pain however since he had to wait for 7-1/2 hours he just decided to leave without getting help.  He continues to follow-up with his neurologist and pain provider.  He reports however his pain is getting worse.  Patient denies any suicidality, homicidality or perceptual disturbances.  When writer attempted to clarify his medications with him he reported that he was not  sure about what medications he was taking.  Collateral information was obtained from wife-Gabriel Dillon-who reported that patient is no longer taking the venlafaxine which was prescribed for anxiety.  She reports he is in severe pain and that is causing his current anxiety.  She is trying to get him the help.  Per wife patient likely sleeping 6 to 7 hours at night and has a CPAP.  She does not believe he needs another medication at bedtime for his sleep.  Per wife patient was referred for physical therapy however he has tried physical therapy at least 3 times in the past with no benefit.  Per review of medical records he was recently taken off of mirtazapine per neurology due to possible worsening of RLS.  Patient however today reports in spite of stopping the mirtazapine he continues to have severe RLS which is affecting him.     Visit Diagnosis:    ICD-10-CM   1. Panic disorder  F41.0   2. GAD (generalized anxiety disorder)  F41.1   3. Insomnia due to medical condition  G47.01   4. Noncompliance with medication regimen  Z91.14     Past Psychiatric History: I have reviewed past psychiatric history from my progress note on 12/09/2018.  Past Medical History:  Past Medical History:  Diagnosis Date  . Allergic rhinitis   . Back pain   . Enlarged prostate   . Fatty liver   . Gout   . Nephrolithiasis   . Neuromuscular disorder ([place])   . Palpitation   . Panic attacks   .  Panic attacks   . Parkinson's disease [facility])     Past Surgical History:  Procedure Laterality Date  . APPENDECTOMY    . COLONOSCOPY WITH PROPOFOL N/A 10/13/2018   Procedure: COLONOSCOPY WITH PROPOFOL;  Surgeon: Lollie Sails, MD;  Location: [facility] ENDOSCOPY;  Service: Endoscopy;  Laterality: N/A;  . TONSILLECTOMY      Family Psychiatric History: I have reviewed family psychiatric history from my progress note on 12/09/2018  Family History:  Family History  Problem Relation Age of Onset  . Mental illness Neg Hx      Social History: I have reviewed social history from my progress note on 12/09/2018 Social History   Socioeconomic History  . Marital status: Married    Spouse name: Gabriel Dillon  . Number of children: 2  . Years of education: Not on file  . Highest education level: High school graduate  Occupational History  . Not on file  Tobacco Use  . Smoking status: Former Smoker    Packs/day: 2.00    Years: 10.00    Pack years: 20.00    Quit date: 10/17/1992    Years since quitting: 27.5  . Smokeless tobacco: Never Used  Vaping Use  . Vaping Use: Never used  Substance and Sexual Activity  . Alcohol use: No  . Drug use: No  . Sexual activity: Not Currently  Other Topics Concern  . Not on file  Social History Narrative  . Not on file   Social Determinants of Health   Financial Resource Strain:   . Difficulty of Paying Living Expenses: Not on file  Food Insecurity:   . Worried About Charity fundraiser in the Last Year: Not on file  . Ran Out of Food in the Last Year: Not on file  Transportation Needs:   . Lack of Transportation (Medical): Not on file  . Lack of Transportation (Non-Medical): Not on file  Physical Activity:   . Days of Exercise per Week: Not on file  . Minutes of Exercise per Session: Not on file  Stress:   . Feeling of Stress : Not on file  Social Connections:   . Frequency of Communication with Friends and Family: Not on file  . Frequency of Social Gatherings with Friends and Family: Not on file  . Attends Religious Services: Not on file  . Active Member of Clubs or Organizations: Not on file  . Attends Archivist Meetings: Not on file  . Marital Status: Not on file    Allergies:  Allergies  Allergen Reactions  . Ibuprofen Anaphylaxis  . Penicillins Anaphylaxis    Has patient had a PCN reaction causing immediate rash, facial/tongue/throat swelling, SOB or lightheadedness with hypotension: Yes Has patient had a PCN reaction causing severe rash  involving mucus membranes or skin necrosis: Yes Has patient had a PCN reaction that required hospitalization Yes Has patient had a PCN reaction occurring within the last 10 years: Yes If all of the above answers are "NO", then may proceed with Cephalosporin use.   . Prednisone Palpitations  . Citalopram     dizziness   . Valproic Acid Other (See Comments)    drowsiness  . Aspirin Palpitations    Metabolic Disorder Labs: No results found for: HGBA1C, MPG No results found for: PROLACTIN Lab Results  Component Value Date   CHOL 178 11/18/2015   TRIG 167 (H) 11/18/2015   HDL 34 (L) 11/18/2015   CHOLHDL 5.2 11/18/2015   VLDL 33 11/18/2015  LDLCALC 111 (H) 11/18/2015   No results found for: TSH  Therapeutic Level Labs: No results found for: LITHIUM No results found for: VALPROATE No components found for:  CBMZ  Current Medications: Current Outpatient Medications  Medication Sig Dispense Refill  . carbidopa-levodopa (SINEMET CR) 50-200 MG tablet Take 1 tablet by mouth daily.    . carbidopa-levodopa (SINEMET IR) 25-250 MG tablet Take by mouth.    . clonazePAM (KLONOPIN) 1 MG tablet Take by mouth.    . gabapentin (NEURONTIN) 300 MG capsule Take 300 mg in the morning and 300 mg in the afternoon and 900 mg at night    . acetaminophen (TYLENOL) 500 MG tablet Take 1-2 tablets by mouth every 6 (six) hours as needed.    . carbidopa-levodopa (SINEMET IR) 25-250 MG tablet Take 1 tablet by mouth 3 (three) times daily.    . clonazePAM (KLONOPIN) 1 MG tablet Take 1 mg three times a day    . clonazePAM (KLONOPIN) 1 MG tablet Take 1 tablet by mouth 3 (three) times daily.    . colchicine 0.6 MG tablet Take by mouth.    . colchicine 0.6 MG tablet Take by mouth.    . diazepam (VALIUM) 5 MG tablet Take 5 mg 30 minutes prior to MRI    . docusate sodium (COLACE) 100 MG capsule Take 100 mg by mouth 2 (two) times daily.    Marland Kitchen donepezil (ARICEPT) 5 MG tablet Take 5 mg nightly    . furosemide (LASIX)  20 MG tablet Take by mouth.    . gabapentin (NEURONTIN) 600 MG tablet Take 600 mg by mouth at bedtime.    . hydrOXYzine (VISTARIL) 25 MG capsule Take 1 capsule (25 mg total) by mouth at bedtime as needed. For sleep 90 capsule 3  . ketoconazole (NIZORAL) 2 % shampoo APPLY TOPICALLY ONCE AND LEAVE ON FOR 5 MINUTES THEN RINSE. THEN USE TWICE WEEKLY FOR 4 WEEKS, THEN AS NEEDED    . Polyethylene Glycol 3350 (PEG 3350) 17 GM/SCOOP POWD Take by mouth.    . propranolol (INDERAL) 10 MG tablet Take 1 tablet (10 mg total) by mouth 3 (three) times daily as needed. For severe panic attacks 90 tablet 3  . rOPINIRole (REQUIP) 2 MG tablet Take by mouth.    Marland Kitchen rOPINIRole (REQUIP) 2 MG tablet TAKE ONE TABLET BY MOUTH ONCE NIGHTLY    . tamsulosin (FLOMAX) 0.4 MG CAPS capsule Take by mouth.    . vitamin B-12 (CYANOCOBALAMIN) 1000 MCG tablet Take by mouth.     No current facility-administered medications for this visit.     Musculoskeletal: Strength & Muscle Tone: UTA Gait & Station: UTA Patient leans: N/A  Psychiatric Specialty Exam: Review of Systems  Musculoskeletal: Positive for arthralgias and back pain.  Psychiatric/Behavioral: Positive for sleep disturbance. The patient is nervous/anxious.   All other systems reviewed and are negative.   There were no vitals taken for this visit.There is no height or weight on file to calculate BMI.  General Appearance: UTA  Eye Contact:  UTA  Speech:  Clear and Coherent  Volume:  Normal  Mood:  Anxious  Affect:  UTA  Thought Process:  Goal Directed and Descriptions of Associations: Intact  Orientation:  Full (Time, Place, and Person)  Thought Content: Logical   Suicidal Thoughts:  No  Homicidal Thoughts:  No  Memory:  Immediate;   Fair Recent;   Fair Remote;   Fair  Judgement:  Fair  Insight:  Fair  Psychomotor Activity:  Normal  Concentration:  Concentration: Fair and Attention Span: Fair  Recall:  [employer] of Knowledge: Fair  Language: Fair   Akathisia:  No  Handed:  Right  AIMS (if indicated): UTA  Assets:  Communication Skills Desire for Improvement Housing Social Support  ADL's:  Intact  Cognition: WNL  Sleep:  restless   Screenings:   Assessment and Plan: Clete Kuch is a 64 year old Caucasian male, married, disabled, lives in [place], has a history of Parkinson's disease, anxiety, panic attacks, restless leg syndrome, BPH, chronic pain was evaluated by telemedicine today.  Patient with biological predisposition given his multiple medical problems including pain, Parkinson's disease.  Patient with psychosocial stressors of physical limitations due to his medical problems.  Patient has been noncompliant with treatment recommendations.  Patient is already on multiple medications from  neurology which can help with anxiety.  However since his anxiety symptoms are more so because of his current uncontrolled pain ,will benefit from sufficient pain management.  Will not start a new medication at this time.  Discussed plan as noted below.  Plan Panic attacks/GAD-unstable due to pain. Patient is noncompliant with treatment recommendation including the venlafaxine extended release which was prescribed. We will discontinue venlafaxine extended release for noncompliance. Remeron was recently discontinued per neurology due to worsening RLS. Patient does have gabapentin, Klonopin available-prescribed per neurology, will advise him to continue the same.  Discussed with wife as well as patient that if he decides to start a new medication or has worsening mood symptoms after sufficient management of his pain, he could reach [place] back and schedule an appointment for further medication management.  Will not make any changes today.  I have spent atleast 15 minutes non face to face with patient today. More than 50 % of the time was spent for preparing to see the patient ( e.g., review of test, records ),ordering medications and test  ,psychoeducation and supportive psychotherapy and care coordination,as well as documenting clinical information in electronic health record. This note was generated in part or whole with voice recognition software. Voice recognition is usually quite accurate but there are transcription errors that can and very often do occur. I apologize for any typographical errors that were not detected and corrected.        Ursula Alert, MD 05/02/2020, 11:02 AM

## 2021-02-27 ENCOUNTER — Ambulatory Visit: Admission: RE | Admit: 2021-02-27 | Payer: Medicare Other | Source: Home / Self Care

## 2021-02-27 ENCOUNTER — Encounter: Admission: RE | Payer: Self-pay | Source: Home / Self Care

## 2021-02-27 SURGERY — COLONOSCOPY WITH PROPOFOL
Anesthesia: General

## 2021-03-28 ENCOUNTER — Emergency Department
Admission: EM | Admit: 2021-03-28 | Discharge: 2021-03-28 | Disposition: A | Discharge status: Home / Self Care | Payer: Medicare Other | Attending: Emergency Medicine | Admitting: Emergency Medicine

## 2021-03-28 ENCOUNTER — Encounter: Payer: Self-pay | Admitting: *Deleted

## 2021-03-28 ENCOUNTER — Other Ambulatory Visit: Payer: Self-pay

## 2021-03-28 DIAGNOSIS — Z5321 Procedure and treatment not carried out due to patient leaving prior to being seen by health care provider: Secondary | ICD-10-CM | POA: Insufficient documentation

## 2021-03-28 DIAGNOSIS — R42 Dizziness and giddiness: Secondary | ICD-10-CM | POA: Insufficient documentation

## 2021-03-28 DIAGNOSIS — R519 Headache, unspecified: Secondary | ICD-10-CM | POA: Insufficient documentation

## 2021-03-28 LAB — CBC
HCT: 42.9 % (ref 39.0–52.0)
Hemoglobin: 14.4 g/dL (ref 13.0–17.0)
MCH: 32.7 pg (ref 26.0–34.0)
MCHC: 33.6 g/dL (ref 30.0–36.0)
MCV: 97.5 fL (ref 80.0–100.0)
Platelets: 165 10*3/uL (ref 150–400)
RBC: 4.4 MIL/uL (ref 4.22–5.81)
RDW: 13.2 % (ref 11.5–15.5)
WBC: 7.4 10*3/uL (ref 4.0–10.5)
nRBC: 0 % (ref 0.0–0.2)

## 2021-03-28 LAB — BASIC METABOLIC PANEL
Anion gap: 8 (ref 5–15)
BUN: 15 mg/dL (ref 8–23)
CO2: 28 mmol/L (ref 22–32)
Calcium: 9.1 mg/dL (ref 8.9–10.3)
Chloride: 102 mmol/L (ref 98–111)
Creatinine, Ser: 0.96 mg/dL (ref 0.61–1.24)
GFR, Estimated: >60 mL/min (ref >60)
Glucose, Bld: 101 mg/dL — ABNORMAL HIGH (ref 70–99)
Potassium: 4.3 mmol/L (ref 3.5–5.1)
Sodium: 138 mmol/L (ref 135–145)

## 2021-03-28 NOTE — ED Triage Notes (Signed)
FIRST NURSE NOTE:    Migraine x 2 weeks, pt arrived via [facility].  Seen 1 week ago with CT scan, supposed to follow up with PCP but unable to get in and was referred to ED. Per EMS pt is anxious and fidgety.  132/92 p 65 100% cbg 81

## 2021-03-28 NOTE — ED Notes (Signed)
Pt declined to stay for further evaluation, pt ambulatory in lobby. Encouraged to follow up with PCP.

## 2021-03-28 NOTE — ED Triage Notes (Signed)
Pt reports he has had a left (posterior) headache for about 2 weeks. Seen recently at the [facility] ED after he passed out at home, says he had a ct scan done there. He still has a headache, no vomiting or visual changes. Still has some dizziness.

## 2021-04-24 ENCOUNTER — Encounter: Payer: Self-pay | Admitting: General Surgery

## 2021-04-25 ENCOUNTER — Other Ambulatory Visit: Payer: Self-pay

## 2021-04-25 ENCOUNTER — Ambulatory Visit: Payer: Medicare Other | Admitting: Anesthesiology

## 2021-04-25 ENCOUNTER — Encounter: Payer: Self-pay | Admitting: General Surgery

## 2021-04-25 ENCOUNTER — Ambulatory Visit
Admission: RE | Admit: 2021-04-25 | Discharge: 2021-04-25 | Disposition: A | Discharge status: Home / Self Care | Payer: Medicare Other | Source: Ambulatory Visit | Attending: General Surgery | Admitting: General Surgery

## 2021-04-25 ENCOUNTER — Encounter
Admission: RE | Disposition: A | Discharge status: Home / Self Care | Payer: Self-pay | Source: Ambulatory Visit | Attending: General Surgery

## 2021-04-25 DIAGNOSIS — Z888 Allergy status to other drugs, medicaments and biological substances status: Secondary | ICD-10-CM | POA: Diagnosis not present

## 2021-04-25 DIAGNOSIS — Z09 Encounter for follow-up examination after completed treatment for conditions other than malignant neoplasm: Secondary | ICD-10-CM | POA: Insufficient documentation

## 2021-04-25 DIAGNOSIS — Z79899 Other long term (current) drug therapy: Secondary | ICD-10-CM | POA: Insufficient documentation

## 2021-04-25 DIAGNOSIS — Z86010 Personal history of colonic polyps: Secondary | ICD-10-CM | POA: Diagnosis not present

## 2021-04-25 DIAGNOSIS — Z886 Allergy status to analgesic agent status: Secondary | ICD-10-CM | POA: Diagnosis not present

## 2021-04-25 DIAGNOSIS — Z880 Allergy status to penicillin: Secondary | ICD-10-CM | POA: Insufficient documentation

## 2021-04-25 DIAGNOSIS — Z791 Long term (current) use of non-steroidal anti-inflammatories (NSAID): Secondary | ICD-10-CM | POA: Diagnosis not present

## 2021-04-25 DIAGNOSIS — Z87891 Personal history of nicotine dependence: Secondary | ICD-10-CM | POA: Insufficient documentation

## 2021-04-25 HISTORY — PX: COLONOSCOPY WITH PROPOFOL: SHX5780

## 2021-04-25 HISTORY — DX: Other intervertebral disc degeneration, lumbar region: M51.36

## 2021-04-25 HISTORY — DX: Other intervertebral disc degeneration, lumbar region without mention of lumbar back pain or lower extremity pain: M51.369

## 2021-04-25 HISTORY — DX: Sleep apnea, unspecified: G47.30

## 2021-04-25 HISTORY — DX: Benign prostatic hyperplasia without lower urinary tract symptoms: N40.0

## 2021-04-25 HISTORY — DX: Urge incontinence: N39.41

## 2021-04-25 SURGERY — COLONOSCOPY WITH PROPOFOL
Anesthesia: General

## 2021-04-25 MED ORDER — PROPOFOL 500 MG/50ML IV EMUL
INTRAVENOUS | Status: DC | PRN
  Administered 2021-04-25: 150 ug/kg/min via INTRAVENOUS

## 2021-04-25 MED ORDER — PROPOFOL 500 MG/50ML IV EMUL
INTRAVENOUS | Status: AC
  Filled 2021-04-25: qty 50

## 2021-04-25 MED ORDER — SODIUM CHLORIDE 0.9 % IV SOLN: INTRAVENOUS | Status: DC

## 2021-04-25 MED ORDER — PROPOFOL 10 MG/ML IV BOLUS
INTRAVENOUS | Status: AC
  Filled 2021-04-25: qty 20

## 2021-04-25 NOTE — Anesthesia Preprocedure Evaluation (Signed)
Anesthesia Evaluation  Patient identified by MRN, date of birth, ID band Patient awake    Reviewed: Allergy & Precautions, NPO status , Patient's Chart, lab work & pertinent test results  History of Anesthesia Complications Negative for: history of anesthetic complications  Airway Mallampati: III  TM Distance: <3 FB Neck ROM: full    Dental  (+) Chipped, Poor Dentition   Pulmonary neg shortness of breath, sleep apnea , former smoker,    Pulmonary exam normal        Cardiovascular Exercise Tolerance: Good (-) anginaNormal cardiovascular exam     Neuro/Psych PSYCHIATRIC DISORDERS  Neuromuscular disease negative psych ROS   GI/Hepatic negative GI ROS, Neg liver ROS, neg GERD  ,  Endo/Other  negative endocrine ROS  Renal/GU Renal disease  negative genitourinary   Musculoskeletal  (+) Arthritis ,   Abdominal   Peds  Hematology negative hematology ROS (+)   Anesthesia Other Findings Past Medical History: No date: Allergic rhinitis No date: Back pain No date: BPH (benign prostatic hyperplasia) No date: Enlarged prostate No date: Fatty liver No date: Gout No date: Lumbar degenerative disc disease No date: Nephrolithiasis No date: Neuromuscular disorder (HCC) No date: Palpitation No date: Panic attacks No date: Panic attacks No date: Parkinson's disease (HCC) No date: Sleep apnea No date: Urge incontinence  Past Surgical History: No date: APPENDECTOMY 10/13/2018: COLONOSCOPY WITH PROPOFOL; N/A     Comment:  Procedure: COLONOSCOPY WITH PROPOFOL;  Surgeon:               Lollie Sails, MD;  Location: [facility] ENDOSCOPY;                Service: Endoscopy;  Laterality: N/A; No date: TONSILLECTOMY  BMI    Body Mass Index: 41.81 kg/m      Reproductive/Obstetrics negative OB ROS                             Anesthesia Physical Anesthesia Plan  ASA: 3  Anesthesia Plan: General    Post-op Pain Management:    Induction: Intravenous  PONV Risk Score and Plan: Propofol infusion and TIVA  Airway Management Planned: Natural Airway and Nasal Cannula  Additional Equipment:   Intra-op Plan:   Post-operative Plan:   Informed Consent: I have reviewed the patients History and Physical, chart, labs and discussed the procedure including the risks, benefits and alternatives for the proposed anesthesia with the patient or authorized representative who has indicated his/her understanding and acceptance.     Dental Advisory Given  Plan Discussed with: Anesthesiologist, CRNA and Surgeon  Anesthesia Plan Comments: (Patient consented for risks of anesthesia including but not limited to:  - adverse reactions to medications - risk of airway placement if required - damage to eyes, teeth, lips or other oral mucosa - nerve damage due to positioning  - sore throat or hoarseness - Damage to heart, brain, nerves, lungs, other parts of body or loss of life  Patient voiced understanding.)        Anesthesia Quick Evaluation

## 2021-04-25 NOTE — Anesthesia Postprocedure Evaluation (Signed)
Anesthesia Post Note  Patient: Gabriel Dillon  Procedure(s) Performed: COLONOSCOPY WITH PROPOFOL  Patient location during evaluation: Endoscopy Anesthesia Type: General Level of consciousness: awake and alert Pain management: pain level controlled Vital Signs Assessment: post-procedure vital signs reviewed and stable Respiratory status: spontaneous breathing, nonlabored ventilation, respiratory function stable and patient connected to nasal cannula oxygen Cardiovascular status: blood pressure returned to baseline and stable Postop Assessment: no apparent nausea or vomiting Anesthetic complications: no   No notable events documented.   Last Vitals:  Vitals:   04/25/21 1015 04/25/21 1024  BP: 117/76 133/83  Pulse: 66 71  Resp: 12 14  Temp:    SpO2: 97% 98%    Last Pain:  Vitals:   04/25/21 0857  TempSrc: Temporal  PainSc: 0-No pain                 [name]

## 2021-04-25 NOTE — H&P (Signed)
Gabriel Dillon [id number] March 12, 1956     HPI: 65 y/o male for follow up exam after 2020 study showed multiple SSA and TA. Tolerated prep well.   Medications Prior to Admission  Medication Sig Dispense Refill Last Dose   acetaminophen (TYLENOL) 500 MG tablet Take 1-2 tablets by mouth every 6 (six) hours as needed.   Past Week   carbidopa-levodopa (SINEMET CR) 50-200 MG tablet Take 1 tablet by mouth daily.   04/24/2021   carbidopa-levodopa (SINEMET IR) 25-250 MG tablet Take 1 tablet by mouth 3 (three) times daily.   04/25/2021 at 0500   clonazePAM (KLONOPIN) 1 MG tablet Take 1 tablet by mouth 3 (three) times daily.   04/25/2021   cyclobenzaprine (FLEXERIL) 5 MG tablet Take 5 mg by mouth 3 (three) times daily as needed for muscle spasms.   Past Month   docusate sodium (COLACE) 100 MG capsule Take 100 mg by mouth 2 (two) times daily.   Past Week   donepezil (ARICEPT) 5 MG tablet Take 5 mg nightly   Past Week   escitalopram (LEXAPRO) 10 MG tablet Take 10 mg by mouth daily.   Past Week   hydrOXYzine (VISTARIL) 25 MG capsule Take 1 capsule (25 mg total) by mouth at bedtime as needed. For sleep 90 capsule 3 04/24/2021   ketoconazole (NIZORAL) 2 % shampoo APPLY TOPICALLY ONCE AND LEAVE ON FOR 5 MINUTES THEN RINSE. THEN USE TWICE WEEKLY FOR 4 WEEKS, THEN AS NEEDED   Past Week   meloxicam (MOBIC) 7.5 MG tablet Take 7.5 mg by mouth daily.   Past Week   oxyCODONE-acetaminophen (PERCOCET/ROXICET) 5-325 MG tablet Take by mouth every 4 (four) hours as needed for severe pain.   04/22/2021   Polyethylene Glycol 3350 (PEG 3350) 17 GM/SCOOP POWD Take by mouth.   Past Week   propranolol (INDERAL) 10 MG tablet Take 1 tablet (10 mg total) by mouth 3 (three) times daily as needed. For severe panic attacks 90 tablet 3 04/24/2021   rOPINIRole (REQUIP) 2 MG tablet TAKE ONE TABLET BY MOUTH ONCE NIGHTLY   04/24/2021   carbidopa-levodopa (SINEMET IR) 25-250 MG tablet Take by mouth. (Patient not taking: Reported on 04/25/2021)   Not  Taking   clonazePAM (KLONOPIN) 1 MG tablet Take 1 mg three times a day (Patient not taking: Reported on 04/25/2021)   Not Taking at 0400   clonazePAM (KLONOPIN) 1 MG tablet Take by mouth. (Patient not taking: Reported on 04/25/2021)   Not Taking   colchicine 0.6 MG tablet Take by mouth. (Patient not taking: Reported on 04/25/2021)   Not Taking   colchicine 0.6 MG tablet Take by mouth. (Patient not taking: Reported on 04/25/2021)   Not Taking   diazepam (VALIUM) 5 MG tablet Take 5 mg 30 minutes prior to MRI (Patient not taking: Reported on 04/25/2021)   Not Taking   furosemide (LASIX) 20 MG tablet Take by mouth.      gabapentin (NEURONTIN) 300 MG capsule Take 300 mg in the morning and 300 mg in the afternoon and 900 mg at night   04/23/2021   gabapentin (NEURONTIN) 600 MG tablet Take 600 mg by mouth at bedtime.   04/23/2021   rOPINIRole (REQUIP) 2 MG tablet Take by mouth. (Patient not taking: Reported on 04/25/2021)   Not Taking   tamsulosin (FLOMAX) 0.4 MG CAPS capsule Take by mouth. (Patient not taking: Reported on 04/25/2021)   Not Taking   vitamin B-12 (CYANOCOBALAMIN) 1000 MCG tablet Take by mouth.  04/23/2021   Allergies  Allergen Reactions   Ibuprofen Anaphylaxis   Penicillins Anaphylaxis    Has patient had a PCN reaction causing immediate rash, facial/tongue/throat swelling, SOB or lightheadedness with hypotension: Yes Has patient had a PCN reaction causing severe rash involving mucus membranes or skin necrosis: Yes Has patient had a PCN reaction that required hospitalization Yes Has patient had a PCN reaction occurring within the last 10 years: Yes If all of the above answers are "NO", then may proceed with Cephalosporin use.    Prednisone Palpitations   Citalopram     dizziness    Valproic Acid Other (See Comments)    drowsiness   Aspirin Palpitations   Past Medical History:  Diagnosis Date   Allergic rhinitis    Back pain    BPH (benign prostatic hyperplasia)    Enlarged prostate    Fatty  liver    Gout    Lumbar degenerative disc disease    Nephrolithiasis    Neuromuscular disorder (HCC)    Palpitation    Panic attacks    Panic attacks    Parkinson's disease ([place])    Sleep apnea    Urge incontinence    Past Surgical History:  Procedure Laterality Date   APPENDECTOMY     COLONOSCOPY WITH PROPOFOL N/A 10/13/2018   Procedure: COLONOSCOPY WITH PROPOFOL;  Surgeon: Lollie Sails, MD;  Location: [facility] ENDOSCOPY;  Service: Endoscopy;  Laterality: N/A;   TONSILLECTOMY     Social History   Socioeconomic History   Marital status: Married    Spouse name: teresa   Number of children: 2   Years of education: Not on file   Highest education level: High school graduate  Occupational History   Not on file  Tobacco Use   Smoking status: Former    Packs/day: 2.00    Years: 10.00    Pack years: 20.00    Types: Cigarettes    Quit date: 10/17/1992    Years since quitting: 28.5   Smokeless tobacco: Never  Vaping Use   Vaping Use: Never used  Substance and Sexual Activity   Alcohol use: No   Drug use: No   Sexual activity: Not Currently  Other Topics Concern   Not on file  Social History Narrative   Not on file   Social Determinants of Health   Financial Resource Strain: Not on file  Food Insecurity: Not on file  Transportation Needs: Not on file  Physical Activity: Not on file  Stress: Not on file  Social Connections: Not on file  Intimate Partner Violence: Not on file   Social History   Social History Narrative   Not on file     ROS: Negative.   DIAGNOSIS:  A.  COLON POLYP, RECTOSIGMOID JUNCTION; BIOPSY:  -HYPERPLASTIC POLYP.   B.  COLON POLYP X3, HEPATIC FLEXURE; BIOPSY:  -MULTIPLE FRAGMENTS OF TUBULAR ADENOMA.  -NEGATIVE FOR HIGH-GRADE DYSPLASIA AND MALIGNANCY.   C.  COLON POLYP, CECUM; BIOPSY:  - MULTIPLE FRAGMENTS OF SESSILE SERRATED POLYP WITH FOCAL DYSPLASTIC  FEATURES.  (SEE COMMENT).   Comment  In the  previous WHO classification  this lesion would have been  classified as "sessile serrated adenoma, negative for high-grade  dysplasia and malignancy".   D. COLON POLYP, ILEOCECAL VALVE; BIOPSY:  - FRAGMENTS OF TUBULAR ADENOMA.  - NEGATIVE FOR HIGH-GRADE DYSPLASIA AND MALIGNANCY.   E. COLON POLYP, TRANSVERSE; BIOPSY:  - FRAGMENTS OF SESSILE SERRATED POLYP.  - NEGATIVE FOR DYSPLASIA AND MALIGNANCY.  F. RECTUM, POLYP; BIOPSY: HYPERPLASTIC POLYP.   PE: HEENT: Negative. Lungs: Clear. Cardio: RR.  Assessment/Plan:  Proceed with planned endoscopy.   Forest Gleason [facility] 04/25/2021

## 2021-04-25 NOTE — Anesthesia Procedure Notes (Signed)
Date/Time: 04/25/2021 9:31 AM Performed by: Vaughan Sine Pre-anesthesia Checklist: Patient identified, Emergency Drugs available, Patient being monitored, Suction available and Timeout performed Patient Re-evaluated:Patient Re-evaluated prior to induction Oxygen Delivery Method: Supernova nasal CPAP Preoxygenation: Pre-oxygenation with 100% oxygen Induction Type: IV induction Placement Confirmation: positive ETCO2 and CO2 detector

## 2021-04-25 NOTE — Transfer of Care (Signed)
Immediate Anesthesia Transfer of Care Note  Patient: Gabriel Dillon  Procedure(s) Performed: COLONOSCOPY WITH PROPOFOL  Patient Location: PACU  Anesthesia Type:General  Level of Consciousness: awake and sedated  Airway & Oxygen Therapy: Patient Spontanous Breathing and Patient connected to face mask oxygen  Post-op Assessment: Report given to RN and Post -op Vital signs reviewed and stable  Post vital signs: Reviewed and stable  Last Vitals:  Vitals Value Taken Time  BP    Temp    Pulse    Resp    SpO2      Last Pain:  Vitals:   04/25/21 0857  TempSrc: Temporal  PainSc: 0-No pain         Complications: No notable events documented.

## 2021-04-25 NOTE — Op Note (Signed)
[facility] Gastroenterology Patient Name: Gabriel Dillon Procedure Date: 04/25/2021 9:23 AM MRN: [id number] Account #: [id number] Date of Birth: 01-07-1956 Admit Type: Outpatient Age: 65 Room: [facility] ENDO ROOM 1 Gender: Male Note Status: Finalized Instrument Name: Peds Colonoscope [id number] Procedure:             Colonoscopy Indications:           High risk colon cancer surveillance: Personal history                         of colonic polyps Providers:             Robert Bellow, MD Referring MD:          No Local Md, MD (Referring MD) Medicines:             Propofol per Anesthesia Complications:         No immediate complications. Procedure:             Pre-Anesthesia Assessment:                        - Prior to the procedure, a History and Physical was                         performed, and patient medications, allergies and                         sensitivities were reviewed. The patient's tolerance                         of previous anesthesia was reviewed.                        - The risks and benefits of the procedure and the                         sedation options and risks were discussed with the                         patient. All questions were answered and informed                         consent was obtained.                        After obtaining informed consent, the colonoscope was                         passed under direct vision. Throughout the procedure,                         the patient's blood pressure, pulse, and oxygen                         saturations were monitored continuously. The                         Colonoscope was introduced through the anus and  advanced to the the cecum, identified by appendiceal                         orifice and ileocecal valve. The colonoscopy was                         somewhat difficult due to significant looping.                         Successful completion of the  procedure was aided by                         applying abdominal pressure. The patient tolerated the                         procedure well. The quality of the bowel preparation                         was excellent. Findings:      The entire examined colon appeared normal on direct and retroflexion       views. Impression:            - The entire examined colon is normal on direct and                         retroflexion views.                        - No specimens collected. Recommendation:        - Repeat colonoscopy in 5 years for surveillance. Procedure Code(s):     --- Professional ---                        [phone], Colonoscopy, flexible; diagnostic, including                         collection of specimen(s) by brushing or washing, when                         performed (separate procedure) Diagnosis Code(s):     --- Professional ---                        Z86.010, Personal history of colonic polyps CPT copyright 2019 American Medical Association. All rights reserved. The codes documented in this report are preliminary and upon coder review may  be revised to meet current compliance requirements. Robert Bellow, MD 04/25/2021 9:59:52 AM This report has been signed electronically. Number of Addenda: 0 Note Initiated On: 04/25/2021 9:23 AM Scope Withdrawal Time: 0 hours 12 minutes 15 seconds  Total Procedure Duration: 0 hours 21 minutes 3 seconds  Estimated Blood Loss:  Estimated blood loss: none.      [facility]

## 2021-04-26 ENCOUNTER — Emergency Department
Admission: EM | Admit: 2021-04-26 | Discharge: 2021-04-26 | Disposition: A | Discharge status: Home / Self Care | Payer: Medicare Other | Attending: Emergency Medicine | Admitting: Emergency Medicine

## 2021-04-26 ENCOUNTER — Encounter: Payer: Self-pay | Admitting: General Surgery

## 2021-04-26 DIAGNOSIS — M549 Dorsalgia, unspecified: Secondary | ICD-10-CM | POA: Insufficient documentation

## 2021-04-26 DIAGNOSIS — Z5321 Procedure and treatment not carried out due to patient leaving prior to being seen by health care provider: Secondary | ICD-10-CM | POA: Insufficient documentation

## 2021-04-26 NOTE — ED Triage Notes (Signed)
Pt arrived via POV with reports of back pain for the past couple of hours, states he took 1 percocet at 4pm, nothing makes the pain worse or better except sitting still.

## 2021-04-26 NOTE — ED Notes (Signed)
During triage assessment, pt declined any further treatment, pt asked about his vital signs, gave him the vital sign reading and pt stated "that's all it is" pt asking if everything was ok, advised pt that while his vitals are stable right now I do not know what is causing his back pain. Pt decided he was not going to stay to complete triage and was ambulatory out the lobby to call for a ride home.

## 2021-04-26 NOTE — ED Notes (Signed)
First nurse-pt brought in via ems from home with pain between shoulder blades.  Hx chronic pain.  Pt had a colonoscopy yesterday.  Bp160/96 hr 76 fsbs 97 per ems  pt alert

## 2021-06-08 ENCOUNTER — Other Ambulatory Visit: Payer: Self-pay | Admitting: Physical Medicine & Rehabilitation

## 2021-06-08 DIAGNOSIS — G8929 Other chronic pain: Secondary | ICD-10-CM

## 2021-06-08 DIAGNOSIS — M5442 Lumbago with sciatica, left side: Secondary | ICD-10-CM

## 2021-06-12 ENCOUNTER — Ambulatory Visit
Admission: RE | Admit: 2021-06-12 | Discharge: 2021-06-12 | Disposition: A | Discharge status: Home / Self Care | Payer: Medicare Other | Source: Ambulatory Visit | Attending: Physical Medicine & Rehabilitation | Admitting: Physical Medicine & Rehabilitation

## 2021-06-12 ENCOUNTER — Other Ambulatory Visit: Payer: Self-pay

## 2021-06-12 DIAGNOSIS — M5442 Lumbago with sciatica, left side: Secondary | ICD-10-CM | POA: Diagnosis present

## 2021-06-12 DIAGNOSIS — G8929 Other chronic pain: Secondary | ICD-10-CM | POA: Insufficient documentation

## 2021-09-26 ENCOUNTER — Other Ambulatory Visit: Payer: Self-pay

## 2021-10-01 NOTE — Discharge Instructions (Signed)

## 2021-10-03 ENCOUNTER — Ambulatory Visit
Admission: RE | Admit: 2021-10-03 | Discharge: 2021-10-03 | Disposition: A | Discharge status: Home / Self Care | Payer: Medicare PPO | Source: Ambulatory Visit | Attending: Ophthalmology | Admitting: Ophthalmology

## 2021-10-03 ENCOUNTER — Encounter
Admission: RE | Disposition: A | Discharge status: Home / Self Care | Payer: Self-pay | Source: Ambulatory Visit | Attending: Ophthalmology

## 2021-10-03 ENCOUNTER — Ambulatory Visit: Payer: Medicare PPO | Admitting: Anesthesiology

## 2021-10-03 ENCOUNTER — Other Ambulatory Visit: Payer: Self-pay

## 2021-10-03 ENCOUNTER — Encounter: Payer: Self-pay | Admitting: Ophthalmology

## 2021-10-03 DIAGNOSIS — Z6839 Body mass index (BMI) 39.0-39.9, adult: Secondary | ICD-10-CM | POA: Diagnosis not present

## 2021-10-03 DIAGNOSIS — G4730 Sleep apnea, unspecified: Secondary | ICD-10-CM | POA: Diagnosis not present

## 2021-10-03 DIAGNOSIS — G709 Myoneural disorder, unspecified: Secondary | ICD-10-CM | POA: Diagnosis not present

## 2021-10-03 DIAGNOSIS — E669 Obesity, unspecified: Secondary | ICD-10-CM | POA: Diagnosis not present

## 2021-10-03 DIAGNOSIS — Z87891 Personal history of nicotine dependence: Secondary | ICD-10-CM | POA: Insufficient documentation

## 2021-10-03 DIAGNOSIS — N289 Disorder of kidney and ureter, unspecified: Secondary | ICD-10-CM | POA: Diagnosis not present

## 2021-10-03 DIAGNOSIS — H2512 Age-related nuclear cataract, left eye: Secondary | ICD-10-CM | POA: Insufficient documentation

## 2021-10-03 HISTORY — PX: CATARACT EXTRACTION W/PHACO: SHX586

## 2021-10-03 SURGERY — PHACOEMULSIFICATION, CATARACT, WITH IOL INSERTION
Anesthesia: Monitor Anesthesia Care | Site: Eye | Laterality: Left

## 2021-10-03 MED ORDER — LACTATED RINGERS IV SOLN: INTRAVENOUS | Status: DC

## 2021-10-03 MED ORDER — SIGHTPATH DOSE#1 NA HYALUR & NA CHOND-NA HYALUR IO KIT
PACK | INTRAOCULAR | Status: DC | PRN
  Administered 2021-10-03: 1 via OPHTHALMIC

## 2021-10-03 MED ORDER — ACETAMINOPHEN 160 MG/5ML PO SOLN: 325.0000 mg | ORAL | Status: DC | PRN

## 2021-10-03 MED ORDER — ACETAMINOPHEN 325 MG PO TABS: 325.0000 mg | ORAL_TABLET | ORAL | Status: DC | PRN

## 2021-10-03 MED ORDER — SIGHTPATH DOSE#1 NA CHONDROIT SULF-NA HYALURON 20-15 MG/0.5ML IO SOSY
INTRAOCULAR | Status: DC | PRN
  Administered 2021-10-03: .5 mL via INTRAOCULAR

## 2021-10-03 MED ORDER — SIGHTPATH DOSE#1 BSS IO SOLN
INTRAOCULAR | Status: DC | PRN
  Administered 2021-10-03: 154 mL via OPHTHALMIC

## 2021-10-03 MED ORDER — BRIMONIDINE TARTRATE-TIMOLOL 0.2-0.5 % OP SOLN
OPHTHALMIC | Status: DC | PRN
  Administered 2021-10-03: 1 [drp] via OPHTHALMIC

## 2021-10-03 MED ORDER — MIDAZOLAM HCL 2 MG/2ML IJ SOLN
INTRAMUSCULAR | Status: DC | PRN
  Administered 2021-10-03 (×2): 1 mg via INTRAVENOUS

## 2021-10-03 MED ORDER — MOXIFLOXACIN HCL 0.5 % OP SOLN
OPHTHALMIC | Status: DC | PRN
  Administered 2021-10-03: 0.2 mL via OPHTHALMIC

## 2021-10-03 MED ORDER — ARMC OPHTHALMIC DILATING GEL
1.0000 "application " | OPHTHALMIC | Status: DC | PRN
  Administered 2021-10-03 (×3): 1 via OPHTHALMIC

## 2021-10-03 MED ORDER — SIGHTPATH DOSE#1 BSS IO SOLN
INTRAOCULAR | Status: DC | PRN
  Administered 2021-10-03: 1 mL via INTRAMUSCULAR

## 2021-10-03 MED ORDER — SIGHTPATH DOSE#1 BSS IO SOLN
INTRAOCULAR | Status: DC | PRN
  Administered 2021-10-03: 15 mL

## 2021-10-03 MED ORDER — TETRACAINE HCL 0.5 % OP SOLN
1.0000 [drp] | OPHTHALMIC | Status: DC | PRN
  Administered 2021-10-03 (×3): 1 [drp] via OPHTHALMIC

## 2021-10-03 MED ORDER — ONDANSETRON HCL 4 MG/2ML IJ SOLN: 4.0000 mg | Freq: Once | INTRAMUSCULAR | Status: DC | PRN

## 2021-10-03 MED ORDER — FENTANYL CITRATE (PF) 100 MCG/2ML IJ SOLN
INTRAMUSCULAR | Status: DC | PRN
  Administered 2021-10-03 (×2): 50 ug via INTRAVENOUS

## 2021-10-03 SURGICAL SUPPLY — 12 items
CATARACT SUITE SIGHTPATH (MISCELLANEOUS) ×2 IMPLANT
FEE CATARACT SUITE SIGHTPATH (MISCELLANEOUS) ×1 IMPLANT
GLOVE SRG 8 PF TXTR STRL LF DI (GLOVE) ×1 IMPLANT
GLOVE SURG ENC TEXT LTX SZ7.5 (GLOVE) ×2 IMPLANT
GLOVE SURG UNDER POLY LF SZ8 (GLOVE) ×2
LENS IOL TECNIS EYHANCE 21.5 (Intraocular Lens) ×1 IMPLANT
NDL FILTER BLUNT 18X1 1/2 (NEEDLE) ×1 IMPLANT
NEEDLE FILTER BLUNT 18X 1/2SAF (NEEDLE) ×1
NEEDLE FILTER BLUNT 18X1 1/2 (NEEDLE) ×1 IMPLANT
RING MALYGIN 7.0 (MISCELLANEOUS) ×1 IMPLANT
SYR 3ML LL SCALE MARK (SYRINGE) ×2 IMPLANT
WATER STERILE IRR 250ML POUR (IV SOLUTION) ×2 IMPLANT

## 2021-10-03 NOTE — Transfer of Care (Signed)
Immediate Anesthesia Transfer of Care Note  Patient: Gabriel Dillon  Procedure(s) Performed: CATARACT EXTRACTION PHACO AND INTRAOCULAR LENS PLACEMENT (IOC) LEFT (Left: Eye)  Patient Location: PACU  Anesthesia Type: MAC  Level of Consciousness: awake, alert  and patient cooperative  Airway and Oxygen Therapy: Patient Spontanous Breathing and Patient connected to supplemental oxygen  Post-op Assessment: Post-op Vital signs reviewed, Patient's Cardiovascular Status Stable, Respiratory Function Stable, Patent Airway and No signs of Nausea or vomiting  Post-op Vital Signs: Reviewed and stable  Complications: No notable events documented.

## 2021-10-03 NOTE — Anesthesia Preprocedure Evaluation (Deleted)
Anesthesia Evaluation    Airway        Dental   Pulmonary former smoker,          Cardiovascular     Neuro/Psych    GI/Hepatic   Endo/Other    Renal/GU      Musculoskeletal   Abdominal   Peds  Hematology   Anesthesia Other Findings   Reproductive/Obstetrics                             Anesthesia Physical Anesthesia Plan Anesthesia Quick Evaluation  

## 2021-10-03 NOTE — H&P (Signed)
[facility]   Primary Care Physician:  Pcp, No Ophthalmologist: Dr. Merleen Nicely  Pre-Procedure History & Physical: HPI:  Gabriel Dillon is a 66 y.o. male here for ophthalmic surgery.   Past Medical History:  Diagnosis Date   Allergic rhinitis    Back pain    BPH (benign prostatic hyperplasia)    Enlarged prostate    Fatty liver    Gout    Lumbar degenerative disc disease    Nephrolithiasis    Neuromuscular disorder (HCC)    Palpitation    Panic attacks    Panic attacks    Parkinson's disease ([place])    Sleep apnea    Urge incontinence     Past Surgical History:  Procedure Laterality Date   APPENDECTOMY     COLONOSCOPY WITH PROPOFOL N/A 10/13/2018   Procedure: COLONOSCOPY WITH PROPOFOL;  Surgeon: Lollie Sails, MD;  Location: [facility] ENDOSCOPY;  Service: Endoscopy;  Laterality: N/A;   COLONOSCOPY WITH PROPOFOL N/A 04/25/2021   Procedure: COLONOSCOPY WITH PROPOFOL;  Surgeon: Robert Bellow, MD;  Location: [facility] ENDOSCOPY;  Service: Endoscopy;  Laterality: N/A;   TONSILLECTOMY      Prior to Admission medications   Medication Sig Start Date End Date Taking? Authorizing Provider  acetaminophen (TYLENOL) 500 MG tablet Take 1-2 tablets by mouth every 6 (six) hours as needed.   Yes [provider]  carbidopa-levodopa (SINEMET CR) 50-200 MG tablet Take 1 tablet by mouth daily. 04/11/20  Yes [provider]  carbidopa-levodopa (SINEMET IR) 25-250 MG tablet Take 1 tablet by mouth 3 (three) times daily.   Yes [provider]  clonazePAM (KLONOPIN) 1 MG tablet Take 1 tablet by mouth 3 (three) times daily. 02/14/20  Yes [provider]  furosemide (LASIX) 20 MG tablet Take by mouth. 11/26/19 10/03/21 Yes [provider]  gabapentin (NEURONTIN) 300 MG capsule Take 300 mg in the morning and 300 mg in the afternoon and 900 mg at night 04/11/20  Yes [provider]  gabapentin (NEURONTIN) 600 MG tablet Take 600 mg by mouth at  bedtime.   Yes [provider]  hydrOXYzine (VISTARIL) 25 MG capsule Take 1 capsule (25 mg total) by mouth at bedtime as needed. For sleep 04/16/19  Yes Eappen, Ria Clock, MD  meloxicam (MOBIC) 7.5 MG tablet Take 7.5 mg by mouth daily.   Yes [provider]  propranolol (INDERAL) 10 MG tablet Take 1 tablet (10 mg total) by mouth 3 (three) times daily as needed. For severe panic attacks 04/16/19  Yes Eappen, Ria Clock, MD  rOPINIRole (REQUIP) 2 MG tablet TAKE ONE TABLET BY MOUTH ONCE NIGHTLY 10/25/19  Yes [provider]  vitamin B-12 (CYANOCOBALAMIN) 1000 MCG tablet Take by mouth.   Yes [provider]  carbidopa-levodopa (SINEMET IR) 25-250 MG tablet Take by mouth. Patient not taking: Reported on 04/25/2021 03/24/20   [provider]  clonazePAM (KLONOPIN) 1 MG tablet Take 1 mg three times a day Patient not taking: Reported on 04/25/2021 10/26/19   [provider]  cyclobenzaprine (FLEXERIL) 5 MG tablet Take 5 mg by mouth 3 (three) times daily as needed for muscle spasms. Patient not taking: Reported on 09/26/2021    [provider]  docusate sodium (COLACE) 100 MG capsule Take 100 mg by mouth 2 (two) times daily.    [provider]  donepezil (ARICEPT) 5 MG tablet Take 5 mg nightly 09/15/19   [provider]  escitalopram (LEXAPRO) 10 MG tablet Take 10 mg  by mouth daily. Patient not taking: Reported on 09/26/2021    [provider]  ketoconazole (NIZORAL) 2 % shampoo APPLY TOPICALLY ONCE AND LEAVE ON FOR 5 MINUTES THEN RINSE. THEN USE TWICE WEEKLY FOR 4 WEEKS, THEN AS NEEDED 04/13/19   [provider]  oxyCODONE-acetaminophen (PERCOCET/ROXICET) 5-325 MG tablet Take by mouth every 4 (four) hours as needed for severe pain. Patient not taking: Reported on 09/26/2021    [provider]  Polyethylene Glycol 3350 (PEG 3350) 17 GM/SCOOP POWD Take by mouth.    [provider]    Allergies as of 09/07/2021 -  Review Complete 04/25/2021  Allergen Reaction Noted   Ibuprofen Anaphylaxis 11/17/2015   Penicillins Anaphylaxis 11/17/2015   Prednisone Palpitations 12/09/2016   Citalopram  11/17/2015   Valproic acid Other (See Comments) 09/22/2019   Aspirin Palpitations 12/08/2015    Family History  Problem Relation Age of Onset   Mental illness Neg Hx     Social History   Socioeconomic History   Marital status: Married    Spouse name: teresa   Number of children: 2   Years of education: Not on file   Highest education level: High school graduate  Occupational History   Not on file  Tobacco Use   Smoking status: Former    Packs/day: 2.00    Years: 10.00    Pack years: 20.00    Types: Cigarettes    Quit date: 10/17/1992    Years since quitting: 28.9   Smokeless tobacco: Never  Vaping Use   Vaping Use: Never used  Substance and Sexual Activity   Alcohol use: No   Drug use: No   Sexual activity: Not Currently  Other Topics Concern   Not on file  Social History Narrative   Not on file   Social Determinants of Health   Financial Resource Strain: Not on file  Food Insecurity: Not on file  Transportation Needs: Not on file  Physical Activity: Not on file  Stress: Not on file  Social Connections: Not on file  Intimate Partner Violence: Not on file    Review of Systems: See HPI, otherwise negative ROS  Physical Exam: BP 130/68    Pulse 71    Temp (!) 97.5 F (36.4 C) (Temporal)    Ht 5\' 8"  (1.727 m)    Wt 116.8 kg    SpO2 96%    BMI 39.15 kg/m  General:   Alert,  pleasant and cooperative in NAD Head:  Normocephalic and atraumatic. Lungs:  Normal respiratory effort.    Heart:  Regular rate and rhythm.  Impression/Plan: Gabriel Dillon is here for ophthalmic surgery.  Risks, benefits, limitations, and alternatives regarding ophthalmic surgery have been reviewed with the patient.  Questions have been answered.  All parties agreeable.   Leandrew Koyanagi, MD  10/03/2021,  12:44 PM

## 2021-10-03 NOTE — Anesthesia Postprocedure Evaluation (Signed)
Anesthesia Post Note  Patient: Gabriel Dillon  Procedure(s) Performed: CATARACT EXTRACTION PHACO AND INTRAOCULAR LENS PLACEMENT (IOC) LEFT (Left: Eye)     Patient location during evaluation: PACU Anesthesia Type: MAC Level of consciousness: awake and alert Pain management: pain level controlled Vital Signs Assessment: post-procedure vital signs reviewed and stable Respiratory status: spontaneous breathing, nonlabored ventilation, respiratory function stable and patient connected to nasal cannula oxygen Cardiovascular status: stable and blood pressure returned to baseline Postop Assessment: no apparent nausea or vomiting Anesthetic complications: no   No notable events documented.  Sinda Du

## 2021-10-03 NOTE — Anesthesia Preprocedure Evaluation (Signed)
Anesthesia Evaluation  Patient identified by MRN, date of birth, ID band Patient awake    Reviewed: Allergy & Precautions, NPO status , Patient's Chart, lab work & pertinent test results  History of Anesthesia Complications Negative for: history of anesthetic complications  Airway Mallampati: II  TM Distance: >3 FB Neck ROM: Full    Dental no notable dental hx.    Pulmonary sleep apnea , former smoker,    Pulmonary exam normal breath sounds clear to auscultation       Cardiovascular METS: 3 - Mets negative cardio ROS Normal cardiovascular exam Rhythm:Regular Rate:Normal     Neuro/Psych PSYCHIATRIC DISORDERS Anxiety  Neuromuscular disease    GI/Hepatic negative GI ROS, Fatty liver disease   Endo/Other  negative endocrine ROS  Renal/GU Renal disease  negative genitourinary   Musculoskeletal  (+) Arthritis ,   Abdominal (+) + obese,  Abdomen: soft.    Peds negative pediatric ROS (+)  Hematology negative hematology ROS (+)   Anesthesia Other Findings   Reproductive/Obstetrics negative OB ROS                             Anesthesia Physical Anesthesia Plan  ASA: 3  Anesthesia Plan: MAC   Post-op Pain Management: Minimal or no pain anticipated   Induction: Intravenous  PONV Risk Score and Plan: 1 and Treatment may vary due to age or medical condition  Airway Management Planned: Nasal Cannula and Natural Airway  Additional Equipment:   Intra-op Plan:   Post-operative Plan:   Informed Consent: I have reviewed the patients History and Physical, chart, labs and discussed the procedure including the risks, benefits and alternatives for the proposed anesthesia with the patient or authorized representative who has indicated his/her understanding and acceptance.     Dental advisory given  Plan Discussed with: CRNA  Anesthesia Plan Comments:         Anesthesia Quick  Evaluation  Patient Active Problem List   Diagnosis Date Noted   Noncompliance with medication regimen 05/02/2020   High risk medication use 03/21/2020   Lumbar stenosis without neurogenic claudication 03/13/2020   SOB (shortness of breath) on exertion 11/30/2019   Weakness 10/26/2019   Shuffling gait 10/26/2019   OSA (obstructive sleep apnea) 10/26/2019   MCI (mild cognitive impairment) 10/26/2019   Impaired glucose tolerance 09/30/2019   Lymphedema of both lower extremities 09/22/2019   Hyperlipidemia, mixed 09/22/2019   Loss of memory 09/21/2019   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/21/2019   Sleep difficulties 03/16/2019   Anxiety, generalized 02/24/2019   GAD (generalized anxiety disorder) 02/24/2019   Insomnia 02/24/2019   Chronic left-sided low back pain with bilateral sciatica 09/18/2018   Encounter for medication management 09/18/2018   Anxiousness 09/03/2018   BMI 40.0-44.9, adult ([place]) 06/23/2018   History of anxiety 05/07/2018   Sleep disorder 05/07/2018   Restless leg syndrome 02/26/2018   Constipation 06/26/2017   Resting tremor 06/26/2017   Parkinson's disease ([place]) 04/24/2017   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   Fatty liver 07/10/2016   B12 deficiency 04/08/2016   Chest pain 11/17/2015   Allergic rhinitis 08/16/2014   Benign prostatic hyperplasia 08/16/2014   Panic disorder 08/16/2014   Lumbar degenerative disc disease 08/16/2014   Generalized anxiety disorder 08/16/2014   Calculus of kidney 06/29/2014   Frequency of micturition 06/29/2014   Right lower quadrant pain 06/29/2014   Pelvic and perineal pain 04/06/2013   Nocturia 04/06/2013  Male pelvic pain 04/06/2013   Urinary urgency 04/06/2013   Urge incontinence 04/06/2013   Slowing of urinary stream 04/06/2013   Family history of malignant neoplasm 03/13/2012   Prostatitis 02/07/2012    CBC Latest Ref Rng & Units 03/28/2021  11/18/2015 11/17/2015  WBC 4.0 - 10.5 K/uL 7.4 8.4 6.8  Hemoglobin 13.0 - 17.0 g/dL 14.4 14.1 15.6  Hematocrit 39.0 - 52.0 % 42.9 40.6 45.0  Platelets 150 - 400 K/uL 165 168 187   BMP Latest Ref Rng & Units 03/28/2021 05/10/2019 11/19/2015  Glucose 70 - 99 mg/dL 101(H) - 82  BUN 8 - 23 mg/dL 15 - 16  Creatinine 0.61 - 1.24 mg/dL 0.96 1.20 1.12  Sodium 135 - 145 mmol/L 138 - 137  Potassium 3.5 - 5.1 mmol/L 4.3 - 3.8  Chloride 98 - 111 mmol/L 102 - 105  CO2 22 - 32 mmol/L 28 - 24  Calcium 8.9 - 10.3 mg/dL 9.1 - 9.0    Risks and benefits of anesthesia discussed at length, patient or surrogate demonstrates understanding. Appropriately NPO. Plan to proceed with anesthesia.  Champ Mungo, MD 10/03/21

## 2021-10-03 NOTE — Op Note (Signed)
OPERATIVE NOTE  Gabriel Dillon [id number] 10/03/2021   PREOPERATIVE DIAGNOSIS:  Nuclear sclerotic cataract left eye. H25.12   POSTOPERATIVE DIAGNOSIS:    Nuclear sclerotic cataract left eye.     PROCEDURE:  Phacoemusification with posterior chamber intraocular lens placement of the left eye  Ultrasound time: Procedure(s) with comments: CATARACT EXTRACTION PHACO AND INTRAOCULAR LENS PLACEMENT (IOC) LEFT (Left) - 5.89 00:28.9  LENS:   Implant Name Type Inv. Item Serial No. Manufacturer Lot No. LRB No. Used Action  LENS IOL TECNIS EYHANCE 21.5 - [id number] Intraocular Lens LENS IOL TECNIS EYHANCE 21.5 [id number] [employer]  Left 1 Implanted      SURGEON:  Merleen Nicely, MD   ANESTHESIA:  Topical with tetracaine drops and 2% Xylocaine jelly, augmented with 1% preservative-free intracameral lidocaine.    COMPLICATIONS:  None.   DESCRIPTION OF PROCEDURE:  The patient was identified in the holding room and transported to the operating room and placed in the supine position under the operating microscope.  The left eye was identified as the operative eye and it was prepped and draped in the usual sterile ophthalmic fashion.   A 1 millimeter clear-corneal paracentesis was made at the inferonasal position.  0.5 ml of preservative-free 1% lidocaine was injected into the anterior chamber.  The anterior chamber was filled with Viscoat viscoelastic.  A 2.4 millimeter keratome was used to make a near-clear corneal incision at the superotemporal position.  .  A curvilinear capsulorrhexis was made with a cystotome and capsulorrhexis forceps.  Balanced salt solution was used to hydrodissect and hydrodelineate the nucleus.   Phacoemulsification was then used in tilt and tumble fashion to remove the lens nucleus and epinucleus.  The remaining cortex was then removed using the irrigation and aspiration handpiece. A small posterior capsular rupture was noted near the paracentesis underneath the iris. It  was covered with Viscoat and did not extend further, no vitreous prolapse was noted. Provisc was then placed into the capsular bag to distend it for lens placement.  A lens was then injected into the capsular bag.  The remaining viscoelastic was aspirated.   Wounds were hydrated with balanced salt solution.  The anterior chamber was inflated to a physiologic pressure with balanced salt solution. 0.2 ml of intracameral Vigamox was injected into the eye. No wound leaks were noted.   Timolol and Brimonidine drops were applied to the eye. Maxitrol ointment was applied for a small epithelial defect near the main incision. The patient was taken to the recovery room in stable condition without complications of anesthesia or surgery.  [name] 10/03/2021, 2:29 PM

## 2021-10-03 NOTE — Anesthesia Procedure Notes (Signed)
Procedure Name: MAC Date/Time: 10/03/2021 1:38 PM Performed by: Cameron Ali, CRNA Pre-anesthesia Checklist: Patient identified, Emergency Drugs available, Suction available, Timeout performed and Patient being monitored Patient Re-evaluated:Patient Re-evaluated prior to induction Oxygen Delivery Method: Nasal cannula Placement Confirmation: positive ETCO2

## 2021-10-04 ENCOUNTER — Encounter: Payer: Self-pay | Admitting: Ophthalmology

## 2021-10-04 ENCOUNTER — Other Ambulatory Visit: Payer: Self-pay

## 2021-10-15 NOTE — Discharge Instructions (Signed)

## 2021-10-17 ENCOUNTER — Ambulatory Visit
Admission: RE | Admit: 2021-10-17 | Discharge: 2021-10-17 | Disposition: A | Discharge status: Home / Self Care | Payer: Medicare PPO | Source: Ambulatory Visit | Attending: Ophthalmology | Admitting: Ophthalmology

## 2021-10-17 ENCOUNTER — Ambulatory Visit: Payer: Medicare PPO | Admitting: Anesthesiology

## 2021-10-17 ENCOUNTER — Encounter: Payer: Self-pay | Admitting: Ophthalmology

## 2021-10-17 ENCOUNTER — Other Ambulatory Visit: Payer: Self-pay

## 2021-10-17 ENCOUNTER — Encounter
Admission: RE | Disposition: A | Discharge status: Home / Self Care | Payer: Self-pay | Source: Ambulatory Visit | Attending: Ophthalmology

## 2021-10-17 DIAGNOSIS — F410 Panic disorder [episodic paroxysmal anxiety] without agoraphobia: Secondary | ICD-10-CM | POA: Diagnosis not present

## 2021-10-17 DIAGNOSIS — M1990 Unspecified osteoarthritis, unspecified site: Secondary | ICD-10-CM | POA: Diagnosis not present

## 2021-10-17 DIAGNOSIS — G4730 Sleep apnea, unspecified: Secondary | ICD-10-CM | POA: Diagnosis not present

## 2021-10-17 DIAGNOSIS — H2511 Age-related nuclear cataract, right eye: Secondary | ICD-10-CM | POA: Insufficient documentation

## 2021-10-17 DIAGNOSIS — Z87891 Personal history of nicotine dependence: Secondary | ICD-10-CM | POA: Diagnosis not present

## 2021-10-17 HISTORY — PX: CATARACT EXTRACTION W/PHACO: SHX586

## 2021-10-17 SURGERY — PHACOEMULSIFICATION, CATARACT, WITH IOL INSERTION
Anesthesia: Monitor Anesthesia Care | Laterality: Right

## 2021-10-17 MED ORDER — SIGHTPATH DOSE#1 NA HYALUR & NA CHOND-NA HYALUR IO KIT
PACK | INTRAOCULAR | Status: DC | PRN
  Administered 2021-10-17: 1 via OPHTHALMIC

## 2021-10-17 MED ORDER — LIDOCAINE HCL (PF) 2 % IJ SOLN
INTRAOCULAR | Status: DC | PRN
  Administered 2021-10-17: 1 mL via INTRAOCULAR

## 2021-10-17 MED ORDER — ACETAMINOPHEN 10 MG/ML IV SOLN: 1000.0000 mg | Freq: Once | INTRAVENOUS | Status: DC | PRN

## 2021-10-17 MED ORDER — ACETAMINOPHEN 325 MG PO TABS
650.0000 mg | ORAL_TABLET | Freq: Four times a day (QID) | ORAL | Status: DC | PRN
  Administered 2021-10-17: 650 mg via ORAL

## 2021-10-17 MED ORDER — SIGHTPATH DOSE#1 BSS IO SOLN
INTRAOCULAR | Status: DC | PRN
Start: 2021-10-17 — End: 2021-10-17
  Administered 2021-10-17: 15 mL

## 2021-10-17 MED ORDER — LACTATED RINGERS IV SOLN: INTRAVENOUS | Status: DC

## 2021-10-17 MED ORDER — BRIMONIDINE TARTRATE-TIMOLOL 0.2-0.5 % OP SOLN
OPHTHALMIC | Status: DC | PRN
  Administered 2021-10-17: 1 [drp] via OPHTHALMIC

## 2021-10-17 MED ORDER — SIGHTPATH DOSE#1 BSS IO SOLN
INTRAOCULAR | Status: DC | PRN
  Administered 2021-10-17: 72 mL via OPHTHALMIC

## 2021-10-17 MED ORDER — TETRACAINE HCL 0.5 % OP SOLN
1.0000 [drp] | OPHTHALMIC | Status: DC | PRN
  Administered 2021-10-17 (×3): 1 [drp] via OPHTHALMIC

## 2021-10-17 MED ORDER — MIDAZOLAM HCL 2 MG/2ML IJ SOLN
INTRAMUSCULAR | Status: DC | PRN
Start: 2021-10-17 — End: 2021-10-17
  Administered 2021-10-17 (×2): 1 mg via INTRAVENOUS

## 2021-10-17 MED ORDER — FENTANYL CITRATE (PF) 100 MCG/2ML IJ SOLN
INTRAMUSCULAR | Status: DC | PRN
  Administered 2021-10-17 (×2): 50 ug via INTRAVENOUS

## 2021-10-17 MED ORDER — ONDANSETRON HCL 4 MG/2ML IJ SOLN: 4.0000 mg | Freq: Once | INTRAMUSCULAR | Status: DC | PRN

## 2021-10-17 MED ORDER — ARMC OPHTHALMIC DILATING DROPS
1.0000 "application " | OPHTHALMIC | Status: AC | PRN
  Administered 2021-10-17 (×3): 1 via OPHTHALMIC

## 2021-10-17 MED ORDER — MOXIFLOXACIN HCL 0.5 % OP SOLN
OPHTHALMIC | Status: DC | PRN
  Administered 2021-10-17: 0.2 mL via OPHTHALMIC

## 2021-10-17 SURGICAL SUPPLY — 16 items
CATARACT SUITE SIGHTPATH (MISCELLANEOUS) ×2 IMPLANT
DISSECTOR HYDRO NUCLEUS 50X22 (MISCELLANEOUS) ×2 IMPLANT
DRSG TEGADERM 2-3/8X2-3/4 SM (GAUZE/BANDAGES/DRESSINGS) ×2 IMPLANT
FEE CATARACT SUITE SIGHTPATH (MISCELLANEOUS) ×1 IMPLANT
GLOVE SURG GAMMEX PI TX LF 7.5 (GLOVE) ×2 IMPLANT
GLOVE SURG SYN 8.5  E (GLOVE) ×1
GLOVE SURG SYN 8.5 E (GLOVE) ×1 IMPLANT
GLOVE SURG SYN 8.5 PF PI (GLOVE) ×1 IMPLANT
LENS IOL TECNIS EYHANCE 20.5 (Intraocular Lens) ×1 IMPLANT
NDL FILTER BLUNT 18X1 1/2 (NEEDLE) ×1 IMPLANT
NEEDLE FILTER BLUNT 18X 1/2SAF (NEEDLE) ×1
NEEDLE FILTER BLUNT 18X1 1/2 (NEEDLE) ×1 IMPLANT
SYR 3ML LL SCALE MARK (SYRINGE) ×2 IMPLANT
SYR 5ML LL (SYRINGE) ×2 IMPLANT
TIP IRRIGATON/ASPIRATION (MISCELLANEOUS) ×1 IMPLANT
WATER STERILE IRR 250ML POUR (IV SOLUTION) ×2 IMPLANT

## 2021-10-17 NOTE — Anesthesia Postprocedure Evaluation (Signed)
Anesthesia Post Note ? ?Patient: Gabriel Dillon ? ?Procedure(s) Performed: CATARACT EXTRACTION PHACO AND INTRAOCULAR LENS PLACEMENT (IOC) RIGHT (Right) ? ? ?  ?Patient location during evaluation: PACU ?Anesthesia Type: MAC ?Level of consciousness: awake and alert ?Pain management: pain level controlled ?Vital Signs Assessment: post-procedure vital signs reviewed and stable ?Respiratory status: spontaneous breathing, nonlabored ventilation, respiratory function stable and patient connected to nasal cannula oxygen ?Cardiovascular status: stable and blood pressure returned to baseline ?Postop Assessment: no apparent nausea or vomiting ?Anesthetic complications: no ? ? ?No notable events documented. ? ?[name]  Jael Kostick ? ? ? ? ? ?

## 2021-10-17 NOTE — Anesthesia Preprocedure Evaluation (Signed)
Anesthesia Evaluation  ?Patient identified by MRN, date of birth, ID band ?Patient awake ? ? ? ?Reviewed: ?Allergy & Precautions, NPO status , Patient's Chart, lab work & pertinent test results, reviewed documented beta blocker date and time  ? ?History of Anesthesia Complications ?Negative for: history of anesthetic complications ? ?Airway ?Mallampati: III ? ?TM Distance: >3 FB ?Neck ROM: Limited ? ? ? Dental ?  ?Pulmonary ?sleep apnea , former smoker,  ?  ?breath sounds clear to auscultation ? ? ? ? ? ? Cardiovascular ?Exercise Tolerance: Poor ?(-) angina+ DOE (Chronic, stable)  ? ?Rhythm:Regular Rate:Normal ? ? ?HLD ?  ?Neuro/Psych ?PSYCHIATRIC DISORDERS (Panic attacks) Anxiety  Neuromuscular disease (Sciatica, lumbar stenosis)   ? GI/Hepatic ?neg GERD  , ?Fatty liver ?  ?Endo/Other  ? ? Renal/GU ?Renal disease ([place])  ? ?  ?Musculoskeletal ? ?(+) Arthritis ,  ?Gout  ? Abdominal ?  ?Peds ? Hematology ?  ?Anesthesia Other Findings ? ? Reproductive/Obstetrics ? ?  ? ? ? ? ? ? ? ? ? ? ? ? ? ?  ?  ? ? ? ? ? ? ? ? ?Anesthesia Physical ?Anesthesia Plan ? ?ASA: 3 ? ?Anesthesia Plan: MAC  ? ?Post-op Pain Management:   ? ?Induction: Intravenous ? ?PONV Risk Score and Plan: 1 and TIVA, Midazolam and Treatment may vary due to age or medical condition ? ?Airway Management Planned: Nasal Cannula ? ?Additional Equipment:  ? ?Intra-op Plan:  ? ?Post-operative Plan:  ? ?Informed Consent: I have reviewed the patients History and Physical, chart, labs and discussed the procedure including the risks, benefits and alternatives for the proposed anesthesia with the patient or authorized representative who has indicated his/her understanding and acceptance.  ? ? ? ? ? ?Plan Discussed with: CRNA and Anesthesiologist ? ?Anesthesia Plan Comments:   ? ? ? ? ? ? ?Anesthesia Quick Evaluation ? ?

## 2021-10-17 NOTE — Op Note (Signed)
OPERATIVE NOTE  Gabriel Dillon [id number] 10/17/2021   PREOPERATIVE DIAGNOSIS: Nuclear sclerotic cataract right eye. H25.11   POSTOPERATIVE DIAGNOSIS: Nuclear sclerotic cataract right eye. H25.11   PROCEDURE:  Phacoemusification with posterior chamber intraocular lens placement of the right eye  Ultrasound time: Procedure(s) with comments: CATARACT EXTRACTION PHACO AND INTRAOCULAR LENS PLACEMENT (IOC) RIGHT (Right) - 3.20 00:31.0  LENS:   Implant Name Type Inv. Item Serial No. Manufacturer Lot No. LRB No. Used Action  LENS IOL TECNIS EYHANCE 20.5 - [id number] Intraocular Lens LENS IOL TECNIS EYHANCE 20.5 [id number] [employer]  Right 1 Implanted      SURGEON:  Courtney Heys. Lazarus Salines, MD   ANESTHESIA:  Topical with tetracaine drops, augmented with 1% preservative-free intracameral lidocaine.   COMPLICATIONS:  None.   DESCRIPTION OF PROCEDURE:  The patient was identified in the holding room and transported to the operating room and placed in the supine position under the operating microscope.  The right eye was identified as the operative eye, which was prepped and draped in the usual sterile ophthalmic fashion.   A 1 millimeter clear-corneal paracentesis was made superotemporally. Preservative-free 1% lidocaine mixed with 1:1,000 bisulfite-free aqueous solution of epinephrine was injected into the anterior chamber. The anterior chamber was then filled with Viscoat viscoelastic. A 2.4 millimeter keratome was used to make a clear-corneal incision inferotemporally. A curvilinear capsulorrhexis was made with a cystotome and capsulorrhexis forceps. Balanced salt solution was used to hydrodissect and hydrodelineate the nucleus. Phacoemulsification was then used to remove the lens nucleus and epinucleus. The remaining cortex was then removed using the irrigation and aspiration handpiece. Provisc was then placed into the capsular bag to distend it for lens placement. An intraocular lens was then injected  into the capsular bag. The remaining viscoelastic was aspirated.   Wounds were hydrated with balanced salt solution.  The anterior chamber was inflated to a physiologic pressure with balanced salt solution.  No wound leaks were noted. Intracameral Vigamox injected into the anterior chamber.  Timolol and Brimonidine drops were applied to the eye.  The patient was taken to the recovery room in stable condition without complications of anesthesia or surgery.  [name] 10/17/2021, 2:06 PM

## 2021-10-17 NOTE — H&P (Signed)
[facility]  ? ?Primary Care Physician:  Pcp, No ?Ophthalmologist: Dr. Merleen Nicely ? ?Pre-Procedure History & Physical: ?HPI:  Gabriel Dillon is a 66 y.o. male here for cataract surgery. ?  ?Past Medical History:  ?Diagnosis Date  ? Allergic rhinitis   ? Back pain   ? BPH (benign prostatic hyperplasia)   ? Enlarged prostate   ? Fatty liver   ? Gout   ? Lumbar degenerative disc disease   ? Nephrolithiasis   ? Neuromuscular disorder ([place])   ? Palpitation   ? Panic attacks   ? Panic attacks   ? Parkinson's disease ([place])   ? Sleep apnea   ? Urge incontinence   ? ? ?Past Surgical History:  ?Procedure Laterality Date  ? APPENDECTOMY    ? CATARACT EXTRACTION W/PHACO Left 10/03/2021  ? Procedure: CATARACT EXTRACTION PHACO AND INTRAOCULAR LENS PLACEMENT ([place]) LEFT;  Surgeon: Leandrew Koyanagi, MD;  Location: [facility];  Service: Ophthalmology;  Laterality: Left;  5.89 ?00:28.9  ? COLONOSCOPY WITH PROPOFOL N/A 10/13/2018  ? Procedure: COLONOSCOPY WITH PROPOFOL;  Surgeon: Lollie Sails, MD;  Location: [facility] ENDOSCOPY;  Service: Endoscopy;  Laterality: N/A;  ? COLONOSCOPY WITH PROPOFOL N/A 04/25/2021  ? Procedure: COLONOSCOPY WITH PROPOFOL;  Surgeon: Robert Bellow, MD;  Location: [facility] ENDOSCOPY;  Service: Endoscopy;  Laterality: N/A;  ? TONSILLECTOMY    ? ? ?Prior to Admission medications   ?Medication Sig Start Date End Date Taking? Authorizing Provider  ?acetaminophen (TYLENOL) 500 MG tablet Take 1-2 tablets by mouth every 6 (six) hours as needed.   Yes [provider]  ?carbidopa-levodopa (SINEMET CR) 50-200 MG tablet Take 1 tablet by mouth daily. 04/11/20  Yes [provider]  ?carbidopa-levodopa (SINEMET IR) 25-250 MG tablet Take 1 tablet by mouth 3 (three) times daily.   Yes [provider]  ?clonazePAM (KLONOPIN) 1 MG tablet Take 1 tablet by mouth 3 (three) times daily. 02/14/20  Yes [provider]  ?docusate sodium (COLACE) 100 MG capsule Take 100 mg by  mouth 2 (two) times daily.   Yes [provider]  ?donepezil (ARICEPT) 5 MG tablet Take 5 mg nightly 09/15/19  Yes [provider]  ?furosemide (LASIX) 20 MG tablet Take by mouth. 11/26/19 10/17/21 Yes [provider]  ?gabapentin (NEURONTIN) 300 MG capsule Take 300 mg in the morning and 300 mg in the afternoon and 900 mg at night 04/11/20  Yes [provider]  ?gabapentin (NEURONTIN) 600 MG tablet Take 600 mg by mouth at bedtime.   Yes [provider]  ?hydrOXYzine (VISTARIL) 25 MG capsule Take 1 capsule (25 mg total) by mouth at bedtime as needed. For sleep 04/16/19  Yes Eappen, Ria Clock, MD  ?ketoconazole (NIZORAL) 2 % shampoo APPLY TOPICALLY ONCE AND LEAVE ON FOR 5 MINUTES THEN RINSE. THEN USE TWICE WEEKLY FOR 4 WEEKS, THEN AS NEEDED 04/13/19  Yes [provider]  ?meloxicam (MOBIC) 7.5 MG tablet Take 7.5 mg by mouth daily.   Yes [provider]  ?Polyethylene Glycol 3350 (PEG 3350) 17 GM/SCOOP POWD Take by mouth.   Yes [provider]  ?propranolol (INDERAL) 10 MG tablet Take 1 tablet (10 mg total) by mouth 3 (three) times daily as needed. For severe panic attacks 04/16/19  Yes Eappen, Ria Clock, MD  ?rOPINIRole (REQUIP) 2 MG tablet TAKE ONE TABLET BY MOUTH ONCE NIGHTLY 10/25/19  Yes [provider]  ?vitamin B-12 (CYANOCOBALAMIN) 1000 MCG tablet Take by mouth.   Yes [provider]  ?carbidopa-levodopa (SINEMET IR) 25-250 MG tablet Take by mouth. ?Patient not taking: Reported on 04/25/2021 03/24/20   [provider]  ?clonazePAM (KLONOPIN) 1 MG tablet Take 1 mg three times a day ?Patient not taking: Reported on 04/25/2021 10/26/19   [provider]  ?cyclobenzaprine (FLEXERIL) 5 MG tablet Take 5 mg by mouth 3 (three) times daily as needed for muscle spasms. ?Patient not taking: Reported on 09/26/2021    [provider]  ?escitalopram (LEXAPRO) 10 MG tablet Take 10 mg by mouth daily. ?Patient not taking: Reported on  09/26/2021    [provider]  ?oxyCODONE-acetaminophen (PERCOCET/ROXICET) 5-325 MG tablet Take by mouth every 4 (four) hours as needed for severe pain. ?Patient not taking: Reported on 09/26/2021    [provider]  ? ? ?Allergies as of 09/07/2021 - Review Complete 04/25/2021  ?Allergen Reaction Noted  ? Ibuprofen Anaphylaxis 11/17/2015  ? Penicillins Anaphylaxis 11/17/2015  ? Prednisone Palpitations 12/09/2016  ? Citalopram  11/17/2015  ? Valproic acid Other (See Comments) 09/22/2019  ? Aspirin Palpitations 12/08/2015  ? ? ?Family History  ?Problem Relation Age of Onset  ? Mental illness Neg Hx   ? ? ?Social History  ? ?Socioeconomic History  ? Marital status: Married  ?  Spouse name: teresa  ? Number of children: 2  ? Years of education: Not on file  ? Highest education level: High school graduate  ?Occupational History  ? Not on file  ?Tobacco Use  ? Smoking status: Former  ?  Packs/day: 2.00  ?  Years: 10.00  ?  Pack years: 20.00  ?  Types: Cigarettes  ?  Quit date: 10/17/1992  ?  Years since quitting: 29.0  ? Smokeless tobacco: Never  ?Vaping Use  ? Vaping Use: Never used  ?Substance and Sexual Activity  ? Alcohol use: No  ? Drug use: No  ? Sexual activity: Not Currently  ?Other Topics Concern  ? Not on file  ?Social History Narrative  ? Not on file  ? ?Social Determinants of Health  ? ?Financial Resource Strain: Not on file  ?Food Insecurity: Not on file  ?Transportation Needs: Not on file  ?Physical Activity: Not on file  ?Stress: Not on file  ?Social Connections: Not on file  ?Intimate Partner Violence: Not on file  ? ? ?Review of Systems: ?See HPI, otherwise negative ROS ? ?Physical Exam: ?BP 98/68   Pulse (!) 58   Temp (!) 97.5 ?F (36.4 ?C) (Temporal)   Resp 14   Ht 5\' 8"  (1.727 m)   Wt 117.5 kg   SpO2 99%   BMI 39.38 kg/m?  ?General:   Alert, cooperative in NAD ?Head:  Normocephalic and atraumatic. ?Respiratory:  Normal work of breathing. ?Cardiovascular:   RRR ? ?Impression/Plan: ?Gabriel Dillon is here for cataract surgery. ? ?Risks, benefits, limitations, and alternatives regarding cataract surgery have been reviewed with the patient.  Questions have been answered.  All parties agreeable. ? ? ?Norvel Richards, MD  10/17/2021, 1:16 PM  ?

## 2021-10-17 NOTE — Transfer of Care (Signed)
Immediate Anesthesia Transfer of Care Note ? ?Patient: Gabriel Dillon ? ?Procedure(s) Performed: CATARACT EXTRACTION PHACO AND INTRAOCULAR LENS PLACEMENT (IOC) RIGHT (Right) ? ?Patient Location: PACU ? ?Anesthesia Type: MAC ? ?Level of Consciousness: awake, alert  and patient cooperative ? ?Airway and Oxygen Therapy: Patient Spontanous Breathing and Patient connected to supplemental oxygen ? ?Post-op Assessment: Post-op Vital signs reviewed, Patient's Cardiovascular Status Stable, Respiratory Function Stable, Patent Airway and No signs of Nausea or vomiting ? ?Post-op Vital Signs: Reviewed and stable ? ?Complications: No notable events documented. ? ?

## 2021-10-19 ENCOUNTER — Encounter: Payer: Self-pay | Admitting: Ophthalmology

## 2022-02-19 IMAGING — MR MR LUMBAR SPINE W/O CM
4 of 5 series · 26 of 48 positions shown · non-contrast
Comparison: Lumbar radiographs 07/31/2010. Lumbar MRI 03/11/2020.

CLINICAL DATA: 66-year-old male with persistent low back pain. For
2+ weeks pain on the right side, some involvement of the left lower
extremity.

EXAM:
MRI LUMBAR SPINE WITHOUT CONTRAST
TECHNIQUE: Multiplanar, multisequence MR imaging of the lumbar spine was
performed. No intravenous contrast was administered.

[Series 5: T2 · sagittal · 4.0mm · 0.81mm/px · 6 of 17 slices shown (1 of 2)]
[im 1/17]
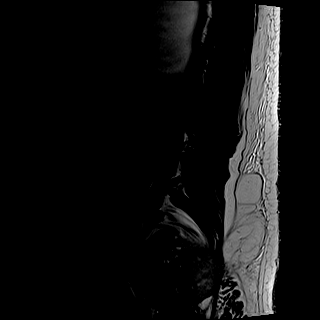
[im 4/17]
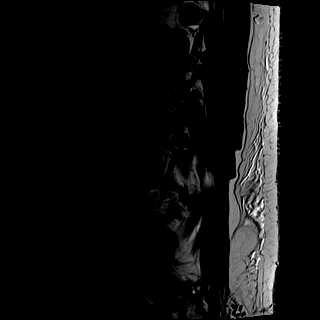
[im 7/17]
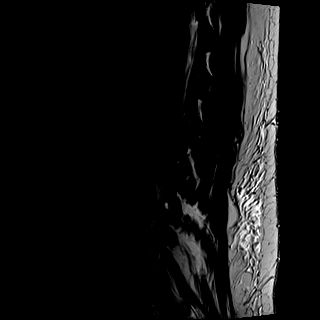
[im 10/17]
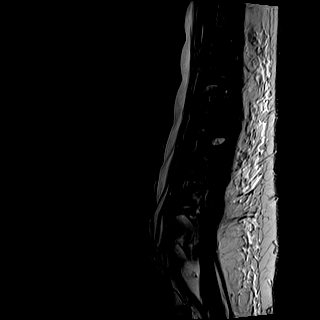
[im 13/17]
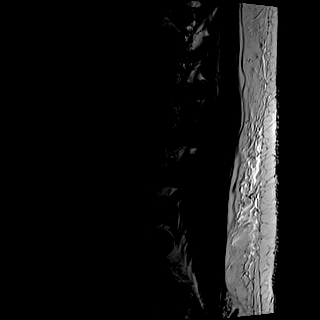
[im 17/17]
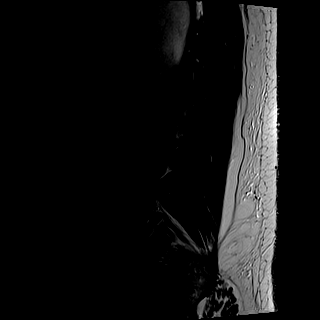

[Series 6: T1 · sagittal · 4.0mm · 0.81mm/px · 6 of 17 slices shown (1 of 2)]
[im 1/17]
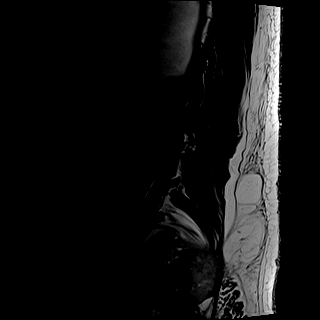
[im 4/17]
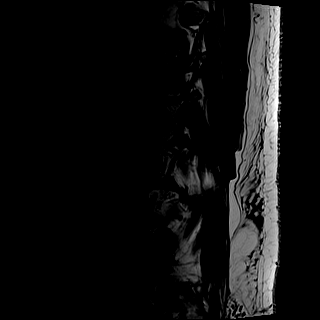
[im 7/17]
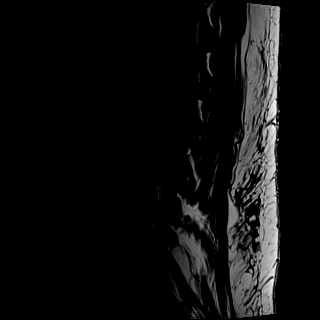
[im 10/17]
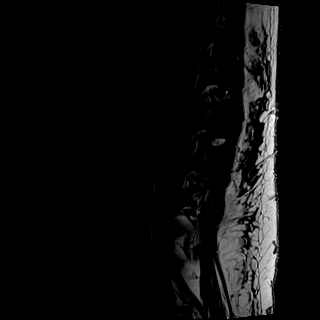
[im 13/17]
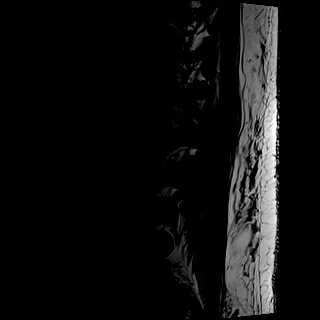
[im 17/17]
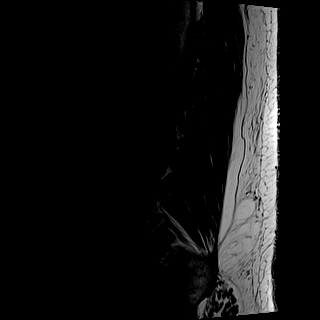

[Series 10: T2 · axial · 4.0mm · 0.52mm/px · z∈[-84,+122]mm · 9 of 40 slices shown (2 of 2)]
[im 1/40]
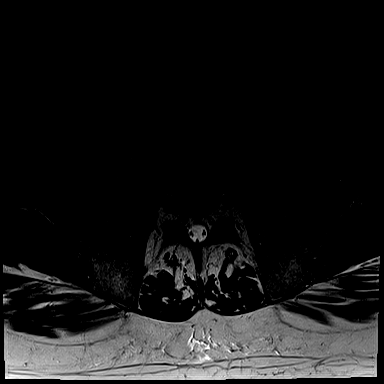
[im 6/40]
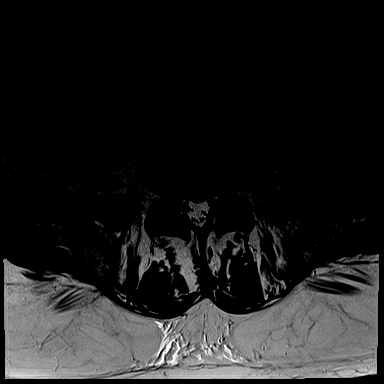
[im 12/40]
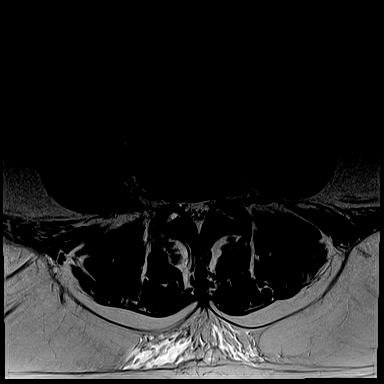
[im 17/40]
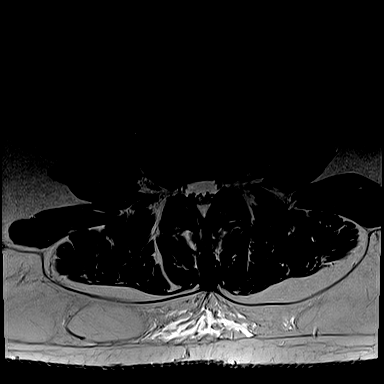
[im 20/40]
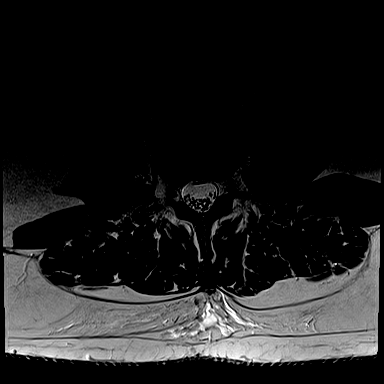
[im 23/40]
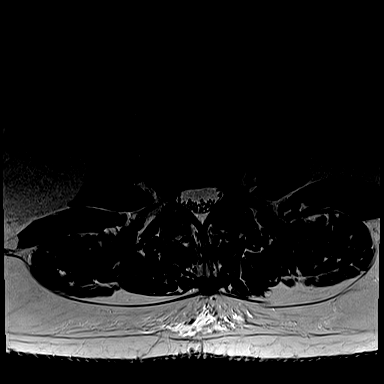
[im 28/40]
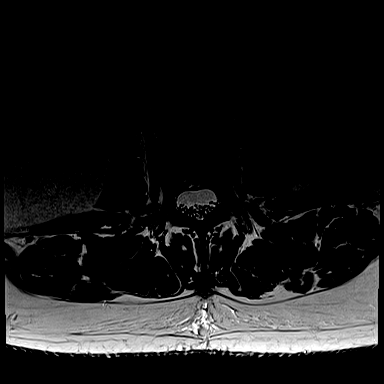
[im 34/40]
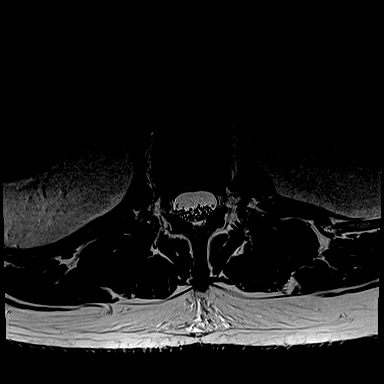
[im 40/40]
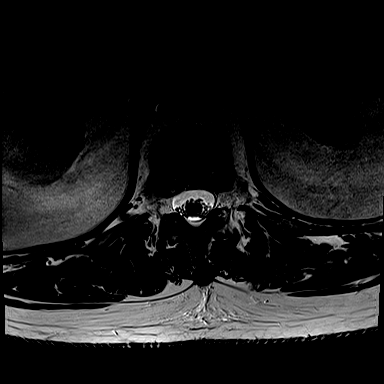

[Series 13: T1 · axial · 4.0mm · 0.31mm/px · z∈[-84,+93]mm · 5 of 40 slices shown (2 of 2)]
[im 1/40]
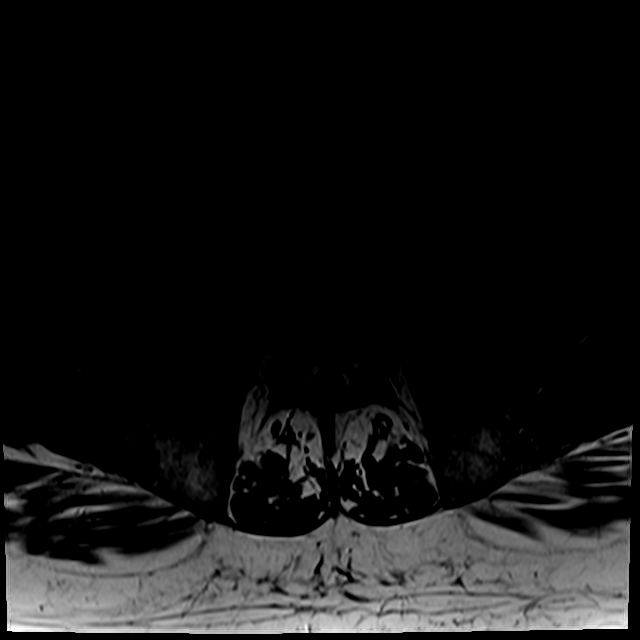
[im 6/40]
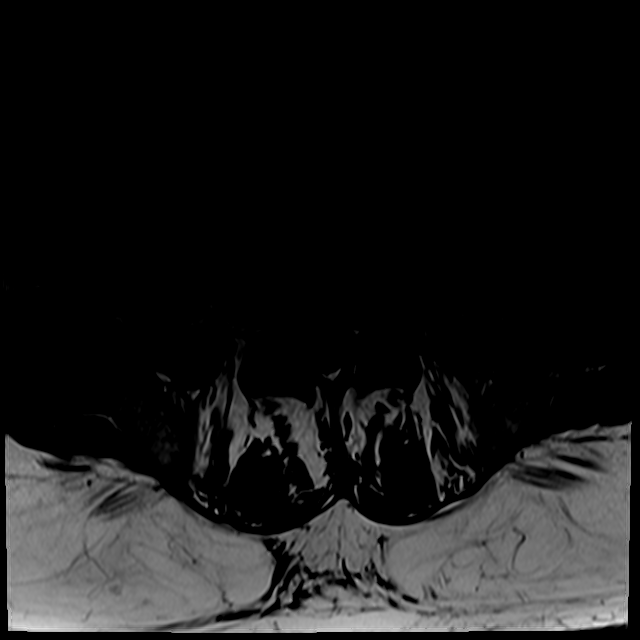
[im 12/40]
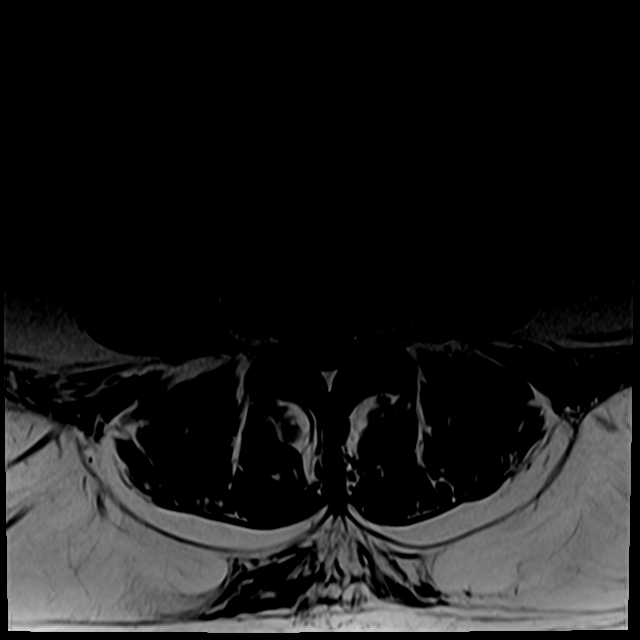
[im 20/40]
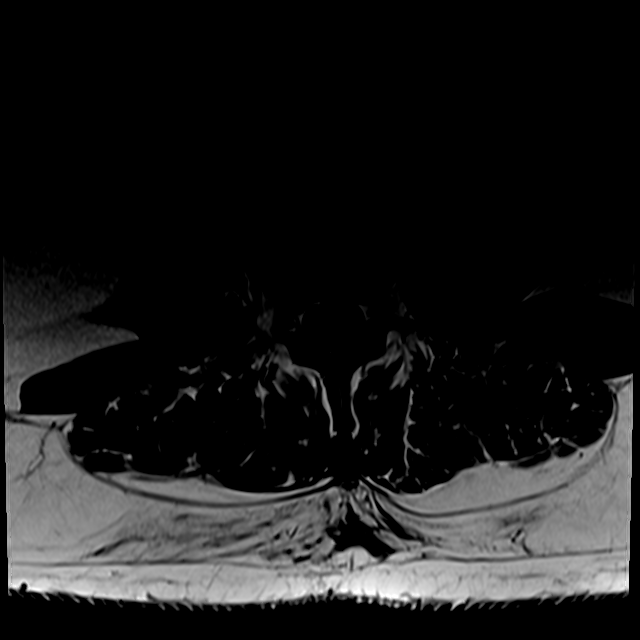
[im 34/40]
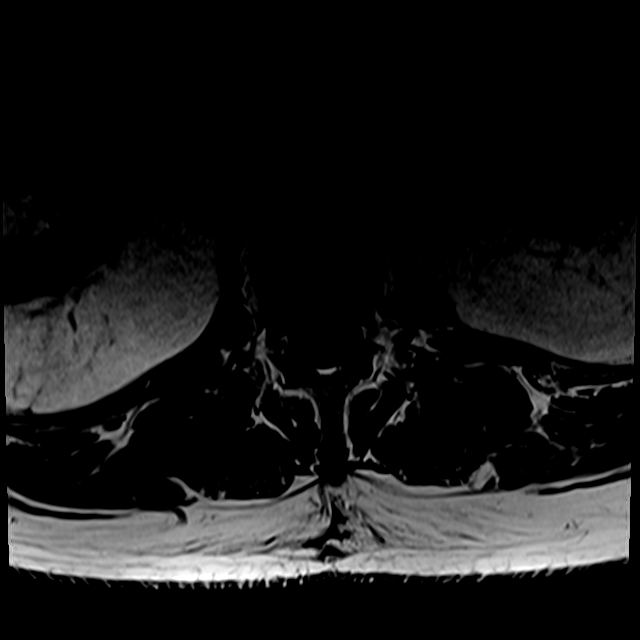

[26 of 48 positions shown; findings below may reference images not displayed]

FINDINGS: Segmentation: Normal in 7000, the same numbering system used on the
MRI last year.

Alignment:  Stable straightening of lordosis.

Vertebrae: No marrow edema or evidence of acute osseous abnormality.
Mild chronic L3 superior endplate compression is stable, with some
associated degenerative endplate marrow signal changes. There is
chronic interbody ankylosis at L1-L2 related to flowing anterior
endplate osteophyte (series 6, image 10). Normal background bone
marrow signal. Intact visible sacrum and SI joints.

Conus medullaris and cauda equina: Conus extends to the T12-L1
level. No lower spinal cord or conus signal abnormality. Normal
cauda equina nerve roots.

Paraspinal and other soft tissues: Negative.

Disc levels:

T12-L1:  Mild posterior disc bulging. No stenosis.

L1-L2:  Interbody ankylosis. No stenosis.

L2-L3: Chronic disc desiccation with circumferential but mostly
anterior disc bulging. Mild endplate spurring. Asymmetric right
foraminal component of disc, but no significant stenosis.

L3-L4:  Minimal disc bulging and endplate spurring. No stenosis.

L4-L5: Chronic disc desiccation. Mild circumferential disc bulge
with endplate spurring. Broad-based posterior component. Mild facet
and ligament flavum hypertrophy greater on the left. Stable
borderline to mild spinal stenosis (series 5, image 9). No
convincing lateral recess stenosis. Stable mild left L4 foraminal
stenosis.

L5-S1: Disc desiccation. Circumferential disc bulge and endplate
spurring with broad-based central component posteriorly. Mild facet
and ligament flavum hypertrophy. No spinal stenosis. Small right
lateral recess annular fissure of the disc redemonstrated on series
5, image 7. Up to mild bilateral lateral recess stenosis (descending
S1 nerve levels) and bilateral L5 neural foraminal stenosis is
stable.
IMPRESSION: 1. Stable MRI appearance of the lumbar spine since last year.
Chronic interbody ankylosis at L1-L2, and mild chronic L3 superior
endplate compression.

2. Stable borderline to mild multifactorial spinal stenosis at
L4-L5, and up to mild bilateral lateral recess and foraminal
stenosis at L5-S1.

## 2022-04-07 ENCOUNTER — Encounter: Payer: Self-pay | Admitting: Emergency Medicine

## 2022-04-07 ENCOUNTER — Emergency Department
Admission: EM | Admit: 2022-04-07 | Discharge: 2022-04-07 | Discharge status: Against Medical Advice | Payer: Medicare PPO | Attending: Emergency Medicine | Admitting: Emergency Medicine

## 2022-04-07 ENCOUNTER — Emergency Department: Payer: Medicare PPO

## 2022-04-07 ENCOUNTER — Other Ambulatory Visit: Payer: Self-pay

## 2022-04-07 DIAGNOSIS — Z7982 Long term (current) use of aspirin: Secondary | ICD-10-CM | POA: Diagnosis not present

## 2022-04-07 DIAGNOSIS — R0789 Other chest pain: Secondary | ICD-10-CM | POA: Diagnosis present

## 2022-04-07 DIAGNOSIS — F410 Panic disorder [episodic paroxysmal anxiety] without agoraphobia: Secondary | ICD-10-CM | POA: Insufficient documentation

## 2022-04-07 DIAGNOSIS — Z5321 Procedure and treatment not carried out due to patient leaving prior to being seen by health care provider: Secondary | ICD-10-CM | POA: Insufficient documentation

## 2022-04-07 LAB — TROPONIN I (HIGH SENSITIVITY): Troponin I (High Sensitivity): 4 ng/L (ref <18)

## 2022-04-07 LAB — CBC
HCT: 41 % (ref 39.0–52.0)
Hemoglobin: 13.9 g/dL (ref 13.0–17.0)
MCH: 32.1 pg (ref 26.0–34.0)
MCHC: 33.9 g/dL (ref 30.0–36.0)
MCV: 94.7 fL (ref 80.0–100.0)
Platelets: 165 10*3/uL (ref 150–400)
RBC: 4.33 MIL/uL (ref 4.22–5.81)
RDW: 13.2 % (ref 11.5–15.5)
WBC: 6.9 10*3/uL (ref 4.0–10.5)
nRBC: 0 % (ref 0.0–0.2)

## 2022-04-07 NOTE — ED Triage Notes (Signed)
Pt in via from home with c/o  Pt with hx of parkinson's, was driving and started with chest pressure to center of his chest. Pt with inch of paste on and has been given '324mg'$  of asa

## 2022-04-07 NOTE — ED Triage Notes (Signed)
Pt reports that he feels anxious and "I have panic attacks."

## 2022-04-07 NOTE — ED Provider Triage Note (Signed)
  Emergency Medicine Provider Triage Evaluation Note  Gabriel Dillon , a 66 y.o.male,  was evaluated in triage.  Pt complains of chest pain and anxiety that started earlier today approximately 2 to 3 hours ago.  Patient states that he was driving when he felt the chest pressure.  He was treated with aspirin and nitroglycerin.    Review of Systems  Positive: Chest pain Negative: Denies fever, chest pain, vomiting  Physical Exam   Vitals:   04/07/22 1635  BP: (!) 147/73  Pulse: 79  Resp: 15  Temp: (!) 97.5 F (36.4 C)  SpO2: 96%   Gen:   Awake, no distress   Resp:  Normal effort  MSK:   Moves extremities without difficulty  Other:    Medical Decision Making  Given the patient's initial medical screening exam, the following diagnostic evaluation has been ordered. The patient will be placed in the appropriate treatment space, once one is available, to complete the evaluation and treatment. I have discussed the plan of care with the patient and I have advised the patient that an ED physician or mid-level practitioner will reevaluate their condition after the test results have been received, as the results may give them additional insight into the type of treatment they may need.    Diagnostics: Labs, EKG, CXR  Treatments: none immediately   Teodoro Spray, [place] 04/07/22 1718

## 2022-04-18 ENCOUNTER — Other Ambulatory Visit: Payer: Self-pay | Admitting: Pulmonary Disease

## 2022-04-18 ENCOUNTER — Other Ambulatory Visit
Admission: RE | Admit: 2022-04-18 | Discharge: 2022-04-18 | Disposition: A | Discharge status: Home / Self Care | Payer: Medicare PPO | Source: Ambulatory Visit | Attending: Pulmonary Disease | Admitting: Pulmonary Disease

## 2022-04-18 DIAGNOSIS — R0602 Shortness of breath: Secondary | ICD-10-CM

## 2022-04-18 DIAGNOSIS — Z9981 Dependence on supplemental oxygen: Secondary | ICD-10-CM | POA: Diagnosis present

## 2022-04-18 DIAGNOSIS — R0902 Hypoxemia: Secondary | ICD-10-CM

## 2022-04-18 DIAGNOSIS — R069 Unspecified abnormalities of breathing: Secondary | ICD-10-CM | POA: Diagnosis present

## 2022-04-18 DIAGNOSIS — R0609 Other forms of dyspnea: Secondary | ICD-10-CM

## 2022-04-18 LAB — D-DIMER, QUANTITATIVE: D-Dimer, Quant: 0.8 ug/mL-FEU — ABNORMAL HIGH (ref 0.00–0.50)

## 2022-04-19 ENCOUNTER — Other Ambulatory Visit: Payer: Self-pay | Admitting: Pulmonary Disease

## 2022-04-19 DIAGNOSIS — R0602 Shortness of breath: Secondary | ICD-10-CM

## 2022-04-19 DIAGNOSIS — R0902 Hypoxemia: Secondary | ICD-10-CM

## 2022-04-23 ENCOUNTER — Ambulatory Visit
Admission: RE | Admit: 2022-04-23 | Discharge: 2022-04-23 | Disposition: A | Discharge status: Home / Self Care | Payer: Medicare PPO | Source: Ambulatory Visit | Attending: Pulmonary Disease | Admitting: Pulmonary Disease

## 2022-04-23 DIAGNOSIS — R0609 Other forms of dyspnea: Secondary | ICD-10-CM

## 2022-04-23 DIAGNOSIS — R0602 Shortness of breath: Secondary | ICD-10-CM

## 2022-04-23 DIAGNOSIS — R0902 Hypoxemia: Secondary | ICD-10-CM

## 2022-05-02 ENCOUNTER — Ambulatory Visit
Admission: RE | Admit: 2022-05-02 | Discharge: 2022-05-02 | Disposition: A | Discharge status: Home / Self Care | Payer: Medicare PPO | Source: Ambulatory Visit | Attending: Pulmonary Disease | Admitting: Pulmonary Disease

## 2022-05-02 DIAGNOSIS — R0902 Hypoxemia: Secondary | ICD-10-CM

## 2022-05-02 DIAGNOSIS — R0602 Shortness of breath: Secondary | ICD-10-CM

## 2022-05-02 MED ORDER — IOPAMIDOL (ISOVUE-370) INJECTION 76%
75.0000 mL | Freq: Once | INTRAVENOUS | Status: AC | PRN
  Administered 2022-05-02: 75 mL via INTRAVENOUS

## 2022-10-07 ENCOUNTER — Ambulatory Visit
Admission: EM | Admit: 2022-10-07 | Discharge: 2022-10-07 | Disposition: A | Discharge status: Home / Self Care | Payer: Medicare PPO | Attending: Emergency Medicine | Admitting: Emergency Medicine

## 2022-10-07 ENCOUNTER — Other Ambulatory Visit: Payer: Self-pay

## 2022-10-07 DIAGNOSIS — S61212A Laceration without foreign body of right middle finger without damage to nail, initial encounter: Secondary | ICD-10-CM

## 2022-10-07 MED ORDER — DOXYCYCLINE HYCLATE 100 MG PO CAPS: 100.0000 mg | ORAL_CAPSULE | Freq: Two times a day (BID) | ORAL | 0 refills | Status: AC

## 2022-10-07 MED ORDER — TETANUS-DIPHTH-ACELL PERTUSSIS 5-2.5-18.5 LF-MCG/0.5 IM SUSY
0.5000 mL | PREFILLED_SYRINGE | Freq: Once | INTRAMUSCULAR | Status: AC
Start: 2022-10-07 — End: 2022-10-07
  Administered 2022-10-07: 0.5 mL via INTRAMUSCULAR

## 2022-10-07 NOTE — ED Triage Notes (Signed)
Pt states he cut his knuckle on his own pocket knife

## 2022-10-07 NOTE — Discharge Instructions (Addendum)
Keep the wound on your right finger clean and dry.  Leave the Steri-Strips in place and let them fall off on their own.  This will generally happen in about 5 to 7 days.  Take the doxycycline twice daily with food for 5 days to prevent infection from your laceration.  If you develop any swelling, redness, pain, drainage, red streaks going up your finger, or fever you need to return for reevaluation.

## 2022-10-07 NOTE — ED Provider Notes (Signed)
[facility]    CSN: [id number] Arrival date & time: 10/07/22  1500      History   Chief Complaint Chief Complaint  Patient presents with   Laceration    Sustained laceration to right 3rd digit from his pocket knife    HPI Gabriel Dillon is a 67 y.o. male.   HPI  67 year old male here for evaluation of finger laceration.  The patient was working out in his workshop and cut the first knuckle of his right middle finger with his pocket knife earlier today.  There is no active bleeding.  The patient does have Parkinson's.  He is unsure when his last tetanus shot was.  He denies any numbness or tingling in his finger.  Past Medical History:  Diagnosis Date   Allergic rhinitis    Back pain    BPH (benign prostatic hyperplasia)    Enlarged prostate    Fatty liver    Gout    Lumbar degenerative disc disease    Nephrolithiasis    Neuromuscular disorder (HCC)    Palpitation    Panic attacks    Panic attacks    Parkinson's disease    Sleep apnea    Urge incontinence     Patient Active Problem List   Diagnosis Date Noted   Noncompliance with medication regimen 05/02/2020   High risk medication use 03/21/2020   Lumbar stenosis without neurogenic claudication 03/13/2020   SOB (shortness of breath) on exertion 11/30/2019   Weakness 10/26/2019   Shuffling gait 10/26/2019   OSA (obstructive sleep apnea) 10/26/2019   MCI (mild cognitive impairment) 10/26/2019   Impaired glucose tolerance 09/30/2019   Lymphedema of both lower extremities 09/22/2019   Hyperlipidemia, mixed 09/22/2019   Loss of memory 09/21/2019   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/21/2019   Sleep difficulties 03/16/2019   Anxiety, generalized 02/24/2019   GAD (generalized anxiety disorder) 02/24/2019   Insomnia 02/24/2019   Chronic left-sided low back pain with bilateral sciatica 09/18/2018   Encounter for medication management 09/18/2018   Anxiousness 09/03/2018   BMI 40.0-44.9, adult  ([place]) 06/23/2018   History of anxiety 05/07/2018   Sleep disorder 05/07/2018   Restless leg syndrome 02/26/2018   Constipation 06/26/2017   Resting tremor 06/26/2017   Parkinson's disease 04/24/2017   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   Fatty liver 07/10/2016   B12 deficiency 04/08/2016   Chest pain 11/17/2015   Allergic rhinitis 08/16/2014   Benign prostatic hyperplasia 08/16/2014   Panic disorder 08/16/2014   Lumbar degenerative disc disease 08/16/2014   Generalized anxiety disorder 08/16/2014   Calculus of kidney 06/29/2014   Frequency of micturition 06/29/2014   Right lower quadrant pain 06/29/2014   Pelvic and perineal pain 04/06/2013   Nocturia 04/06/2013   Male pelvic pain 04/06/2013   Urinary urgency 04/06/2013   Urge incontinence 04/06/2013   Slowing of urinary stream 04/06/2013   Family history of malignant neoplasm 03/13/2012   Prostatitis 02/07/2012    Past Surgical History:  Procedure Laterality Date   APPENDECTOMY     CATARACT EXTRACTION W/PHACO Left 10/03/2021   Procedure: CATARACT EXTRACTION PHACO AND INTRAOCULAR LENS PLACEMENT ([place]) LEFT;  Surgeon: Leandrew Koyanagi, MD;  Location: [place];  Service: Ophthalmology;  Laterality: Left;  5.89 00:28.9   CATARACT EXTRACTION W/PHACO Right 10/17/2021   Procedure: CATARACT EXTRACTION PHACO AND INTRAOCULAR LENS PLACEMENT ([place]) RIGHT;  Surgeon: Norvel Richards, MD;  Location: [place];  Service: Ophthalmology;  Laterality: Right;  3.20 00:31.0   COLONOSCOPY WITH PROPOFOL N/A 10/13/2018   Procedure: COLONOSCOPY WITH PROPOFOL;  Surgeon: Lollie Sails, MD;  Location: [facility] ENDOSCOPY;  Service: Endoscopy;  Laterality: N/A;   COLONOSCOPY WITH PROPOFOL N/A 04/25/2021   Procedure: COLONOSCOPY WITH PROPOFOL;  Surgeon: Robert Bellow, MD;  Location: [facility] ENDOSCOPY;  Service: Endoscopy;  Laterality: N/A;   TONSILLECTOMY         Home Medications    Prior to  Admission medications   Medication Sig Start Date End Date Taking? Authorizing Provider  doxycycline (VIBRAMYCIN) 100 MG capsule Take 1 capsule (100 mg total) by mouth 2 (two) times daily for 5 days. 10/07/22 10/12/22 Yes Margarette Canada, NP  acetaminophen (TYLENOL) 500 MG tablet Take 1-2 tablets by mouth every 6 (six) hours as needed.    [provider]  carbidopa-levodopa (SINEMET CR) 50-200 MG tablet Take 1 tablet by mouth daily. 04/11/20   [provider]  carbidopa-levodopa (SINEMET IR) 25-250 MG tablet Take 1 tablet by mouth 3 (three) times daily.    [provider]  carbidopa-levodopa (SINEMET IR) 25-250 MG tablet Take by mouth. Patient not taking: Reported on 04/25/2021 03/24/20   [provider]  clonazePAM (KLONOPIN) 1 MG tablet Take 1 mg three times a day Patient not taking: Reported on 04/25/2021 10/26/19   [provider]  clonazePAM (KLONOPIN) 1 MG tablet Take 1 tablet by mouth 3 (three) times daily. 02/14/20   [provider]  cyclobenzaprine (FLEXERIL) 5 MG tablet Take 5 mg by mouth 3 (three) times daily as needed for muscle spasms. Patient not taking: Reported on 09/26/2021    [provider]  docusate sodium (COLACE) 100 MG capsule Take 100 mg by mouth 2 (two) times daily.    [provider]  donepezil (ARICEPT) 5 MG tablet Take 5 mg nightly 09/15/19   [provider]  escitalopram (LEXAPRO) 10 MG tablet Take 10 mg by mouth daily. Patient not taking: Reported on 09/26/2021    [provider]  furosemide (LASIX) 20 MG tablet Take by mouth. 11/26/19 10/17/21  [provider]  gabapentin (NEURONTIN) 300 MG capsule Take 300 mg in the morning and 300 mg in the afternoon and 900 mg at night 04/11/20   [provider]  gabapentin (NEURONTIN) 600 MG tablet Take 600 mg by mouth at bedtime.    [provider]  hydrOXYzine (VISTARIL) 25 MG capsule Take 1 capsule (25 mg total) by mouth at bedtime as  needed. For sleep 04/16/19   Ursula Alert, MD  ketoconazole (NIZORAL) 2 % shampoo APPLY TOPICALLY ONCE AND LEAVE ON FOR 5 MINUTES THEN RINSE. THEN USE TWICE WEEKLY FOR 4 WEEKS, THEN AS NEEDED 04/13/19   [provider]  meloxicam (MOBIC) 7.5 MG tablet Take 7.5 mg by mouth daily.    [provider]  oxyCODONE-acetaminophen (PERCOCET/ROXICET) 5-325 MG tablet Take by mouth every 4 (four) hours as needed for severe pain. Patient not taking: Reported on 09/26/2021    [provider]  Polyethylene Glycol 3350 (PEG 3350) 17 GM/SCOOP POWD Take by mouth.    [provider]  propranolol (INDERAL) 10 MG tablet Take 1 tablet (10 mg total) by mouth 3 (three) times daily as needed. For severe panic attacks 04/16/19   Ursula Alert, MD  rOPINIRole (REQUIP) 2 MG tablet TAKE ONE TABLET BY MOUTH ONCE NIGHTLY 10/25/19   [provider]  vitamin B-12 (CYANOCOBALAMIN) 1000 MCG tablet Take by mouth.    [provider]  Family History Family History  Problem Relation Age of Onset   Mental illness Neg Hx     Social History Social History   Tobacco Use   Smoking status: Former    Packs/day: 2.00    Years: 10.00    Total pack years: 20.00    Types: Cigarettes    Quit date: 10/17/1992    Years since quitting: 29.9   Smokeless tobacco: Never  Vaping Use   Vaping Use: Never used  Substance Use Topics   Alcohol use: No   Drug use: No     Allergies   Ibuprofen, Penicillins, Prednisone, Citalopram, Valproic acid, and Aspirin   Review of Systems Review of Systems  Skin:  Positive for wound.  Neurological:  Negative for weakness and numbness.     Physical Exam Triage Vital Signs ED Triage Vitals  Enc Vitals Group     BP 10/07/22 1607 (!) 141/99     Pulse Rate 10/07/22 1607 85     Resp 10/07/22 1607 (!) 22     Temp 10/07/22 1607 98.2 F (36.8 C)     Temp src --      SpO2 10/07/22 1607 95 %     Weight --      Height --      Head  Circumference --      Peak Flow --      Pain Score 10/07/22 1603 7     Pain Loc --      Pain Edu? --      Excl. in [place]? --    No data found.  Updated Vital Signs BP (!) 141/99   Pulse 85   Temp 98.2 F (36.8 C)   Resp (!) 22   SpO2 95%   Visual Acuity Right Eye Distance:   Left Eye Distance:   Bilateral Distance:    Right Eye Near:   Left Eye Near:    Bilateral Near:     Physical Exam Vitals and nursing note reviewed.  Constitutional:      Appearance: Normal appearance.  Musculoskeletal:        General: Signs of injury present.  Skin:    General: Skin is warm and dry.     Capillary Refill: Capillary refill takes less than 2 seconds.     Findings: No bruising or erythema.  Neurological:     General: No focal deficit present.     Mental Status: He is alert and oriented to person, place, and time.  Psychiatric:        Mood and Affect: Mood normal.        Behavior: Behavior normal.        Thought Content: Thought content normal.        Judgment: Judgment normal.      UC Treatments / Results  Labs (all labs ordered are listed, but only abnormal results are displayed) Labs Reviewed - No data to display  EKG   Radiology No results found.  Procedures Procedures (including critical care time)  Medications Ordered in UC Medications  Tdap (BOOSTRIX) injection 0.5 mL (has no administration in time range)    Initial Impression / Assessment and Plan / UC Course  I have reviewed the triage vital signs and the nursing notes.  Pertinent labs & imaging results that were available during my care of the patient were reviewed by me and considered in my medical decision making (see chart for details).   Pleasant, nontoxic-appearing 67 year old male here for evaluation of  a laceration to the dorsal aspect of the PIP joint of the right middle finger that happened earlier today.  The laceration is approximately 1/2 cm and the edges are well-approximated and it does not  gape open.  I cleansed the wound with alcohol and secured the laceration with 3 quarter-inch Steri-Strips and benzoin solution.  I will place the patient on doxycycline 100 mg twice daily for 5 days to prevent infection and ordered update of his tetanus shot.  I have advised him to leave the Steri-Strips alone and let them fall off on their own.    Final Clinical Impressions(s) / UC Diagnoses   Final diagnoses:  Laceration of right middle finger without foreign body without damage to nail, initial encounter     Discharge Instructions      Keep the wound on your right finger clean and dry.  Leave the Steri-Strips in place and let them fall off on their own.  This will generally happen in about 5 to 7 days.  Take the doxycycline twice daily with food for 5 days to prevent infection from your laceration.  If you develop any swelling, redness, pain, drainage, red streaks going up your finger, or fever you need to return for reevaluation.     ED Prescriptions     Medication Sig Dispense Auth. Provider   doxycycline (VIBRAMYCIN) 100 MG capsule Take 1 capsule (100 mg total) by mouth 2 (two) times daily for 5 days. 10 capsule Margarette Canada, NP      PDMP not reviewed this encounter.   Margarette Canada, NP 10/07/22 1626

## 2023-04-03 ENCOUNTER — Emergency Department (HOSPITAL_COMMUNITY): Payer: Medicare PPO

## 2023-04-03 ENCOUNTER — Other Ambulatory Visit: Payer: Self-pay

## 2023-04-03 ENCOUNTER — Inpatient Hospital Stay (HOSPITAL_COMMUNITY)
Admission: EM | Admit: 2023-04-03 | Discharge: 2023-04-05 | DRG: 312 | Disposition: A | Discharge status: Home / Self Care | Payer: Medicare PPO | Attending: Internal Medicine | Admitting: Internal Medicine

## 2023-04-03 DIAGNOSIS — R296 Repeated falls: Secondary | ICD-10-CM | POA: Diagnosis present

## 2023-04-03 DIAGNOSIS — F458 Other somatoform disorders: Secondary | ICD-10-CM | POA: Diagnosis present

## 2023-04-03 DIAGNOSIS — S069X9A Unspecified intracranial injury with loss of consciousness of unspecified duration, initial encounter: Principal | ICD-10-CM

## 2023-04-03 DIAGNOSIS — G2581 Restless legs syndrome: Secondary | ICD-10-CM | POA: Diagnosis present

## 2023-04-03 DIAGNOSIS — R55 Syncope and collapse: Secondary | ICD-10-CM | POA: Diagnosis present

## 2023-04-03 DIAGNOSIS — M545 Low back pain, unspecified: Secondary | ICD-10-CM | POA: Diagnosis present

## 2023-04-03 DIAGNOSIS — E860 Dehydration: Secondary | ICD-10-CM | POA: Diagnosis present

## 2023-04-03 DIAGNOSIS — S0181XA Laceration without foreign body of other part of head, initial encounter: Secondary | ICD-10-CM | POA: Diagnosis present

## 2023-04-03 DIAGNOSIS — M47812 Spondylosis without myelopathy or radiculopathy, cervical region: Secondary | ICD-10-CM | POA: Diagnosis present

## 2023-04-03 DIAGNOSIS — M4802 Spinal stenosis, cervical region: Secondary | ICD-10-CM | POA: Diagnosis present

## 2023-04-03 DIAGNOSIS — Z8249 Family history of ischemic heart disease and other diseases of the circulatory system: Secondary | ICD-10-CM

## 2023-04-03 DIAGNOSIS — Z79899 Other long term (current) drug therapy: Secondary | ICD-10-CM

## 2023-04-03 DIAGNOSIS — N179 Acute kidney failure, unspecified: Secondary | ICD-10-CM | POA: Diagnosis present

## 2023-04-03 DIAGNOSIS — Z981 Arthrodesis status: Secondary | ICD-10-CM

## 2023-04-03 DIAGNOSIS — Z820 Family history of epilepsy and other diseases of the nervous system: Secondary | ICD-10-CM

## 2023-04-03 DIAGNOSIS — F419 Anxiety disorder, unspecified: Secondary | ICD-10-CM | POA: Diagnosis present

## 2023-04-03 DIAGNOSIS — G9349 Other encephalopathy: Secondary | ICD-10-CM | POA: Diagnosis present

## 2023-04-03 DIAGNOSIS — G20A1 Parkinson's disease without dyskinesia, without mention of fluctuations: Secondary | ICD-10-CM | POA: Diagnosis present

## 2023-04-03 DIAGNOSIS — N289 Disorder of kidney and ureter, unspecified: Secondary | ICD-10-CM

## 2023-04-03 DIAGNOSIS — I951 Orthostatic hypotension: Secondary | ICD-10-CM | POA: Diagnosis not present

## 2023-04-03 DIAGNOSIS — W19XXXA Unspecified fall, initial encounter: Secondary | ICD-10-CM | POA: Diagnosis present

## 2023-04-03 DIAGNOSIS — G894 Chronic pain syndrome: Secondary | ICD-10-CM | POA: Diagnosis present

## 2023-04-03 DIAGNOSIS — G479 Sleep disorder, unspecified: Secondary | ICD-10-CM | POA: Diagnosis present

## 2023-04-03 DIAGNOSIS — Z8669 Personal history of other diseases of the nervous system and sense organs: Secondary | ICD-10-CM

## 2023-04-03 DIAGNOSIS — T50995A Adverse effect of other drugs, medicaments and biological substances, initial encounter: Secondary | ICD-10-CM | POA: Diagnosis present

## 2023-04-03 DIAGNOSIS — Z791 Long term (current) use of non-steroidal anti-inflammatories (NSAID): Secondary | ICD-10-CM

## 2023-04-03 LAB — CBC WITH DIFFERENTIAL/PLATELET
Abs Immature Granulocytes: 0.01 10*3/uL (ref 0.00–0.07)
Basophils Absolute: 0 10*3/uL (ref 0.0–0.1)
Basophils Relative: 1 %
Eosinophils Absolute: 0.1 10*3/uL (ref 0.0–0.5)
Eosinophils Relative: 2 %
HCT: 37.2 % — ABNORMAL LOW (ref 39.0–52.0)
Hemoglobin: 12.1 g/dL — ABNORMAL LOW (ref 13.0–17.0)
Immature Granulocytes: 0 %
Lymphocytes Relative: 19 %
Lymphs Abs: 1 10*3/uL (ref 0.7–4.0)
MCH: 32.6 pg (ref 26.0–34.0)
MCHC: 32.5 g/dL (ref 30.0–36.0)
MCV: 100.3 fL — ABNORMAL HIGH (ref 80.0–100.0)
Monocytes Absolute: 0.5 10*3/uL (ref 0.1–1.0)
Monocytes Relative: 9 %
Neutro Abs: 3.6 10*3/uL (ref 1.7–7.7)
Neutrophils Relative %: 69 %
Platelets: 141 10*3/uL — ABNORMAL LOW (ref 150–400)
RBC: 3.71 MIL/uL — ABNORMAL LOW (ref 4.22–5.81)
RDW: 13.6 % (ref 11.5–15.5)
WBC: 5.2 10*3/uL (ref 4.0–10.5)
nRBC: 0 % (ref 0.0–0.2)

## 2023-04-03 LAB — URINALYSIS, ROUTINE W REFLEX MICROSCOPIC
Bilirubin Urine: NEGATIVE
Glucose, UA: NEGATIVE mg/dL
Hgb urine dipstick: NEGATIVE
Ketones, ur: 5 mg/dL — AB
Leukocytes,Ua: NEGATIVE
Nitrite: NEGATIVE
Protein, ur: NEGATIVE mg/dL
Specific Gravity, Urine: 1.01 (ref 1.005–1.030)
pH: 6 (ref 5.0–8.0)

## 2023-04-03 LAB — BASIC METABOLIC PANEL
Anion gap: 9 (ref 5–15)
BUN: 18 mg/dL (ref 8–23)
CO2: 27 mmol/L (ref 22–32)
Calcium: 9 mg/dL (ref 8.9–10.3)
Chloride: 103 mmol/L (ref 98–111)
Creatinine, Ser: 1.3 mg/dL — ABNORMAL HIGH (ref 0.61–1.24)
GFR, Estimated: >60 mL/min (ref >60)
Glucose, Bld: 96 mg/dL (ref 70–99)
Potassium: 5.2 mmol/L — ABNORMAL HIGH (ref 3.5–5.1)
Sodium: 139 mmol/L (ref 135–145)

## 2023-04-03 LAB — TROPONIN I (HIGH SENSITIVITY): Troponin I (High Sensitivity): 3 ng/L (ref <18)

## 2023-04-03 MED ORDER — DONEPEZIL HCL 10 MG PO TABS
5.0000 mg | ORAL_TABLET | Freq: Every day | ORAL | Status: DC
  Administered 2023-04-04: 5 mg via ORAL
  Filled 2023-04-03: qty 1

## 2023-04-03 MED ORDER — CARBIDOPA-LEVODOPA 25-250 MG PO TABS
2.0000 | ORAL_TABLET | Freq: Every day | ORAL | Status: DC
  Administered 2023-04-03 – 2023-04-05 (×12): 2 via ORAL
  Filled 2023-04-03 (×15): qty 2

## 2023-04-03 MED ORDER — ACETAMINOPHEN 325 MG PO TABS
650.0000 mg | ORAL_TABLET | Freq: Four times a day (QID) | ORAL | Status: DC | PRN
  Administered 2023-04-04: 650 mg via ORAL
  Filled 2023-04-03: qty 2

## 2023-04-03 MED ORDER — OXYCODONE HCL 5 MG PO TABS
5.0000 mg | ORAL_TABLET | Freq: Four times a day (QID) | ORAL | Status: DC | PRN
  Administered 2023-04-03: 5 mg via ORAL
  Filled 2023-04-03: qty 1

## 2023-04-03 MED ORDER — ACETAMINOPHEN 650 MG RE SUPP: 650.0000 mg | Freq: Four times a day (QID) | RECTAL | Status: DC | PRN

## 2023-04-03 MED ORDER — ENOXAPARIN SODIUM 40 MG/0.4ML IJ SOSY
40.0000 mg | PREFILLED_SYRINGE | INTRAMUSCULAR | Status: DC
  Administered 2023-04-03: 40 mg via SUBCUTANEOUS
  Filled 2023-04-03: qty 0.4

## 2023-04-03 MED ORDER — DONEPEZIL HCL 10 MG PO TABS: 5.0000 mg | ORAL_TABLET | Freq: Every day | ORAL | Status: DC

## 2023-04-03 MED ORDER — GABAPENTIN 300 MG PO CAPS
600.0000 mg | ORAL_CAPSULE | Freq: Two times a day (BID) | ORAL | Status: DC
  Administered 2023-04-04 – 2023-04-05 (×4): 600 mg via ORAL
  Filled 2023-04-03 (×4): qty 2

## 2023-04-03 MED ORDER — ROPINIROLE HCL 1 MG PO TABS
2.0000 mg | ORAL_TABLET | Freq: Every day | ORAL | Status: DC
  Administered 2023-04-03 – 2023-04-04 (×2): 2 mg via ORAL
  Filled 2023-04-03 (×2): qty 2

## 2023-04-03 MED ORDER — FENTANYL CITRATE PF 50 MCG/ML IJ SOSY
40.0000 ug | PREFILLED_SYRINGE | Freq: Once | INTRAMUSCULAR | Status: AC
  Administered 2023-04-03: 40 ug via INTRAVENOUS
  Filled 2023-04-03: qty 1

## 2023-04-03 MED ORDER — SODIUM CHLORIDE 0.9 % IV BOLUS
500.0000 mL | Freq: Once | INTRAVENOUS | Status: AC
  Administered 2023-04-03: 500 mL via INTRAVENOUS

## 2023-04-03 MED ORDER — GABAPENTIN 300 MG PO CAPS
600.0000 mg | ORAL_CAPSULE | Freq: Every day | ORAL | Status: DC
  Administered 2023-04-03 – 2023-04-04 (×2): 600 mg via ORAL
  Filled 2023-04-03 (×2): qty 2

## 2023-04-03 MED ORDER — TRAMADOL HCL 50 MG PO TABS
50.0000 mg | ORAL_TABLET | Freq: Two times a day (BID) | ORAL | Status: DC | PRN
  Administered 2023-04-03 – 2023-04-04 (×2): 50 mg via ORAL
  Filled 2023-04-03 (×2): qty 1

## 2023-04-03 MED ORDER — QUETIAPINE FUMARATE 100 MG PO TABS
100.0000 mg | ORAL_TABLET | Freq: Every day | ORAL | Status: DC
  Administered 2023-04-03 – 2023-04-04 (×2): 100 mg via ORAL
  Filled 2023-04-03 (×2): qty 1

## 2023-04-03 MED ORDER — FENTANYL CITRATE PF 50 MCG/ML IJ SOSY
50.0000 ug | PREFILLED_SYRINGE | INTRAMUSCULAR | Status: DC | PRN
  Administered 2023-04-03: 50 ug via INTRAVENOUS
  Filled 2023-04-03: qty 1

## 2023-04-03 MED ORDER — LIDOCAINE-EPINEPHRINE (PF) 2 %-1:200000 IJ SOLN
20.0000 mL | Freq: Once | INTRAMUSCULAR | Status: AC
  Administered 2023-04-03: 20 mL
  Filled 2023-04-03: qty 20

## 2023-04-03 MED ORDER — CLONAZEPAM 0.5 MG PO TABS
0.5000 mg | ORAL_TABLET | Freq: Every day | ORAL | Status: DC
  Administered 2023-04-03 – 2023-04-05 (×8): 0.5 mg via ORAL
  Filled 2023-04-03 (×8): qty 1

## 2023-04-03 NOTE — ED Provider Notes (Signed)
[place] EMERGENCY DEPARTMENT AT [facility] Provider Note   CSN: [id number] Arrival date & time: 04/03/23  1212     History  Chief Complaint  Patient presents with   Gabriel Dillon is a 67 y.o. male.  Patient presents for assessment after an request for parking lot despite arrival.  Patient is now causing him to fall admits to brief syncope.  No known seizures.  History of Parkinson's he tends to lean forward and is weaker on his walker.  Patient hit left frontal part of his head with bleeding and laceration.  No fevers, chest pain, abdominal pain or neck pain.   Fall Associated symptoms include headaches. Pertinent negatives include no chest pain, no abdominal pain and no shortness of breath.       Home Medications Prior to Admission medications   Medication Sig Start Date End Date Taking? Authorizing Provider  acetaminophen (TYLENOL) 500 MG tablet Take 1-2 tablets by mouth every 6 (six) hours as needed.    [provider]  carbidopa-levodopa (SINEMET CR) 50-200 MG tablet Take 1 tablet by mouth daily. 04/11/20   [provider]  carbidopa-levodopa (SINEMET IR) 25-250 MG tablet Take 1 tablet by mouth 3 (three) times daily.    [provider]  carbidopa-levodopa (SINEMET IR) 25-250 MG tablet Take by mouth. Patient not taking: Reported on 04/25/2021 03/24/20   [provider]  clonazePAM (KLONOPIN) 1 MG tablet Take 1 mg three times a day Patient not taking: Reported on 04/25/2021 10/26/19   [provider]  clonazePAM (KLONOPIN) 1 MG tablet Take 1 tablet by mouth 3 (three) times daily. 02/14/20   [provider]  cyclobenzaprine (FLEXERIL) 5 MG tablet Take 5 mg by mouth 3 (three) times daily as needed for muscle spasms. Patient not taking: Reported on 09/26/2021    [provider]  docusate sodium (COLACE) 100 MG capsule Take 100 mg by mouth 2 (two) times daily.    [provider]  donepezil  (ARICEPT) 5 MG tablet Take 5 mg nightly 09/15/19   [provider]  escitalopram (LEXAPRO) 10 MG tablet Take 10 mg by mouth daily. Patient not taking: Reported on 09/26/2021    [provider]  furosemide (LASIX) 20 MG tablet Take by mouth. 11/26/19 10/17/21  [provider]  gabapentin (NEURONTIN) 300 MG capsule Take 300 mg in the morning and 300 mg in the afternoon and 900 mg at night 04/11/20   [provider]  gabapentin (NEURONTIN) 600 MG tablet Take 600 mg by mouth at bedtime.    [provider]  hydrOXYzine (VISTARIL) 25 MG capsule Take 1 capsule (25 mg total) by mouth at bedtime as needed. For sleep 04/16/19   Jomarie Longs, MD  ketoconazole (NIZORAL) 2 % shampoo APPLY TOPICALLY ONCE AND LEAVE ON FOR 5 MINUTES THEN RINSE. THEN USE TWICE WEEKLY FOR 4 WEEKS, THEN AS NEEDED 04/13/19   [provider]  meloxicam (MOBIC) 7.5 MG tablet Take 7.5 mg by mouth daily.    [provider]  oxyCODONE-acetaminophen (PERCOCET/ROXICET) 5-325 MG tablet Take by mouth every 4 (four) hours as needed for severe pain. Patient not taking: Reported on 09/26/2021    [provider]  Polyethylene Glycol 3350 (PEG 3350) 17 GM/SCOOP POWD Take by mouth.    [provider]  propranolol (INDERAL) 10 MG tablet Take 1 tablet (10 mg total) by mouth 3 (three) times daily as needed. For severe panic attacks 04/16/19  Jomarie Longs, MD  rOPINIRole (REQUIP) 2 MG tablet TAKE ONE TABLET BY MOUTH ONCE NIGHTLY 10/25/19   [provider]  vitamin B-12 (CYANOCOBALAMIN) 1000 MCG tablet Take by mouth.    [provider]      Allergies    Ibuprofen, Penicillins, Prednisone, Citalopram, Valproic acid, and Aspirin    Review of Systems   Review of Systems  Constitutional:  Negative for chills and fever.  HENT:  Negative for congestion.   Eyes:  Negative for visual disturbance.  Respiratory:  Negative for shortness of breath.   Cardiovascular:   Negative for chest pain.  Gastrointestinal:  Negative for abdominal pain and vomiting.  Genitourinary:  Negative for dysuria and flank pain.  Musculoskeletal:  Negative for back pain, neck pain and neck stiffness.  Skin:  Positive for wound. Negative for rash.  Neurological:  Positive for weakness and headaches. Negative for light-headedness.    Physical Exam Updated Vital Signs BP (!) 155/83   Pulse (!) 59   Temp 97.7 F (36.5 C) (Oral)   Resp 20   Ht 5\' 8"  (1.727 m)   Wt 117.9 kg   SpO2 98%   BMI 39.53 kg/m  Physical Exam Vitals and nursing note reviewed.  Constitutional:      General: He is not in acute distress.    Appearance: He is well-developed.  HENT:     Head: Normocephalic.     Comments: Patient has 4 cm laceration gaping mild bleeding above the left eyebrow forehead area.    Mouth/Throat:     Mouth: Mucous membranes are moist.  Eyes:     General:        Right eye: No discharge.        Left eye: No discharge.     Conjunctiva/sclera: Conjunctivae normal.  Neck:     Trachea: No tracheal deviation.  Cardiovascular:     Rate and Rhythm: Normal rate and regular rhythm.     Heart sounds: No murmur heard. Pulmonary:     Effort: Pulmonary effort is normal.     Breath sounds: Normal breath sounds.  Abdominal:     General: There is no distension.     Palpations: Abdomen is soft.     Tenderness: There is no abdominal tenderness. There is no guarding.  Musculoskeletal:     Cervical back: Normal range of motion and neck supple. No rigidity.  Skin:    General: Skin is warm.     Capillary Refill: Capillary refill takes less than 2 seconds.  Neurological:     General: No focal deficit present.     Mental Status: He is alert.     GCS: GCS eye subscore is 4. GCS verbal subscore is 5. GCS motor subscore is 6.     Comments: Patient Parkinson's history, general mild weakness on exam moves extremities equal bilateral without any significant pain in major joints bilateral.    Psychiatric:     Comments: Tired appearing     ED Results / Procedures / Treatments   Labs (all labs ordered are listed, but only abnormal results are displayed) Labs Reviewed  BASIC METABOLIC PANEL - Abnormal; Notable for the following components:      Result Value   Potassium 5.2 (*)    Creatinine, Ser 1.30 (*)    All other components within normal limits  CBC WITH DIFFERENTIAL/PLATELET - Abnormal; Notable for the following components:   RBC 3.71 (*)    Hemoglobin 12.1 (*)    HCT  37.2 (*)    MCV 100.3 (*)    Platelets 141 (*)    All other components within normal limits  URINALYSIS, ROUTINE W REFLEX MICROSCOPIC  TROPONIN I (HIGH SENSITIVITY)    EKG EKG Interpretation Date/Time:  Thursday April 03 2023 12:18:19 EDT Ventricular Rate:  72 PR Interval:  160 QRS Duration:  91 QT Interval:  385 QTC Calculation: 422 R Axis:   18  Text Interpretation: Sinus rhythm Abnormal R-wave progression, early transition Baseline wander in lead(s) V3 Confirmed by Blane Ohara [phone]) on 04/03/2023 12:49:51 PM  Radiology DG Chest Portable 1 View  Result Date: 04/03/2023 CLINICAL DATA:  Pain after fall EXAM: PORTABLE CHEST 1 VIEW COMPARISON:  Ultrasound 04/07/2022 FINDINGS: Underinflation. There is some mild parenchymal opacity seen along the lung bases, left-greater-than-right. No pneumothorax or effusion. No edema. Normal cardiopericardial silhouette. Overlapping cardiac leads. IMPRESSION: Underinflation. Mild basilar opacity on the left-greater-than-right. Subtle infiltrate is not excluded. Recommend follow-up Electronically Signed   By: Karen Kays M.D.   On: 04/03/2023 15:21    Procedures .Marland KitchenLaceration Repair  Date/Time: 04/03/2023 2:58 PM  Performed by: Blane Ohara, MD Authorized by: Blane Ohara, MD   Consent:    Consent obtained:  Verbal   Consent given by:  Patient   Risks, benefits, and alternatives were discussed: yes     Risks discussed:  Infection and  pain Universal protocol:    Procedure explained and questions answered to patient or proxy's satisfaction: yes     Patient identity confirmed:  Arm band Anesthesia:    Anesthesia method:  None Laceration details:    Length (cm):  4   Depth (mm):  10 Pre-procedure details:    Preparation:  Patient was prepped and draped in usual sterile fashion and imaging obtained to evaluate for foreign bodies Exploration:    Hemostasis achieved with:  Direct pressure   Wound extent: areolar tissue not violated, fascia not violated and no foreign body   Treatment:    Area cleansed with:  Saline and soap and water   Amount of cleaning:  Standard   Irrigation solution:  Sterile saline   Irrigation volume:  50   Irrigation method:  Syringe   Visualized foreign bodies/material removed: no     Debridement:  Minimal   Undermining:  Minimal   Scar revision: no   Skin repair:    Repair method:  Sutures   Number of sutures:  5 Approximation:    Approximation:  Close Repair type:    Repair type:  Intermediate Post-procedure details:    Dressing:  Non-adherent dressing   Procedure completion:  Tolerated     Medications Ordered in ED Medications  fentaNYL (SUBLIMAZE) injection 50 mcg (50 mcg Intravenous Given 04/03/23 1418)  fentaNYL (SUBLIMAZE) injection 40 mcg (40 mcg Intravenous Given 04/03/23 1328)  lidocaine-EPINEPHrine (XYLOCAINE W/EPI) 2 %-1:200000 (PF) injection 20 mL (20 mLs Infiltration Given 04/03/23 1419)  sodium chloride 0.9 % bolus 500 mL (0 mLs Intravenous Stopped 04/03/23 1545)    ED Course/ Medical Decision Making/ A&P                                 Medical Decision Making Amount and/or Complexity of Data Reviewed Labs: ordered. Radiology: ordered.  Risk Prescription drug management. Decision regarding hospitalization.   Patient presents after unwitnessed fall and brief syncope, difficult to delineate if syncope led to the fall or patient passed out after hitting head.  Patient has Parkinson's history of this is likely secondary to that in addition to mild dehydration and his general weakness.  Differential broad including anemia, electrolyte, Parkinson related, intracranial bleeding, cardiac.  Plan for blood work, IV fluids, laceration repair and monitoring in the ER.  If results are unremarkable plan for shared decision making for observation in the hospital on telemetry versus close outpatient follow-up.  Laceration repaired by myself without difficulty with absorbable sutures.  CT scans pending.  Blood work independently reviewed showing potassium 5.2, hemoglobin 12, normal white count, troponin pending.  Discussed with internal medicine patient comfortable with observation/further workup for sudden onset syncope.        Final Clinical Impression(s) / ED Diagnoses Final diagnoses:  Acute head injury with loss of consciousness, initial encounter [facility])  Fall, initial encounter  Laceration of forehead, initial encounter  Syncope and collapse  History of Parkinson's disease  Dehydration  Acute renal insufficiency    Rx / DC Orders ED Discharge Orders     None         Blane Ohara, MD 04/03/23 1600

## 2023-04-03 NOTE — H&P (Signed)
Date: 04/03/2023               Patient Name:  Gabriel Dillon MRN: [id number]  DOB: Sep 04, 1955 Age / Sex: 67 y.o., male   PCP: Pcp, No         Medical Service: Internal Medicine Teaching Service         Attending Physician: Dr. Blane Ohara, MD      First Contact: Dr. Jeral Pinch, DO Pager [phone]    Second Contact: Dr. Modena Slater, DO Pager [phone]         After Hours (After 5p/  First Contact Pager: [phone]  weekends / holidays): Second Contact Pager: [phone]   SUBJECTIVE   Chief Complaint: syncope  History of Present Illness: Gabriel Dillon is a 67 yo male with past medical history of Parkinson's disease, severe anxiety, restless leg syndrome, sleep disorder, chronic pain who presented after a fall.  Roughly around 11:30 AM today, he was walking out of a building using his walker, when he passed out. He did not have any prodrome - did not feel dizzy/lightheaded, nor did he have any vision changes before he syncopized. He did not brace his fall because he had lost consciousness. This was unwitnessed.  Was not confused when he woke up. Reports it only lasted for couple of seconds, and he woke up to a pool of blood due to trauma to his left forehead with laceration that required 5 sutures in the ED. Patient denied bowel or bladder incontinence. EMS was worried one pupil was larger compared to other. Patient denies waking up dizzy/lightheaded or noticed any vision changes. Wife at beside reports multiple falls at home, stating he falls about 3-4 times daily, but is able to brace himself and has not lost conscious. Wife has noted patient "zoning-out" for seconds where he is not paying attention, and this has been once last week as well. She reports it does not happen as often and is concerned for seizure like activity.   He currently reports headaches and left sided rib pain. No chest pain. No n/v/d, no recent illness. No sick contacts.  Meds:  Carbidopa levodopa (Sinemet) 25-250  mg, 2 tablets every three hours.  Clonazepam 0.5 mg five times per day.   Requip 2 mg at bedtime Donepezil 5 mg at bedtime Gabapentin 600 mg in morning and afternoon, and 1200 mg at night Tramadol 50 mg BID PRN Seroquel 100 mg at night  Baclofen 5 mg once daily Meloxicam 15 mg daily  Effexor 75 mg at night = not taking   Past Medical History Parkinson's disease Severe anxiety Restless leg syndrome Sleep disorder Chronic low back pain Neck pain   Past Surgical History:  Procedure Laterality Date   APPENDECTOMY     CATARACT EXTRACTION W/PHACO Left 10/03/2021   Procedure: CATARACT EXTRACTION PHACO AND INTRAOCULAR LENS PLACEMENT (IOC) LEFT;  Surgeon: Lockie Mola, MD;  Location: [facility];  Service: Ophthalmology;  Laterality: Left;  5.89 00:28.9   CATARACT EXTRACTION W/PHACO Right 10/17/2021   Procedure: CATARACT EXTRACTION PHACO AND INTRAOCULAR LENS PLACEMENT (IOC) RIGHT;  Surgeon: Estanislado Pandy, MD;  Location: [facility];  Service: Ophthalmology;  Laterality: Right;  3.20 00:31.0   COLONOSCOPY WITH PROPOFOL N/A 10/13/2018   Procedure: COLONOSCOPY WITH PROPOFOL;  Surgeon: Christena Deem, MD;  Location: [facility] END[facility];  Service: Endoscopy;  Laterality: N/A;   COLONOSCOPY WITH PROPOFOL N/A 04/25/2021   Procedure: COLONOSCOPY WITH PROPOFOL;  Surgeon: Earline Mayotte, MD;  Location:  [facility] ENDOSCOPY;  Service: Endoscopy;  Laterality: N/A;   TONSILLECTOMY      Social:  Lives With: Son and Wife 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 Uses a walker for mobility.  PCP: Dr. Stoney Bang [facility]) Substances: No tobacco, no alcohol, no illicit substance use  Family History:  HTN in mother, DM Aunt has Parkinsons   Allergies: Allergies as of 04/03/2023 - Review Complete 04/03/2023  Allergen Reaction Noted   Ibuprofen Anaphylaxis  11/17/2015   Penicillins Anaphylaxis 11/17/2015   Prednisone Palpitations 12/09/2016   Citalopram  11/17/2015   Valproic acid Other (See Comments) 09/22/2019   Aspirin Palpitations 12/08/2015    Review of Systems: A complete ROS was negative except as per HPI.   OBJECTIVE:   Physical Exam: Blood pressure (!) 155/83, pulse (!) 59, temperature 97.7 F (36.5 C), temperature source Oral, resp. rate 20, height 5\' 8"  (1.727 m), weight 117.9 kg, SpO2 98%.   Constitutional: well appearing, no acute distress  HENT: bandage on the left forehead w/ 5 sutures, dried blood above left eyebrow and nasal bridge. No swelling or redness of the face. Mild TTP above left eyebrow and below left eye.  Eyes: conjunctiva non-erythematous Neck: supple Cardiovascular: bradycardic rate and rhythm, no m/r/g Pulmonary/Chest: normal work of breathing on room air, lungs clear to auscultation bilaterally Abdominal: soft, non-tender, non-distended, bowel sounds present  MSK: normal bulk and tone Neurological: alert & oriented x 3. Generalized weakness, no focal deficits  Skin: warm and dry Psych: Normal mood and affect   Labs: CBC    Component Value Date/Time   WBC 5.2 04/03/2023 1329   RBC 3.71 (L) 04/03/2023 1329   HGB 12.1 (L) 04/03/2023 1329   HGB 15.9 11/07/2013 1541   HCT 37.2 (L) 04/03/2023 1329   HCT 45.9 11/07/2013 1541   PLT 141 (L) 04/03/2023 1329   PLT 202 11/07/2013 1541   MCV 100.3 (H) 04/03/2023 1329   MCV 90 11/07/2013 1541   MCH 32.6 04/03/2023 1329   MCHC 32.5 04/03/2023 1329   RDW 13.6 04/03/2023 1329   RDW 13.0 11/07/2013 1541   LYMPHSABS 1.0 04/03/2023 1329   MONOABS 0.5 04/03/2023 1329   EOSABS 0.1 04/03/2023 1329   BASOSABS 0.0 04/03/2023 1329     CMP     Component Value Date/Time   NA 139 04/03/2023 1329   NA 137 11/07/2013 1541   K 5.2 (H) 04/03/2023 1329   K 3.6 11/07/2013 1541   CL 103 04/03/2023 1329   CL 105 11/07/2013 1541   CO2 27 04/03/2023 1329   CO2 30  11/07/2013 1541   GLUCOSE 96 04/03/2023 1329   GLUCOSE 96 11/07/2013 1541   BUN 18 04/03/2023 1329   BUN 8 11/07/2013 1541   CREATININE 1.30 (H) 04/03/2023 1329   CREATININE 1.31 (H) 11/07/2013 1541   CALCIUM 9.0 04/03/2023 1329   CALCIUM 8.6 11/07/2013 1541   PROT 6.7 11/18/2015 0440   ALBUMIN 3.7 11/18/2015 0440   AST 26 11/18/2015 0440   ALT 25 11/18/2015 0440   ALKPHOS 52 11/18/2015 0440   BILITOT 0.8 11/18/2015 0440   GFRNONAA >60 04/03/2023 1329   GFRNONAA >60 11/07/2013 1541   GFRAA >60 11/19/2015 0420   GFRAA >60 11/07/2013 1541    Imaging:  CXR 04/03/2023  IMPRESSION: Underinflation. Mild basilar opacity on the left-greater-than-right. Subtle infiltrate is not excluded. Recommend follow-up  CT  head cervical maxillofacial without contrast 04/03/2023  IMPRESSION: CT head:   No evidence of an acute intracranial abnormality.   CT maxillofacial:   1. No evidence of an acute maxillofacial fracture. 2. Subtle left forehead/periorbital soft tissue swelling is questioned. Correlate with physical exam findings.   CT cervical spine:   1. No evidence of an acute cervical spine fracture. 2. Nonspecific straightening of the expected cervical lordosis. 3. Mild dextrocurvature of the cervical spine. 4. 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 Multilevel spinal canal narrowing. 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 Multilevel bony neural foraminal narrowing. 5. 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 6. C3-C4 vertebral ankylosis.   EKG: personally reviewed my interpretation is Sinus rate and sinus rhythm. Normal PR interval, normal QRS duration and no Qtc prolongation compared to previous EKG.  ASSESSMENT & PLAN:   Assessment & Plan by Problem: Active Problems:   * No active hospital problems. *   Gabriel Dillon is a 67  y.o. person living with a history of  Parkinson's disease, severe anxiety, restless leg syndrome, sleep disorder, chronic pain who presented after a synopsized fall and admitted for further workup.   #Syncope  #Fall  #Left forehead laceration- sutured.   Presented with fall after syncope, no prodromal symptoms of dizzy, light headed, or vision changes. Reports LOC for couple of seconds and waking up to pool of blood from his left forehead laceration. No post-ictal state. Denied bowel or bladder incontinence. Differentials include vasovagal vs cardiogenic vs seizure vs orthostatics vs. autonomic dysfunction d/t parkinson's. Reports multiple of falls everyday at home where sometimes his walker gets away from him, but no LOC before. Wife does note periods of staring spells over the course of month that lasts for few seconds. Patient is also on many centrally acting agents at a high dose that includes Klonopin, Gabapentin, Seroquel and Carbidopa levodopa. High suspicion for cardiogenic syncope likely in the setting of arrhythmia as patient presented with syncope w/o a prodrome vs seizure due to staring spells noted by wife.  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 Multilevel spinal canal narrowing, spinal canal stenosis, and bony neural foraminal narrowing.  Patient does report taking multiple of pain medication that includes Gabapentin 600 mg in morning and afternoon, and 1200 mg at night, Tramadol 50 mg BID PRN, Baclofen 5 mg once daily and Meloxicam 15 mg daily. We are resuming these medication, except for baclofen and meloxicam, to prevent withdrawals.  - S/p sublimaze IV 40 mcg in ED - Tylenol 650 mg q6h prn  - Tramadol 50 mg BID  - Gabapentin to 600 mg in the morning, 600 mg in the afternoon, and 1200 mg at  night   #Parkinson's disease #Restless leg syndrome Patient follows at [facility] with Dr. Malvin Johns, neurology. Patient takes home medication Sinemet 25-250 mg, 2 tablets every 2-3 hours, Aricept 5 mg at night and Requip 2 mg at night. We have continued these medications here.  - Sinemet 25-250 mg 2 tablets six times daily  - Aricept 5 mg daily at bedtime  - Requip 2 mg daily at bedtime   #Severe anxiety Patient reports clonazepam 0.5 mg every five times daily and Seroquel 100 mg at  night. Patient was started on home medications to prevent withdrawals.  - Klonopin 0.5 mg five times daily - Seroquel 100 mg daily at bedtime   Diet: REGULAR VTE: Enoxaparin IVF: None  Code: Full  Prior to Admission Living Arrangement: Home, living Wife Anticipated Discharge Location: Home Barriers to Discharge: Medical Management   Dispo: Admit patient to Observation with expected length of stay less than 2 midnights.  Signed: Jeral Pinch, PGY1 Internal Medicine Resident PGY-1  04/03/2023, 4:31 PM

## 2023-04-03 NOTE — Discharge Instructions (Addendum)
Mr. Chi Illescas,  It was a pleasure taking care of you at [facility]. You were admitted for episodes of passing out.  We are thinking this is related to decreased blood pressure when standing up.  We are discharging you home now that you are doing better. Please follow the following instructions.   1) It is likely you had a episode of passing out because you were standing up too fast got dizzy and passed out.  During your hospital stay, we did check orthostatic vital signs which showed that your blood pressure does drop when you are standing up.  This could be leading you to becoming lightheaded.  Please take your time standing up, gather your feet, and then start walking.  We are decreasing your dose of gabapentin as it could be leading to you feeling that you are about to pass out. Since you do not have any Percocet at home, I will send you home with 6 tablets. Please use sparingly. You can use tylenol up to 800 mg every 8 hours.   2) Sometimes patients can pass out due to heart arrhythmias.  To rule this out, we did offer you a heart monitor, but you stated that you would like to get one from your [facility] cardiologist, please follow-up with your primary care doctor as well as your [facility] cardiologist to get heart monitor.  3) Regarding your falls, we are providing you information for outpatient therapy as well as obtaining a rollator to help keep your balance.  Please obtain your rollator outpatient.  You will be called for your therapy sessions.  Remember to take caution when you stand up and take your time with this.  4) Regarding your stitches, they should fall out on their own.  Can take Tylenol for pain control.  Do not overtake your Percocet.  Take care,  Dr. Modena Slater, DO

## 2023-04-03 NOTE — Progress Notes (Signed)
Unable to reach RN, chart says OTF, eeg to be attempted in about 30 minutes

## 2023-04-03 NOTE — ED Notes (Signed)
Internal medicine team is inside speaking with pt

## 2023-04-03 NOTE — Hospital Course (Addendum)
#  Syncope  #Fall  #Left forehead laceration- sutured.   Presented with fall after syncope, no prodromal symptoms of dizzy, light headed, or vision changes. Reports LOC for couple of seconds and waking up to pool of blood from his left forehead laceration. No post-ictal state. Denied bowel or bladder incontinence. Differentials include vasovagal vs cardiogenic vs seizure vs orthostatics vs. autonomic dysfunction d/t parkinson's. Reports multiple of falls everyday at home where sometimes his walker gets away from him, but no LOC before. Wife does note periods of staring spells over the course of month that lasts for few seconds. Patient is also on many centrally acting agents at a high dose that includes Klonopin, Gabapentin, Seroquel and Carbidopa levodopa. High suspicion for cardiogenic syncope likely in the setting of arrhythmia as patient presented with syncope w/o a prodrome vs seizure due to staring spells noted by wife. EEG showed moderate diffuse encephalopathy, nonspecific etiology.  But no seizures or epileptiform discharges.  Orthostatic vitals today show drop in systolic 157 to 138, from supine to sitting, then Sys 143 standing at 0 mins then Systolic 124 standing at 3 mins. PT/OT noted patient with difficulty holding his walker due to hand tremors and movement. Patient also presented with AKI likely due to dehydration. Thus many factors contributing to patient's fall, including dehydration, orthostatics, parkinson's kinesia, suspected arrhythmia. Currently, no arrhythmia on telemetry.  - TOC consulted for referral to outpatient's parkinson's clinic and modified walker.  - PT/OT recommended outpatient neuro PT  - Cardiology consulted for event monitor for patient.  - Follow up on ECHO    AKI Presented with creatinine of 1.30 baseline at 0.96.  Patient received IV normal saline 500 mL in ED.  Creatinine currently at 1.06. Patient will be given another 500 mL NS.  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 Multilevel spinal canal narrowing, spinal canal stenosis, and bony neural foraminal narrowing.  Patient does report taking multiple of pain medication that includes Gabapentin 600 mg in morning and afternoon, and 1200 mg at night, Tramadol 50 mg BID PRN, Baclofen 5 mg once daily and Meloxicam 15 mg daily. We are resuming these medication, except for baclofen and meloxicam, to prevent withdrawals.  - S/p sublimaze IV 40 mcg in ED - Tylenol 650 mg q6h prn  - Tramadol 50 mg BID  - Gabapentin to 600 mg in the morning, 600 mg in the afternoon, and 1200 mg at night    #Parkinson's disease #Restless leg syndrome Patient follows at [facility] with Dr. Malvin Johns, neurology. Patient takes home medication Sinemet 25-250 mg, 2 tablets every 2-3 hours, Aricept 5 mg at night and Requip 2 mg at night. We have continued these medications here.  - Sinemet 25-250 mg 2 tablets six times daily  - Aricept 5 mg daily at bedtime  - Requip 2 mg daily at bedtime    #Severe anxiety Patient reports clonazepam 0.5 mg every five times daily and Seroquel 100 mg at night. Patient was started on home medications to prevent withdrawals.  - Klonopin 0.5 mg five times daily - Seroquel 100 mg daily at bedtime    --------------------------------------------------------------------------------------------   04/05/23  Laying in bed, hasn't had a BM since Friday, says he empties his bowels every other day. Denies any active concerns at this time. On RA sating well.

## 2023-04-03 NOTE — ED Notes (Addendum)
Took pt to restroom, by assisting pt with a stand and pivot onto wheelchair.

## 2023-04-03 NOTE — ED Notes (Signed)
ED TO INPATIENT HANDOFF REPORT  ED Nurse Name and Phone #: Dot Lanes, paramedic [phone]  S Name/Age/Gender Gabriel Dillon 67 y.o. male Room/Bed: 022C/022C  Code Status   Code Status: Full Code  Home/SNF/Other Home Patient oriented to: self, place, time, and situation Is this baseline? Yes   Triage Complete: Triage complete  Chief Complaint Syncope [R55]  Triage Note Patient had unwitnessed fall in a parking lot just PTA. He does not know what caused him to fall. Complains of dizziness. A/O x 4. Pupils unequal in route. Hx Parkinson's.    Allergies Allergies  Allergen Reactions   Ibuprofen Anaphylaxis   Penicillins Anaphylaxis    Has patient had a PCN reaction causing immediate rash, facial/tongue/throat swelling, SOB or lightheadedness with hypotension: Yes Has patient had a PCN reaction causing severe rash involving mucus membranes or skin necrosis: Yes Has patient had a PCN reaction that required hospitalization Yes Has patient had a PCN reaction occurring within the last 10 years: Yes If all of the above answers are "NO", then may proceed with Cephalosporin use.    Prednisone Palpitations   Citalopram     dizziness    Valproic Acid Other (See Comments)    drowsiness   Aspirin Palpitations    Level of Care/Admitting Diagnosis ED Disposition     ED Disposition  Admit   Condition  --   Comment  Hospital Area: [facility] [[id number]]  Level of Care: Telemetry Medical [104]  May place patient in observation at [facility] or [facility] if equivalent level of care is available:: No  Covid Evaluation: Asymptomatic - no recent exposure (last 10 days) testing not required  Diagnosis: Syncope [206001]  Admitting Physician: Earl Lagos [[id number]]  Attending Physician: Earl Lagos [phone]          B Medical/Surgery History Past Medical History:  Diagnosis Date   Allergic rhinitis    Back pain    BPH (benign prostatic  hyperplasia)    Enlarged prostate    Fatty liver    Gout    Lumbar degenerative disc disease    Nephrolithiasis    Neuromuscular disorder (HCC)    Palpitation    Panic attacks    Panic attacks    Parkinson's disease    Sleep apnea    Urge incontinence    Past Surgical History:  Procedure Laterality Date   APPENDECTOMY     CATARACT EXTRACTION W/PHACO Left 10/03/2021   Procedure: CATARACT EXTRACTION PHACO AND INTRAOCULAR LENS PLACEMENT (IOC) LEFT;  Surgeon: Lockie Mola, MD;  Location: [facility];  Service: Ophthalmology;  Laterality: Left;  5.89 00:28.9   CATARACT EXTRACTION W/PHACO Right 10/17/2021   Procedure: CATARACT EXTRACTION PHACO AND INTRAOCULAR LENS PLACEMENT (IOC) RIGHT;  Surgeon: Estanislado Pandy, MD;  Location: [facility];  Service: Ophthalmology;  Laterality: Right;  3.20 00:31.0   COLONOSCOPY WITH PROPOFOL N/A 10/13/2018   Procedure: COLONOSCOPY WITH PROPOFOL;  Surgeon: Christena Deem, MD;  Location: [facility];  Service: Endoscopy;  Laterality: N/A;   COLONOSCOPY WITH PROPOFOL N/A 04/25/2021   Procedure: COLONOSCOPY WITH PROPOFOL;  Surgeon: Earline Mayotte, MD;  Location: [facility] ENDOSCOPY;  Service: Endoscopy;  Laterality: N/A;   TONSILLECTOMY       A IV Location/Drains/Wounds Patient Lines/Drains/Airways Status     Active Line/Drains/Airways     Name Placement date Placement time Site Days   Peripheral IV 04/03/23 18 G Anterior;Proximal;Right Forearm 04/03/23  1219  Forearm  less than 1  Intake/Output Last 24 hours No intake or output data in the 24 hours ending 04/03/23 1706  Labs/Imaging Results for orders placed or performed during the hospital encounter of 04/03/23 (from the past 48 hour(s))  Basic metabolic panel     Status: Abnormal   Collection Time: 04/03/23  1:29 PM  Result Value Ref Range   Sodium 139 135 - 145 mmol/L   Potassium 5.2 (H) 3.5 - 5.1 mmol/L   Chloride 103 98 - 111 mmol/L   CO2  27 22 - 32 mmol/L   Glucose, Bld 96 70 - 99 mg/dL    Comment: Glucose reference range applies only to samples taken after fasting for at least 8 hours.   BUN 18 8 - 23 mg/dL   Creatinine, Ser 1.32 (H) 0.61 - 1.24 mg/dL   Calcium 9.0 8.9 - 44.0 mg/dL   GFR, Estimated >10 >27 mL/min    Comment: (NOTE) Calculated using the CKD-EPI Creatinine Equation (2021)    Anion gap 9 5 - 15    Comment: Performed at [facility], [address], [place], [place] [address]  CBC with Differential     Status: Abnormal   Collection Time: 04/03/23  1:29 PM  Result Value Ref Range   WBC 5.2 4.0 - 10.5 K/uL   RBC 3.71 (L) 4.22 - 5.81 MIL/uL   Hemoglobin 12.1 (L) 13.0 - 17.0 g/dL   HCT 44.0 (L) 34.7 - 42.5 %   MCV 100.3 (H) 80.0 - 100.0 fL   MCH 32.6 26.0 - 34.0 pg   MCHC 32.5 30.0 - 36.0 g/dL   RDW 95.6 38.7 - 56.4 %   Platelets 141 (L) 150 - 400 K/uL   nRBC 0.0 0.0 - 0.2 %   Neutrophils Relative % 69 %   Neutro Abs 3.6 1.7 - 7.7 K/uL   Lymphocytes Relative 19 %   Lymphs Abs 1.0 0.7 - 4.0 K/uL   Monocytes Relative 9 %   Monocytes Absolute 0.5 0.1 - 1.0 K/uL   Eosinophils Relative 2 %   Eosinophils Absolute 0.1 0.0 - 0.5 K/uL   Basophils Relative 1 %   Basophils Absolute 0.0 0.0 - 0.1 K/uL   Immature Granulocytes 0 %   Abs Immature Granulocytes 0.01 0.00 - 0.07 K/uL    Comment: Performed at [facility], [address] [place], [place] [address]  Troponin I (High Sensitivity)     Status: None   Collection Time: 04/03/23  1:29 PM  Result Value Ref Range   Troponin I (High Sensitivity) 3 <18 ng/L    Comment: (NOTE) Elevated high sensitivity troponin I (hsTnI) values and significant  changes across serial measurements may suggest ACS but many other  chronic and acute conditions are known to elevate hsTnI results.  Refer to the "Links" section for chest pain algorithms and additional  guidance. Performed at [facility], [address] [place], [place] [address]    CT Head  Wo Contrast  Result Date: 04/03/2023 CLINICAL DATA:  Provided history: Head trauma, moderate/severe. Fall. Hematoma. EXAM: CT HEAD WITHOUT CONTRAST CT MAXILLOFACIAL WITHOUT CONTRAST CT CERVICAL SPINE WITHOUT CONTRAST TECHNIQUE: Multidetector CT imaging of the head, cervical spine, and maxillofacial structures were performed using the standard protocol without intravenous contrast. Multiplanar CT image reconstructions of the cervical spine and maxillofacial structures were also generated. RADIATION DOSE REDUCTION: This exam was performed according to the departmental dose-optimization program which includes automated exposure control, adjustment of the mA and/or kV according  to patient size and/or use of iterative reconstruction technique. COMPARISON:  Head CT 05/10/2019. FINDINGS: CT HEAD FINDINGS Brain: No age advanced or lobar predominant parenchymal atrophy. There is no acute intracranial hemorrhage. No demarcated cortical infarct. No extra-axial fluid collection. No evidence of an intracranial mass. No midline shift. Vascular: No hyperdense vessel.  Atherosclerotic calcifications. Skull: No calvarial fracture or aggressive osseous lesion. CT MAXILLOFACIAL FINDINGS Osseous: No acute maxillofacial fracture is identified. Orbits: No acute orbital finding. Sinuses: No significant paranasal sinus disease. Soft tissues: Subtle left forehead/periorbital soft tissue swelling is questioned. CT CERVICAL SPINE FINDINGS Alignment: Mild dextrocurvature of the cervical spine. Nonspecific straightening of the expected cervical lordosis. No significant spondylolisthesis. Skull base and vertebrae: The basion-dental and atlanto-dental intervals are maintained.No evidence of acute fracture to the cervical spine. The C1 anterior arch is fused to the skull base. C3-C4 vertebral ankylosis. 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 Soft tissues and spinal canal: No prevertebral fluid or swelling. No visible  canal hematoma. Disc levels: Cervical spondylosis with multilevel disc space narrowing, disc bulges/central disc protrusions, posterior osteophyte complexes, endplate spurring, uncovertebral hypertrophy and facet arthrosis. 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 Multilevel spinal canal narrowing. 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 Multilevel bony neural foraminal narrowing. Multilevel small ventral osteophytes. Upper chest: Within limitations of motion degradation, there is no consolidation within the imaged lung apices. No visible pneumothorax. IMPRESSION: CT head: No evidence of an acute intracranial abnormality. CT maxillofacial: 1. No evidence of an acute maxillofacial fracture. 2. Subtle left forehead/periorbital soft tissue swelling is questioned. Correlate with physical exam findings. CT cervical spine: 1. No evidence of an acute cervical spine fracture. 2. Nonspecific straightening of the expected cervical lordosis. 3. Mild dextrocurvature of the cervical spine. 4. 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 Multilevel spinal canal narrowing. 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 Multilevel bony neural foraminal narrowing. 5. 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 6. C3-C4 vertebral ankylosis. Electronically Signed   By: Jackey Loge D.O.   On: 04/03/2023 16:39   CT Cervical Spine Wo Contrast  Result Date: 04/03/2023 CLINICAL DATA:  Provided history: Head trauma, moderate/severe. Fall. Hematoma. EXAM: CT HEAD WITHOUT CONTRAST CT MAXILLOFACIAL WITHOUT CONTRAST CT CERVICAL SPINE WITHOUT CONTRAST TECHNIQUE: Multidetector CT imaging of the head, cervical spine, and maxillofacial structures were performed using the standard protocol without intravenous contrast. Multiplanar  CT image reconstructions of the cervical spine and maxillofacial structures were also generated. RADIATION DOSE REDUCTION: This exam was performed according to the departmental dose-optimization program which includes automated exposure control, adjustment of the mA and/or kV according to patient size and/or use of iterative reconstruction technique. COMPARISON:  Head CT 05/10/2019. FINDINGS: CT HEAD FINDINGS Brain: No age advanced or lobar predominant parenchymal atrophy. There is no acute intracranial hemorrhage. No demarcated cortical infarct. No extra-axial fluid collection. No evidence of an intracranial mass. No midline shift. Vascular: No hyperdense vessel.  Atherosclerotic calcifications. Skull: No calvarial fracture or aggressive osseous lesion. CT MAXILLOFACIAL FINDINGS Osseous: No acute maxillofacial fracture is identified. Orbits: No acute orbital finding. Sinuses: No significant paranasal sinus disease. Soft tissues: Subtle left forehead/periorbital soft tissue swelling is questioned. CT CERVICAL SPINE FINDINGS Alignment: Mild dextrocurvature of the cervical spine. Nonspecific straightening of the expected cervical lordosis. No significant spondylolisthesis. Skull base and vertebrae: The basion-dental and atlanto-dental intervals are  maintained.No evidence of acute fracture to the cervical spine. The C1 anterior arch is fused to the skull base. C3-C4 vertebral ankylosis. 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 Soft tissues and spinal canal: No prevertebral fluid or swelling. No visible canal hematoma. Disc levels: Cervical spondylosis with multilevel disc space narrowing, disc bulges/central disc protrusions, posterior osteophyte complexes, endplate spurring, uncovertebral hypertrophy and facet arthrosis. 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 Multilevel spinal canal narrowing. Most notably, multifactorial spinal canal stenosis appears at least moderate  in severity at C5-C6 and C6-C7. Multilevel bony neural foraminal narrowing. Multilevel small ventral osteophytes. Upper chest: Within limitations of motion degradation, there is no consolidation within the imaged lung apices. No visible pneumothorax. IMPRESSION: CT head: No evidence of an acute intracranial abnormality. CT maxillofacial: 1. No evidence of an acute maxillofacial fracture. 2. Subtle left forehead/periorbital soft tissue swelling is questioned. Correlate with physical exam findings. CT cervical spine: 1. No evidence of an acute cervical spine fracture. 2. Nonspecific straightening of the expected cervical lordosis. 3. Mild dextrocurvature of the cervical spine. 4. 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 Multilevel spinal canal narrowing. 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 Multilevel bony neural foraminal narrowing. 5. 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 6. C3-C4 vertebral ankylosis. Electronically Signed   By: Jackey Loge D.O.   On: 04/03/2023 16:39   CT Maxillofacial Wo Contrast  Result Date: 04/03/2023 CLINICAL DATA:  Provided history: Head trauma, moderate/severe. Fall. Hematoma. EXAM: CT HEAD WITHOUT CONTRAST CT MAXILLOFACIAL WITHOUT CONTRAST CT CERVICAL SPINE WITHOUT CONTRAST TECHNIQUE: Multidetector CT imaging of the head, cervical spine, and maxillofacial structures were performed using the standard protocol without intravenous contrast. Multiplanar CT image reconstructions of the cervical spine and maxillofacial structures were also generated. RADIATION DOSE REDUCTION: This exam was performed according to the departmental dose-optimization program which includes automated exposure control, adjustment of the mA and/or kV according to patient size and/or use of iterative reconstruction technique. COMPARISON:  Head CT 05/10/2019. FINDINGS: CT  HEAD FINDINGS Brain: No age advanced or lobar predominant parenchymal atrophy. There is no acute intracranial hemorrhage. No demarcated cortical infarct. No extra-axial fluid collection. No evidence of an intracranial mass. No midline shift. Vascular: No hyperdense vessel.  Atherosclerotic calcifications. Skull: No calvarial fracture or aggressive osseous lesion. CT MAXILLOFACIAL FINDINGS Osseous: No acute maxillofacial fracture is identified. Orbits: No acute orbital finding. Sinuses: No significant paranasal sinus disease. Soft tissues: Subtle left forehead/periorbital soft tissue swelling is questioned. CT CERVICAL SPINE FINDINGS Alignment: Mild dextrocurvature of the cervical spine. Nonspecific straightening of the expected cervical lordosis. No significant spondylolisthesis. Skull base and vertebrae: The basion-dental and atlanto-dental intervals are maintained.No evidence of acute fracture to the cervical spine. The C1 anterior arch is fused to the skull base. C3-C4 vertebral ankylosis. 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 Soft tissues and spinal canal: No prevertebral fluid or swelling. No visible canal hematoma. Disc levels: Cervical spondylosis with multilevel disc space narrowing, disc bulges/central disc protrusions, posterior osteophyte complexes, endplate spurring, uncovertebral hypertrophy and facet arthrosis. 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 Multilevel spinal canal narrowing. 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 Multilevel bony neural foraminal narrowing. Multilevel small ventral osteophytes. Upper chest: Within limitations of motion degradation, there is no consolidation within the imaged lung apices. No visible pneumothorax. IMPRESSION: CT head: No evidence of an  acute intracranial abnormality. CT maxillofacial: 1. No evidence of an acute maxillofacial fracture. 2. Subtle  left forehead/periorbital soft tissue swelling is questioned. Correlate with physical exam findings. CT cervical spine: 1. No evidence of an acute cervical spine fracture. 2. Nonspecific straightening of the expected cervical lordosis. 3. Mild dextrocurvature of the cervical spine. 4. 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 Multilevel spinal canal narrowing. 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 Multilevel bony neural foraminal narrowing. 5. 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 6. C3-C4 vertebral ankylosis. Electronically Signed   By: Jackey Loge D.O.   On: 04/03/2023 16:39   DG Chest Portable 1 View  Result Date: 04/03/2023 CLINICAL DATA:  Pain after fall EXAM: PORTABLE CHEST 1 VIEW COMPARISON:  Ultrasound 04/07/2022 FINDINGS: Underinflation. There is some mild parenchymal opacity seen along the lung bases, left-greater-than-right. No pneumothorax or effusion. No edema. Normal cardiopericardial silhouette. Overlapping cardiac leads. IMPRESSION: Underinflation. Mild basilar opacity on the left-greater-than-right. Subtle infiltrate is not excluded. Recommend follow-up Electronically Signed   By: Karen Kays M.D.   On: 04/03/2023 15:21    Pending Labs Unresulted Labs (From admission, onward)     Start     Ordered   04/04/23 0500  HIV Antibody (routine testing w rflx)  (HIV Antibody (Routine testing w reflex) panel)  Tomorrow morning,   R        04/03/23 1651   04/04/23 0500  Basic metabolic panel  Tomorrow morning,   R        04/03/23 1651   04/04/23 0500  CBC  Tomorrow morning,   R        04/03/23 1651   04/03/23 1322  Urinalysis, Routine w reflex microscopic -Urine, Clean Catch  ONCE - URGENT,   URGENT       Question:  Specimen Source  Answer:  Urine, Clean Catch   04/03/23 1321            Vitals/Pain Today's Vitals   04/03/23 1219 04/03/23  1419 04/03/23 1545 04/03/23 1546  BP: 121/81  (!) 155/83   Pulse: 68  (!) 59   Resp: (!) 24  20   Temp: (!) 97.4 F (36.3 C)   97.7 F (36.5 C)  TempSrc: Oral   Oral  SpO2: 97%  98%   Weight: 260 lb (117.9 kg)     Height: 5\' 8"  (1.727 m)     PainSc: 5  8       Isolation Precautions No active isolations  Medications Medications  fentaNYL (SUBLIMAZE) injection 50 mcg (50 mcg Intravenous Given 04/03/23 1418)  carbidopa-levodopa (SINEMET IR) 25-250 MG per tablet immediate release 2 tablet (2 tablets Oral Given 04/03/23 1630)  enoxaparin (LOVENOX) injection 40 mg (has no administration in time range)  acetaminophen (TYLENOL) tablet 650 mg (has no administration in time range)    Or  acetaminophen (TYLENOL) suppository 650 mg (has no administration in time range)  fentaNYL (SUBLIMAZE) injection 40 mcg (40 mcg Intravenous Given 04/03/23 1328)  lidocaine-EPINEPHrine (XYLOCAINE W/EPI) 2 %-1:200000 (PF) injection 20 mL (20 mLs Infiltration Given 04/03/23 1419)  sodium chloride 0.9 % bolus 500 mL (0 mLs Intravenous Stopped 04/03/23 1545)    Mobility walks with device     Focused Assessments    R Recommendations: See Admitting Provider Note  Report given to:   Additional Notes:

## 2023-04-03 NOTE — ED Notes (Addendum)
Pt attempted to obtain a urine sample in restroom but was unable to output any. Pt was assisted back to room 22 with a stand and pivot onto wheelchair

## 2023-04-03 NOTE — Plan of Care (Signed)
  Problem: Safety: Goal: Ability to remain free from injury will improve Outcome: Not Progressing   Problem: Pain Managment: Goal: General experience of comfort will improve Outcome: Not Progressing   

## 2023-04-03 NOTE — ED Triage Notes (Signed)
Patient had unwitnessed fall in a parking lot just PTA. He does not know what caused him to fall. Complains of dizziness. A/O x 4. Pupils unequal in route. Hx Parkinson's.

## 2023-04-04 ENCOUNTER — Observation Stay (HOSPITAL_COMMUNITY): Payer: Medicare PPO

## 2023-04-04 DIAGNOSIS — G894 Chronic pain syndrome: Secondary | ICD-10-CM | POA: Diagnosis present

## 2023-04-04 DIAGNOSIS — R55 Syncope and collapse: Secondary | ICD-10-CM

## 2023-04-04 DIAGNOSIS — R296 Repeated falls: Secondary | ICD-10-CM | POA: Diagnosis present

## 2023-04-04 DIAGNOSIS — Z79899 Other long term (current) drug therapy: Secondary | ICD-10-CM | POA: Diagnosis not present

## 2023-04-04 DIAGNOSIS — Z791 Long term (current) use of non-steroidal anti-inflammatories (NSAID): Secondary | ICD-10-CM | POA: Diagnosis not present

## 2023-04-04 DIAGNOSIS — F419 Anxiety disorder, unspecified: Secondary | ICD-10-CM | POA: Diagnosis present

## 2023-04-04 DIAGNOSIS — Z981 Arthrodesis status: Secondary | ICD-10-CM | POA: Diagnosis not present

## 2023-04-04 DIAGNOSIS — R569 Unspecified convulsions: Secondary | ICD-10-CM

## 2023-04-04 DIAGNOSIS — G479 Sleep disorder, unspecified: Secondary | ICD-10-CM | POA: Diagnosis present

## 2023-04-04 DIAGNOSIS — W19XXXA Unspecified fall, initial encounter: Secondary | ICD-10-CM | POA: Diagnosis present

## 2023-04-04 DIAGNOSIS — M47812 Spondylosis without myelopathy or radiculopathy, cervical region: Secondary | ICD-10-CM | POA: Diagnosis present

## 2023-04-04 DIAGNOSIS — G9349 Other encephalopathy: Secondary | ICD-10-CM | POA: Diagnosis present

## 2023-04-04 DIAGNOSIS — F458 Other somatoform disorders: Secondary | ICD-10-CM | POA: Diagnosis present

## 2023-04-04 DIAGNOSIS — G20A1 Parkinson's disease without dyskinesia, without mention of fluctuations: Secondary | ICD-10-CM | POA: Diagnosis present

## 2023-04-04 DIAGNOSIS — Z8249 Family history of ischemic heart disease and other diseases of the circulatory system: Secondary | ICD-10-CM | POA: Diagnosis not present

## 2023-04-04 DIAGNOSIS — Z820 Family history of epilepsy and other diseases of the nervous system: Secondary | ICD-10-CM | POA: Diagnosis not present

## 2023-04-04 DIAGNOSIS — I951 Orthostatic hypotension: Secondary | ICD-10-CM | POA: Diagnosis present

## 2023-04-04 DIAGNOSIS — T50995A Adverse effect of other drugs, medicaments and biological substances, initial encounter: Secondary | ICD-10-CM | POA: Diagnosis present

## 2023-04-04 DIAGNOSIS — S0181XA Laceration without foreign body of other part of head, initial encounter: Secondary | ICD-10-CM | POA: Diagnosis present

## 2023-04-04 DIAGNOSIS — M4802 Spinal stenosis, cervical region: Secondary | ICD-10-CM | POA: Diagnosis present

## 2023-04-04 DIAGNOSIS — N179 Acute kidney failure, unspecified: Secondary | ICD-10-CM | POA: Diagnosis present

## 2023-04-04 DIAGNOSIS — G2581 Restless legs syndrome: Secondary | ICD-10-CM | POA: Diagnosis present

## 2023-04-04 DIAGNOSIS — E860 Dehydration: Secondary | ICD-10-CM | POA: Diagnosis present

## 2023-04-04 DIAGNOSIS — M545 Low back pain, unspecified: Secondary | ICD-10-CM | POA: Diagnosis present

## 2023-04-04 LAB — CBC
HCT: 37.9 % — ABNORMAL LOW (ref 39.0–52.0)
Hemoglobin: 11.9 g/dL — ABNORMAL LOW (ref 13.0–17.0)
MCH: 31.1 pg (ref 26.0–34.0)
MCHC: 31.4 g/dL (ref 30.0–36.0)
MCV: 99 fL (ref 80.0–100.0)
Platelets: 130 10*3/uL — ABNORMAL LOW (ref 150–400)
RBC: 3.83 MIL/uL — ABNORMAL LOW (ref 4.22–5.81)
RDW: 13.7 % (ref 11.5–15.5)
WBC: 4.3 10*3/uL (ref 4.0–10.5)
nRBC: 0 % (ref 0.0–0.2)

## 2023-04-04 LAB — BASIC METABOLIC PANEL
Anion gap: 10 (ref 5–15)
BUN: 16 mg/dL (ref 8–23)
CO2: 24 mmol/L (ref 22–32)
Calcium: 8.6 mg/dL — ABNORMAL LOW (ref 8.9–10.3)
Chloride: 104 mmol/L (ref 98–111)
Creatinine, Ser: 1.06 mg/dL (ref 0.61–1.24)
GFR, Estimated: >60 mL/min (ref >60)
Glucose, Bld: 67 mg/dL — ABNORMAL LOW (ref 70–99)
Potassium: 4.3 mmol/L (ref 3.5–5.1)
Sodium: 138 mmol/L (ref 135–145)

## 2023-04-04 LAB — ECHOCARDIOGRAM COMPLETE
AR max vel: 2.36 cm2
AV Peak grad: 8 mmHg
Ao pk vel: 1.41 m/s
Area-P 1/2: 4.68 cm2
Calc EF: 65 %
Height: 68 in
S' Lateral: 3.7 cm
Single Plane A2C EF: 63.6 %
Single Plane A4C EF: 65.5 %
Weight: 4160 [oz_av]

## 2023-04-04 LAB — HIV ANTIBODY (ROUTINE TESTING W REFLEX): HIV Screen 4th Generation wRfx: NONREACTIVE

## 2023-04-04 LAB — GLUCOSE, CAPILLARY: Glucose-Capillary: 106 mg/dL — ABNORMAL HIGH (ref 70–99)

## 2023-04-04 MED ORDER — ENOXAPARIN SODIUM 60 MG/0.6ML IJ SOSY
60.0000 mg | PREFILLED_SYRINGE | INTRAMUSCULAR | Status: DC
  Administered 2023-04-04: 60 mg via SUBCUTANEOUS
  Filled 2023-04-04: qty 0.6

## 2023-04-04 MED ORDER — OXYCODONE-ACETAMINOPHEN 5-325 MG PO TABS
1.0000 | ORAL_TABLET | Freq: Every day | ORAL | Status: DC
  Administered 2023-04-04 – 2023-04-05 (×2): 1 via ORAL
  Filled 2023-04-04 (×2): qty 1

## 2023-04-04 MED ORDER — PERFLUTREN LIPID MICROSPHERE
1.0000 mL | INTRAVENOUS | Status: AC | PRN
  Administered 2023-04-04: 3 mL via INTRAVENOUS

## 2023-04-04 MED ORDER — LACTATED RINGERS IV BOLUS
500.0000 mL | Freq: Once | INTRAVENOUS | Status: AC
  Administered 2023-04-04: 500 mL via INTRAVENOUS

## 2023-04-04 MED ORDER — OXYCODONE-ACETAMINOPHEN 5-325 MG PO TABS: 1.0000 | ORAL_TABLET | Freq: Every day | ORAL | Status: DC

## 2023-04-04 MED ORDER — GABAPENTIN 300 MG PO CAPS
600.0000 mg | ORAL_CAPSULE | Freq: Once | ORAL | Status: AC
  Administered 2023-04-04: 600 mg via ORAL
  Filled 2023-04-04: qty 2

## 2023-04-04 NOTE — Progress Notes (Signed)
Occupational Therapy Evaluation Patient Details Name: Gabriel Dillon MRN: [id number] DOB: [birth year] Today's Date: 04/04/2023   History of Present Illness The pt is a 67 yo male presenting 8/15 after an unwitnessed fall in which he struck the L frontal part of his head. Imaging without acute imaging, work up for seizures and syncope. PMH includes: Parkinson's, anxiety, back pain, gout, sleep apnea, and cataracts.   Clinical Impression   PT admitted with fall and face laceration. Pt currently with functional limitiations due to the deficits listed below (see OT problem list). Pt at baseline is falling 3-5 times per day and able to get up off the floor without (A) at this time. Pt does report having a fear of falling and not feeling safe due to the frequency. Pt is agreeable to exercise and any program that can improve his quality of life. Recommending PARKINSON programs at outpatient.  Pt will benefit from skilled OT to increase their independence and safety with adls and balance to allow discharge home with family.  Pt lacks the ability to sustain grasp on rollator or RW due to Parkinson's tremor. Recommendation use of platform forearm support and much improved posture balance.        If plan is discharge home, recommend the following: A little help with walking and/or transfers;A little help with bathing/dressing/bathroom    Functional Status Assessment  Patient has had a recent decline in their functional status and demonstrates the ability to make significant improvements in function in a reasonable and predictable amount of time.  Equipment Recommendations  Other (comment) (upright rotator with platform support)    Recommendations for Other Services       Precautions / Restrictions Precautions Precautions: Fall Precaution Comments: falls 3-4x/day Restrictions Weight Bearing Restrictions: No      Mobility Bed Mobility Overal bed mobility: Modified Independent Bed Mobility:  Supine to Sit, Sit to Supine     Supine to sit: Modified independent (Device/Increase time), HOB elevated, Used rails Sit to supine: Modified independent (Device/Increase time), HOB elevated        Transfers Overall transfer level: Needs assistance Equipment used: Rolling walker (2 wheels) (stand up rollator) Transfers: Sit to/from Stand, Bed to chair/wheelchair/BSC Sit to Stand: Contact guard assist     Step pivot transfers: Contact guard assist, Min assist     General transfer comment: no physical assist to stand and steady, increased sway. stable with UE support. minA at times with turning due to LOB      Balance Overall balance assessment: Needs assistance, History of Falls Sitting-balance support: No upper extremity supported, Feet supported Sitting balance-Leahy Scale: Good     Standing balance support: Bilateral upper extremity supported, During functional activity Standing balance-Leahy Scale: Poor                 High Level Balance Comments: minA to steady, pt with poor tolerance for challenge           ADL either performed or assessed with clinical judgement   ADL Overall ADL's : Needs assistance/impaired Eating/Feeding: Set up;Sitting   Grooming: Set up;Sitting   Upper Body Bathing: Minimal assistance;Sitting   Lower Body Bathing: Minimal assistance;Sit to/from stand           Toilet Transfer: Minimal assistance;Ambulation;Regular Toilet (upright walker) Toilet Transfer Details (indicate cue type and reason): simulated         Functional mobility during ADLs: Minimal assistance (upright platform walker) General ADL Comments: pt ending PT session and noted to  have poor safety with RW. pt has a rollator at home. noted that patient was not sustaining grasp on RW due to parkinson's wrist movement. Pt provided upright platform rollator with increased stability. pt reports having parkinson's >7 years and not having participated in any parkinson  based therapy program only home health. pt would be a good candidate for the [facility] program or our [place] support groups. Family provided the [facility] link (printout) as reference for further follow up     Vision Baseline Vision/History: 1 Wears glasses Ability to See in Adequate Light: 1 Impaired Patient Visual Report: Other (comment) (swelling over L eye currently) Vision Assessment?: No apparent visual deficits Additional Comments: glasses are damaged from fall and striking L side of face. pt is still able to don the glasses     Perception         Praxis         Pertinent Vitals/Pain Pain Assessment Pain Assessment: Faces Faces Pain Scale: Hurts a little bit Pain Location: head Pain Descriptors / Indicators: Headache Pain Intervention(s): Limited activity within patient's tolerance     Extremity/Trunk Assessment Upper Extremity Assessment Upper Extremity Assessment: Right hand dominant;RUE deficits/detail;LUE deficits/detail RUE Deficits / Details: a resting tremor with a wrist twirl even when attempting to hold the RW. pt resting on the wrist palm up and no awareness to positioning RUE Sensation: decreased proprioception LUE Deficits / Details: a resting tremor with a wrist twirl even when attempting to hold the RW. pt resting on the wrist palm up and no awareness to positioning LUE Sensation: decreased proprioception   Lower Extremity Assessment Lower Extremity Assessment: Defer to PT evaluation RLE Deficits / Details: grossly 4+/5 to MMT, limited proprioception and coordination RLE Sensation: decreased proprioception RLE Coordination: decreased fine motor;decreased gross motor LLE Deficits / Details: grossly 4+/5 to MMT, limited proprioception and coordination LLE Sensation: decreased proprioception LLE Coordination: decreased fine motor;decreased gross motor   Cervical / Trunk Assessment Cervical / Trunk Assessment: Other exceptions Cervical / Trunk  Exceptions: large body habitus   Communication Communication Communication: No apparent difficulties Cueing Techniques: Verbal cues   Cognition Arousal: Alert Behavior During Therapy: WFL for tasks assessed/performed Overall Cognitive Status: Impaired/Different from baseline Area of Impairment: Safety/judgement, Problem solving                         Safety/Judgement: Decreased awareness of safety, Decreased awareness of deficits   Problem Solving: Requires verbal cues General Comments: decreased insight to safety, problem solving deficits, awareness of fall risk     General Comments  orthostatic BP obtained and put into chart. Pt educated on use of ted hose of compression sleeves for calves to help with BP control. Pt verbalized not liking ted hose that squeeze his TOES. a calf sleeve would be modification to see if it gave enough support    Exercises Exercises: Other exercises Other Exercises Other Exercises: educated on outpatient Parkinson program [facility]   Shoulder Instructions      Home Living Family/patient expects to be discharged to:: Private residence Living Arrangements: Spouse/significant other Available Help at Discharge: Family;Available 24 hours/day Type of Home: House Home Access: Stairs to enter [employer] of Steps: 4-5 Entrance Stairs-Rails: Right Home Layout: One level     Bathroom Shower/Tub: Walk-in shower (modified shower with a seat and door)   Bathroom Toilet: Standard Bathroom Accessibility: Yes   Home Equipment: Rollator (4 wheels);Shower seat   Additional Comments: wife reports he  was being worked up for power scooter by [facility]      Prior Functioning/Environment Prior Level of Function : History of Falls (last six months);Independent/Modified Independent             Mobility Comments: mobilizing with use of rollator, 3-5 falls/day. poor control of rollator in home. more falls in afternoon/evening when tired ADLs  Comments: independent, sits for showers        OT Problem List: Decreased activity tolerance;Impaired balance (sitting and/or standing);Decreased cognition;Decreased safety awareness;Decreased knowledge of use of DME or AE;Decreased knowledge of precautions;Obesity      OT Treatment/Interventions: Self-care/ADL training;Therapeutic exercise;Neuromuscular education;Energy conservation;DME and/or AE instruction;Manual therapy;Modalities;Therapeutic activities;Cognitive remediation/compensation;Patient/family education;Balance training    OT Goals(Current goals can be found in the care plan section) Acute Rehab OT Goals Patient Stated Goal: to get to go home and stop falling OT Goal Formulation: With patient/family Time For Goal Achievement: 04/18/23 Potential to Achieve Goals: Good  OT Frequency: Min 1X/week    Co-evaluation              AM-PAC OT "6 Clicks" Daily Activity     Outcome Measure Help from another person eating meals?: A Little Help from another person taking care of personal grooming?: A Little Help from another person toileting, which includes using toliet, bedpan, or urinal?: A Little Help from another person bathing (including washing, rinsing, drying)?: A Lot Help from another person to put on and taking off regular upper body clothing?: A Little Help from another person to put on and taking off regular lower body clothing?: A Little 6 Click Score: 17   End of Session Equipment Utilized During Treatment: Gait belt;Other (comment) (upright rolling walker) Nurse Communication: Mobility status;Precautions  Activity Tolerance: Patient tolerated treatment well Patient left: in bed;with call bell/phone within reach;with bed alarm set;with nursing/sitter in room  OT Visit Diagnosis: Unsteadiness on feet (R26.81);Muscle weakness (generalized) (M62.81);Repeated falls (R29.6)                Time: 6440-3474 OT Time Calculation (min): 40 min Charges:  OT General  Charges $OT Visit: 1 Visit OT Evaluation $OT Eval Moderate Complexity: 1 Mod OT Treatments $Self Care/Home Management : 8-22 mins   Brynn, OTR/L  Acute Rehabilitation Services Office: [phone] .   Mateo Flow 04/04/2023, 12:02 PM

## 2023-04-04 NOTE — Progress Notes (Signed)
Attempted Echocardiogram, however patient is currently doing their physical therapy assessment. Physical therapist stated to come back in 20 minutes.

## 2023-04-04 NOTE — Progress Notes (Signed)
HD#0 SUBJECTIVE:  Patient Summary: Gabriel Dillon is a 67 yo male with past medical history of Parkinson's disease, severe anxiety, restless leg syndrome, sleep disorder, chronic pain who presented after a synopsized fall.    Overnight Events: Patient was complains of 8/10 back pain Patient was given gabapentin 600 mg and a heating pad.   Interim History: Patient is evaluated at bedside. PT OT in room. Patient denies any chest pain, shortness of breath.  Patient denies any lightheadedness, dizziness, blurring of vision.  Patient reports headache at the left frontal head where he had laceration, left rib pain as well.  Patient denies any abdominal pain, nausea, vomiting.  Wife at bedside, reports no staring spells.  Patient worked with PT/OT, denies any acute concerns.  OBJECTIVE:  Vital Signs: Vitals:   04/03/23 2022 04/04/23 0044 04/04/23 0401 04/04/23 0759  BP: (!) 139/95 129/86 139/85 125/68  Pulse: 65 (!) 59 (!) 56 (!) 59  Resp:    20  Temp: 98.1 F (36.7 C) 98.1 F (36.7 C) 98 F (36.7 C) 98 F (36.7 C)  TempSrc: Axillary Axillary Axillary Oral  SpO2: 96% 99% 100% 99%  Weight:      Height:       Supplemental O2: Room Air SpO2: 99 %  Filed Weights   04/03/23 1219  Weight: 117.9 kg     Intake/Output Summary (Last 24 hours) at 04/04/2023 1134 Last data filed at 04/04/2023 8295 Gross per 24 hour  Intake 948 ml  Output 700 ml  Net 248 ml   Net IO Since Admission: 248 mL [04/04/23 1134]  Physical Exam: Physical Exam  Constitutional: well appearing, no acute distress  HENT: bandage on the left forehead, with left eye ecchymosis. Mild TTP above left eyebrow and below left eye.  Eyes: conjunctiva non-erythematous Neck: supple Cardiovascular: bradycardic rate and rhythm, no m/r/g Pulmonary/Chest: normal work of breathing on room air, lungs clear to auscultation bilaterally Abdominal: soft, non-tender, non-distended, bowel sounds present  MSK: normal bulk and  tone Neurological: alert & oriented x 3. Generalized weakness, no focal deficits  Skin: warm and dry Psych: Normal mood and affect   Patient Lines/Drains/Airways Status     Active Line/Drains/Airways     Name Placement date Placement time Site Days   Peripheral IV 04/03/23 18 G Anterior;Proximal;Right Forearm 04/03/23  1219  Forearm  1             ASSESSMENT/PLAN:  Assessment: Principal Problem:   Syncope   Plan: #Syncope  #Fall  #Left forehead laceration- sutured.   Presented with fall after syncope, no prodromal symptoms of dizzy, light headed, or vision changes. Reports LOC for couple of seconds and waking up to pool of blood from his left forehead laceration. No post-ictal state. Denied bowel or bladder incontinence. Differentials include vasovagal vs cardiogenic vs seizure vs orthostatics vs. autonomic dysfunction d/t parkinson's. Reports multiple of falls everyday at home where sometimes his walker gets away from him, but no LOC before. Wife does note periods of staring spells over the course of month that lasts for few seconds. Patient is also on many centrally acting agents at a high dose that includes Klonopin, Gabapentin, Seroquel and Carbidopa levodopa. High suspicion for cardiogenic syncope likely in the setting of arrhythmia as patient presented with syncope w/o a prodrome vs seizure due to staring spells noted by wife. EEG showed moderate diffuse encephalopathy, nonspecific etiology.  But no seizures or epileptiform discharges.  Orthostatic vitals today show drop in systolic  157 to 138, from supine to sitting, then Sys 143 standing at 0 mins then Systolic 124 standing at 3 mins. PT/OT noted patient with difficulty holding his walker due to hand tremors and movement. Patient also presented with AKI likely due to dehydration. Thus many factors contributing to patient's fall, including dehydration, orthostatics, parkinson's kinesia, suspected arrhythmia. Currently, no  arrhythmia on telemetry.  - TOC consulted for referral to outpatient's parkinson's clinic and modified walker.  - PT/OT recommended outpatient neuro PT  - Cardiology consulted for event monitor for patient.  - Follow up on ECHO   AKI Presented with creatinine of 1.30 baseline at 0.96.  Patient received IV normal saline 500 mL in ED.  Creatinine currently at 1.06. Patient will be given another 500 mL NS.  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 Multilevel spinal canal narrowing, spinal canal stenosis, and bony neural foraminal narrowing.  Patient does report taking multiple of pain medication that includes Gabapentin 600 mg in morning and afternoon, and 1200 mg at night, Tramadol 50 mg BID PRN, Baclofen 5 mg once daily and Meloxicam 15 mg daily. We are resuming these medication, except for baclofen and meloxicam, to prevent withdrawals.  - S/p sublimaze IV 40 mcg in ED - Tylenol 650 mg q6h prn  - Tramadol 50 mg BID  - Gabapentin to 600 mg in the morning, 600 mg in the afternoon, and 1200 mg at night    #Parkinson's disease #Restless leg syndrome Patient follows at [facility] with Dr. Malvin Johns, neurology. Patient takes home medication Sinemet 25-250 mg, 2 tablets every 2-3 hours, Aricept 5 mg at night and Requip 2 mg at night. We have continued these medications here.  - Sinemet 25-250 mg 2 tablets six times daily  - Aricept 5 mg daily at bedtime  - Requip 2 mg daily at bedtime    #Severe anxiety Patient reports clonazepam 0.5 mg every five times daily and Seroquel 100 mg at night. Patient was started on home medications to prevent withdrawals.  - Klonopin 0.5 mg five times daily - Seroquel 100 mg daily at bedtime   Best Practice: Diet: Regular diet IVF: Fluids: LR, Rate:  500 cc bolus  VTE: enoxaparin  (LOVENOX) injection 40 mg Start: 04/03/23 2200 Code: Full DISPO: Anticipated discharge tomorrow to Home pending  Medical Management .  Signature: Jeral Pinch, D.O.  Internal Medicine Resident, PGY-1 [facility]  Pager: [phone] 11:34 AM, 04/04/2023   Please contact the on call pager after 5 pm and on weekends at [phone].

## 2023-04-04 NOTE — Evaluation (Signed)
Physical Therapy Evaluation Patient Details Name: Gabriel Dillon MRN: [id number] DOB: Aug 03, 1956 Today's Date: 04/04/2023  History of Present Illness  The pt is a 67 yo male presenting 8/15 after an unwitnessed fall in which he struck the L frontal part of his head. Imaging without acute imaging, work up for seizures and syncope. PMH includes: Parkinson's, anxiety, back pain, gout, sleep apnea, and cataracts.   Clinical Impression  Pt in bed upon arrival of PT, agreeable to evaluation at this time. Prior to admission the pt was ambulating with use of rollator, wife reports 3-5 falls each day but that the pt is typically able to get up without assistance. The pt now presents with limitations in functional mobility, coordination, proprioception, dynamic stability, and safety awareness due to orthostatic vitals and chronic conditions. The pt will continue to benefit from skilled PT acutely and at OP neuro clinic at follow up to assist with balance, power, and stability, as well as to establish him with community resources for individuals with Parkinson's. The pt required minA with ambulation using RW due to difficulty maintaining grip on walker due to hand movements/tremors. He was able to manage upright rollator with forearm support with CGA for hallway ambulation. He was educated on safety features and will benefit from additional training, but is much safer with upright rollator than regular rollator or RW due to grip issues. The pt and his wife were provided with some information regarding community resources for individuals with Parkinson's, are in agreement with current plan.   VITALS:  - supine in bed- BP: 157/85 (104); HR: 65bpm - sitting EOB - BP: 138/84 (101); HR: 62bpm - standing - BP: 143/124 (131); HR: 63bpm *suspect error due to tremor while taking BP* - standing after 3 min - BP: 124/72 (85); HR: 66bpm      If plan is discharge home, recommend the following: A little help with walking  and/or transfers;A little help with bathing/dressing/bathroom;Assistance with cooking/housework;Assist for transportation;Help with stairs or ramp for entrance   Can travel by private vehicle        Equipment Recommendations Other (comment) (stand up/upright rollator)  Recommendations for Other Services       Functional Status Assessment Patient has had a recent decline in their functional status and demonstrates the ability to make significant improvements in function in a reasonable and predictable amount of time.     Precautions / Restrictions Precautions Precautions: Fall Precaution Comments: falls 3-4x/day Restrictions Weight Bearing Restrictions: No      Mobility  Bed Mobility Overal bed mobility: Modified Independent Bed Mobility: Supine to Sit, Sit to Supine     Supine to sit: Modified independent (Device/Increase time), HOB elevated, Used rails Sit to supine: Modified independent (Device/Increase time), HOB elevated        Transfers Overall transfer level: Needs assistance Equipment used: Rolling walker (2 wheels) (stand up rollator) Transfers: Sit to/from Stand, Bed to chair/wheelchair/BSC Sit to Stand: Contact guard assist   Step pivot transfers: Contact guard assist, Min assist       General transfer comment: no physical assist to stand and steady, increased sway. stable with UE support. minA at times with turning due to LOB    Ambulation/Gait Ambulation/Gait assistance: Min assist, Contact guard assist Gait Distance (Feet): 150 Feet (+ 230ft) Assistive device: Rolling walker (2 wheels), Standup Rollator Gait Pattern/deviations: Step-through pattern, Decreased stride length, Shuffle, Staggering left, Trunk flexed, Narrow base of support Gait velocity: too quick for safety at times  General Gait Details: pt with small shuffling steps, drifting L in RW, exacerbated with turning to L. pt with significant difficulty holding grips due to Parkinsonian  movements in hands, improved stability with standup rollator    Balance Overall balance assessment: Needs assistance, History of Falls Sitting-balance support: No upper extremity supported, Feet supported Sitting balance-Leahy Scale: Good     Standing balance support: Bilateral upper extremity supported, During functional activity Standing balance-Leahy Scale: Poor               High level balance activites: Backward walking, Direction changes, Sudden stops, Turns, Head turns High Level Balance Comments: minA to steady, pt with poor tolerance for challenge             Pertinent Vitals/Pain Pain Assessment Pain Assessment: Faces Faces Pain Scale: Hurts a little bit Pain Location: headache Pain Descriptors / Indicators: Headache Pain Intervention(s): Limited activity within patient's tolerance, Monitored during session, Repositioned    Home Living Family/patient expects to be discharged to:: Private residence Living Arrangements: Spouse/significant other Available Help at Discharge: Family;Available 24 hours/day Type of Home: House Home Access: Stairs to enter Entrance Stairs-Rails: Right Entrance Stairs-Number of Steps: 4-5   Home Layout: One level Home Equipment: Rollator (4 wheels);Shower seat Additional Comments: wife reports he was being worked up for Publishing rights manager by [facility]    Prior Function Prior Level of Function : History of Falls (last six months);Independent/Modified Independent             Mobility Comments: mobilizing with use of rollator, 3-5 falls/day. poor control of rollator in home. more falls in afternoon/evening when tired ADLs Comments: independent, sits for showers     Extremity/Trunk Assessment   Upper Extremity Assessment Upper Extremity Assessment: Defer to OT evaluation    Lower Extremity Assessment Lower Extremity Assessment: RLE deficits/detail;LLE deficits/detail (good strength to MMT, poor proprioception bilaterally and with  limited endurance) RLE Deficits / Details: grossly 4+/5 to MMT, limited proprioception and coordination RLE Sensation: decreased proprioception RLE Coordination: decreased fine motor;decreased gross motor LLE Deficits / Details: grossly 4+/5 to MMT, limited proprioception and coordination LLE Sensation: decreased proprioception LLE Coordination: decreased fine motor;decreased gross motor    Cervical / Trunk Assessment Cervical / Trunk Assessment: Other exceptions Cervical / Trunk Exceptions: large body habitus  Communication   Communication Communication: No apparent difficulties Cueing Techniques: Verbal cues  Cognition Arousal: Alert Behavior During Therapy: WFL for tasks assessed/performed Overall Cognitive Status: Impaired/Different from baseline Area of Impairment: Safety/judgement, Problem solving                         Safety/Judgement: Decreased awareness of safety, Decreased awareness of deficits   Problem Solving: Requires verbal cues General Comments: decreased insight to safety, problem solving deficits, awareness of fall risk        General Comments General comments (skin integrity, edema, etc.): pt and wife provided Parkinson's resources. BP orthostatic from 157/85 to 124/72    Exercises     Assessment/Plan    PT Assessment Patient needs continued PT services  PT Problem List Decreased strength;Decreased activity tolerance;Decreased balance;Decreased mobility;Decreased coordination;Decreased safety awareness       PT Treatment Interventions DME instruction;Gait training;Functional mobility training;Therapeutic activities;Therapeutic exercise;Stair training;Balance training;Neuromuscular re-education;Patient/family education    PT Goals (Current goals can be found in the Care Plan section)  Acute Rehab PT Goals Patient Stated Goal: to reduce frequency of falls PT Goal Formulation: With patient/family Time For Goal Achievement:  04/18/23  Potential to Achieve Goals: Good    Frequency Min 1X/week        AM-PAC PT "6 Clicks" Mobility  Outcome Measure Help needed turning from your back to your side while in a flat bed without using bedrails?: None Help needed moving from lying on your back to sitting on the side of a flat bed without using bedrails?: None Help needed moving to and from a bed to a chair (including a wheelchair)?: A Little Help needed standing up from a chair using your arms (e.g., wheelchair or bedside chair)?: A Little Help needed to walk in hospital room?: A Little Help needed climbing 3-5 steps with a railing? : A Little 6 Click Score: 20    End of Session Equipment Utilized During Treatment: Gait belt Activity Tolerance: Patient tolerated treatment well Patient left: in bed;with call bell/phone within reach;with family/visitor present Nurse Communication: Mobility status PT Visit Diagnosis: Unsteadiness on feet (R26.81);Other abnormalities of gait and mobility (R26.89);Repeated falls (R29.6)    Time: 1005-1104 PT Time Calculation (min) (ACUTE ONLY): 59 min   Charges:   PT Evaluation $PT Eval Moderate Complexity: 1 Mod PT Treatments $Gait Training: 8-22 mins PT General Charges $$ ACUTE PT VISIT: 1 Visit         Vickki Muff, PT, DPT   Acute Rehabilitation Department Office [phone] Secure Chat Communication Preferred  Ronnie Derby 04/04/2023, 11:41 AM

## 2023-04-04 NOTE — Progress Notes (Signed)
   04/04/23 2000  BiPAP/CPAP/SIPAP  Reason BIPAP/CPAP not in use Non-compliant  BiPAP/CPAP /SiPAP Vitals  Resp 18  MEWS Score/Color  MEWS Score 0  MEWS Score Color Green   Pt states he does not wear a CPAP at home and does not want to wear one here.  Nelda Marseille

## 2023-04-04 NOTE — Progress Notes (Signed)
EEG complete - results pending 

## 2023-04-04 NOTE — TOC Initial Note (Signed)
Transition of Care [facility]) - Initial/Assessment Note    Patient Details  Name: Gabriel Dillon MRN: [id number] Date of Birth: 1955-09-16  Transition of Care [facility]) CM/SW Contact:    Kermit Balo, RN Phone Number: 04/04/2023, 12:48 PM  Clinical Narrative:                 Pt is from home with his spouse. She is with him most of the time. Pt has had multiple falls at home.  DME: rollator at home. PT/OT inquiring about upright rollator. CM was able to find that insurance will not cover the cost. CM has updated the wife and pt and let them know where to purchase the DME. Outpatient therapy recommended. They prefer [facility]. CM has sent the referral to [facility] outpatient therapy. Information on the AVS. PCP: Dr Ali Lowe at [facility] Wife will provide transport home when medically ready.  Expected Discharge Plan: OP Rehab Barriers to Discharge: Continued Medical Work up   Patient Goals and CMS Choice   CMS Medicare.gov Compare Post Acute Care list provided to:: Patient Represenative (must comment) Choice offered to / list presented to : Spouse, Patient      Expected Discharge Plan and Services   Discharge Planning Services: CM Consult   Living arrangements for the past 2 months: Single Family Home                                      Prior Living Arrangements/Services Living arrangements for the past 2 months: Single Family Home Lives with:: Spouse Patient language and need for interpreter reviewed:: Yes Do you feel safe going back to the place where you live?: Yes      Need for Family Participation in Patient Care: Yes (Comment) Care giver support system in place?: Yes (comment) Current home services: DME (rollator) Criminal Activity/Legal Involvement Pertinent to Current Situation/Hospitalization: No - Comment as needed  Activities of Daily Living Home Assistive Devices/Equipment: Cane (specify quad or straight) ADL Screening (condition at time of admission) Patient's  cognitive ability adequate to safely complete daily activities?: Yes Is the patient deaf or have difficulty hearing?: No Does the patient have difficulty seeing, even when wearing glasses/contacts?: No Does the patient have difficulty concentrating, remembering, or making decisions?: No Patient able to express need for assistance with ADLs?: Yes Does the patient have difficulty dressing or bathing?: No Independently performs ADLs?: Yes (appropriate for developmental age) Does the patient have difficulty walking or climbing stairs?: No Weakness of Legs: Both Weakness of Arms/Hands: None  Permission Sought/Granted                  Emotional Assessment Appearance:: Appears stated age Attitude/Demeanor/Rapport: Engaged Affect (typically observed): Accepting Orientation: : Oriented to Self, Oriented to Place, Oriented to Situation, Oriented to  Time   Psych Involvement: No (comment)  Admission diagnosis:  Syncope and collapse [R55] Dehydration [E86.0] Syncope [R55] Acute renal insufficiency [N28.9] History of Parkinson's disease [Z86.69] Fall, initial encounter [W19.XXXA] Laceration of forehead, initial encounter [S01.81XA] Acute head injury with loss of consciousness, initial encounter [facility]) [S06.9X9A] Patient Active Problem List   Diagnosis Date Noted   Syncope 04/03/2023   Noncompliance with medication regimen 05/02/2020   High risk medication use 03/21/2020   Lumbar stenosis without neurogenic claudication 03/13/2020   SOB (shortness of breath) on exertion 11/30/2019   Weakness 10/26/2019   Shuffling gait 10/26/2019   OSA (obstructive  sleep apnea) 10/26/2019   MCI (mild cognitive impairment) 10/26/2019   Impaired glucose tolerance 09/30/2019   Lymphedema of both lower extremities 09/22/2019   Hyperlipidemia, mixed 09/22/2019   Loss of memory 09/21/2019   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/21/2019   Sleep difficulties 03/16/2019   Anxiety, generalized 02/24/2019    GAD (generalized anxiety disorder) 02/24/2019   Insomnia 02/24/2019   Chronic left-sided low back pain with bilateral sciatica 09/18/2018   Encounter for medication management 09/18/2018   Anxiousness 09/03/2018   BMI 40.0-44.9, adult (HCC) 06/23/2018   History of anxiety 05/07/2018   Sleep disorder 05/07/2018   Restless leg syndrome 02/26/2018   Constipation 06/26/2017   Resting tremor 06/26/2017   Parkinson's disease 04/24/2017   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   Fatty liver 07/10/2016   B12 deficiency 04/08/2016   Chest pain 11/17/2015   Allergic rhinitis 08/16/2014   Benign prostatic hyperplasia 08/16/2014   Panic disorder 08/16/2014   Lumbar degenerative disc disease 08/16/2014   Generalized anxiety disorder 08/16/2014   Calculus of kidney 06/29/2014   Frequency of micturition 06/29/2014   Right lower quadrant pain 06/29/2014   Pelvic and perineal pain 04/06/2013   Nocturia 04/06/2013   Male pelvic pain 04/06/2013   Urinary urgency 04/06/2013   Urge incontinence 04/06/2013   Slowing of urinary stream 04/06/2013   Family history of malignant neoplasm 03/13/2012   Prostatitis 02/07/2012   PCP:  Oneita Hurt, No Pharmacy:   [facility] [id number] [place], [place] - [address] [place] [address] Phone: [phone] Fax: [phone]  [id number] - [place], [place] - [address] [place] [address] Phone: [phone] Fax: [phone]     Social Determinants of Health (SDOH) Social History: SDOH Screenings   Food Insecurity: No Food Insecurity (04/03/2023)  Housing: Low Risk  (04/03/2023)  Transportation Needs: No Transportation Needs (04/03/2023)  Utilities: Not At Risk (04/03/2023)  Financial Resource Strain: Low Risk  (11/07/2022)   Received from [facility]  Physical Activity: Inactive (12/09/2018)  Social Connections: Unknown (12/09/2018)  Stress: Stress Concern Present  (12/09/2018)  Tobacco Use: Medium Risk (03/17/2023)   Received from [facility]  Health Literacy: Low Risk  (11/07/2022)   Received from [facility]   SDOH Interventions:     Readmission Risk Interventions     No data to display

## 2023-04-04 NOTE — Procedures (Signed)
Patient Name: Gabriel Dillon  MRN: [id number]  Epilepsy Attending: Charlsie Quest  Referring Physician/Provider: Chauncey Mann, DO  Date: 04/04/2023 Duration: 44.24 mins  Patient history: 67yo M with syncope getting eeg to evaluate for seizure.  Level of alertness: Awake  AEDs during EEG study: Clonazepam, GBP  Technical aspects: This EEG study was done with scalp electrodes positioned according to the 10-20 International system of electrode placement. Electrical activity was reviewed with band pass filter of 1-70Hz , sensitivity of 7 uV/mm, display speed of 58mm/sec with a 60Hz  notched filter applied as appropriate. EEG data were recorded continuously and digitally stored.  Video monitoring was available and reviewed as appropriate.  Description: The posterior dominant rhythm consists of 6 Hz activity of moderate voltage (25-35 uV) seen predominantly in posterior head regions, symmetric and reactive to eye opening and eye closing. EEG showed continuous generalized 3 to 6 Hz theta-delta slowing. Hyperventilation and photic stimulation were not performed.     ABNORMALITY - Continuous slow, generalized - Background slow  IMPRESSION: This study is suggestive of moderate diffuse encephalopathy, nonspecific etiology. No seizures or epileptiform discharges were seen throughout the recording.  Anisia Leija Annabelle Harman

## 2023-04-05 ENCOUNTER — Encounter (HOSPITAL_COMMUNITY): Payer: Self-pay | Admitting: Internal Medicine

## 2023-04-05 LAB — CBC
HCT: 37.1 % — ABNORMAL LOW (ref 39.0–52.0)
Hemoglobin: 12.2 g/dL — ABNORMAL LOW (ref 13.0–17.0)
MCH: 32.2 pg (ref 26.0–34.0)
MCHC: 32.9 g/dL (ref 30.0–36.0)
MCV: 97.9 fL (ref 80.0–100.0)
Platelets: 122 10*3/uL — ABNORMAL LOW (ref 150–400)
RBC: 3.79 MIL/uL — ABNORMAL LOW (ref 4.22–5.81)
RDW: 13.5 % (ref 11.5–15.5)
WBC: 4.8 10*3/uL (ref 4.0–10.5)
nRBC: 0 % (ref 0.0–0.2)

## 2023-04-05 LAB — BASIC METABOLIC PANEL
Anion gap: 10 (ref 5–15)
BUN: 13 mg/dL (ref 8–23)
CO2: 27 mmol/L (ref 22–32)
Calcium: 8.8 mg/dL — ABNORMAL LOW (ref 8.9–10.3)
Chloride: 103 mmol/L (ref 98–111)
Creatinine, Ser: 1.04 mg/dL (ref 0.61–1.24)
GFR, Estimated: >60 mL/min (ref >60)
Glucose, Bld: 86 mg/dL (ref 70–99)
Potassium: 4.4 mmol/L (ref 3.5–5.1)
Sodium: 140 mmol/L (ref 135–145)

## 2023-04-05 MED ORDER — OXYCODONE-ACETAMINOPHEN 5-325 MG PO TABS
1.0000 | ORAL_TABLET | Freq: Every day | ORAL | 0 refills | Status: AC | PRN
Start: 2023-04-05 — End: 2023-04-11

## 2023-04-05 MED ORDER — QUETIAPINE FUMARATE 100 MG PO TABS: 100.0000 mg | ORAL_TABLET | Freq: Every day | ORAL | Status: AC

## 2023-04-05 MED ORDER — GABAPENTIN 300 MG PO CAPS: ORAL_CAPSULE | ORAL | Status: AC

## 2023-04-05 NOTE — Progress Notes (Signed)
Patient ready for discharge to home; wife present at bedside; oxygen tank delivered and ready for use to travel home; discharge instructions given and reviewed; Rx sent electronically; patient discharged out via wheelchair accompanied home by his wife.

## 2023-04-05 NOTE — Progress Notes (Signed)
Occupational Therapy Treatment Patient Details Name: Gabriel Dillon MRN: [id number] DOB: 08/06/1956 Today's Date: 04/05/2023   History of present illness The pt is a 67 yo male presenting 8/15 after an unwitnessed fall in which he struck the L frontal part of his head. Imaging without acute imaging, work up for seizures and syncope. PMH includes: Parkinson's, anxiety, back pain, gout, sleep apnea, and cataracts.   OT comments  Pt. Seen for skilled OT treatment session.  Pt. Able to complete bed mobility min a.  Lb dressing S seated.  Safe use of stand up Rolator.  Good recall of brake use without cues.  Agree with d/c recommendations. Cont. With acute ot poc while here.        If plan is discharge home, recommend the following:  A little help with walking and/or transfers;A little help with bathing/dressing/bathroom   Equipment Recommendations  Other (comment)    Recommendations for Other Services      Precautions / Restrictions Precautions Precautions: Fall Precaution Comments: falls 3-4x/day       Mobility Bed Mobility Overal bed mobility: Min A  Bed Mobility: Supine to Sit     Supine to sit: Min a hob flat, no rails states wife assists at home with trunk support          Transfers Overall transfer level: Needs assistance Equipment used:  (stand up rollator) Transfers: Sit to/from Stand, Bed to chair/wheelchair/BSC Sit to Stand: Contact guard assist     Step pivot transfers: Contact guard assist, Min assist     General transfer comment: no lob noted with use of stand up rolator, pt. tolerated well and states it did provide good support     Balance                                           ADL either performed or assessed with clinical judgement   ADL Overall ADL's : Needs assistance/impaired                     Lower Body Dressing: Supervision/safety;Sitting/lateral leans Lower Body Dressing Details (indicate cue type and  reason): able to turn in bed each side to bring leg into bed sideways to reach feet for lb dressing/sock management demo Toilet Transfer: Minimal assistance;Ambulation Toilet Transfer Details (indicate cue type and reason): simulated         Functional mobility during ADLs: Minimal assistance General ADL Comments: pt. with good recall and management of upright rollator. managment of breaks without cues. wanting to demo how he turns and sits when needed.    Extremity/Trunk Assessment              Vision       Perception     Praxis      Cognition Arousal: Alert Behavior During Therapy: WFL for tasks assessed/performed Overall Cognitive Status: Within Functional Limits for tasks assessed                                          Exercises      Shoulder Instructions       General Comments  Worked for [employer] and loved it. Had to stop due to back injury.      Pertinent Vitals/ Pain  Pain Assessment Pain Assessment: No/denies pain  Home Living                                          Prior Functioning/Environment              Frequency  Min 1X/week        Progress Toward Goals  OT Goals(current goals can now be found in the care plan section)  Progress towards OT goals: Progressing toward goals     Plan      Co-evaluation                 AM-PAC OT "6 Clicks" Daily Activity     Outcome Measure   Help from another person eating meals?: A Little Help from another person taking care of personal grooming?: A Little Help from another person toileting, which includes using toliet, bedpan, or urinal?: A Little Help from another person bathing (including washing, rinsing, drying)?: A Lot Help from another person to put on and taking off regular upper body clothing?: A Little Help from another person to put on and taking off regular lower body clothing?: A Little 6 Click Score: 17    End of Session  Equipment Utilized During Treatment: Gait belt;Other (comment) (stand up rollator)  OT Visit Diagnosis: Unsteadiness on feet (R26.81);Muscle weakness (generalized) (M62.81);Repeated falls (R29.6)   Activity Tolerance Patient tolerated treatment well   Patient Left in chair;with call bell/phone within reach;with chair alarm set   Nurse Communication Other (comment) (rn states ok to work with pt., spoke with rn and cna at end of session reviewing chair alarm on pt. up in recliner)        Time: 1610-9604 OT Time Calculation (min): 19 min  Charges: OT General Charges $OT Visit: 1 Visit OT Treatments $Self Care/Home Management : 8-22 mins  Boneta Lucks, COTA/L Acute Rehabilitation [phone]   Alessandra Bevels Lorraine-COTA/L 04/05/2023, 11:14 AM

## 2023-04-05 NOTE — Discharge Summary (Signed)
Name: Gabriel Dillon MRN: [id number] DOB: [birth year] 67 y.o. PCP: Pcp, No  Date of Admission: 04/03/2023 12:12 PM Date of Discharge: 04/05/2023 Attending Physician: Dr. Mayford Knife  Discharge Diagnosis: Principal Problem:   Syncope    Discharge Medications: Allergies as of 04/05/2023       Reactions   Bee Venom Anaphylaxis   Motrin [ibuprofen] Anaphylaxis   Penicillins Anaphylaxis   Prednisone Palpitations   Celexa [citalopram] Other (See Comments)   Dizziness    Depakote [valproic Acid] Other (See Comments)   Drowsiness    Aspirin Palpitations   Cymbalta [duloxetine Hcl] Swelling, Anxiety   Percocet [oxycodone-acetaminophen] Rash   Pt will take PRN, rarely.        Medication List     STOP taking these medications    gabapentin 600 MG tablet Commonly known as: NEURONTIN Replaced by: gabapentin 300 MG capsule       TAKE these medications    acetaminophen 500 MG tablet Commonly known as: TYLENOL Take 1,000 mg by mouth every 6 (six) hours as needed for moderate pain, fever or headache.   Baclofen 5 MG Tabs Take 5 mg by mouth 2 (two) times daily.   carbidopa-levodopa 25-250 MG tablet Commonly known as: SINEMET IR Take 2 tablets by mouth 3 (three) times daily.   clonazePAM 0.5 MG tablet Commonly known as: KLONOPIN Take 0.5 mg by mouth 5 (five) times daily.   donepezil 5 MG tablet Commonly known as: ARICEPT Take 5 mg by mouth daily.   gabapentin 300 MG capsule Commonly known as: NEURONTIN Take 2 capsules (600 mg total) by mouth at bedtime AND 2 capsules (600 mg total) 2 (two) times daily with breakfast and lunch. Replaces: gabapentin 600 MG tablet   meloxicam 15 MG tablet Commonly known as: MOBIC Take 15 mg by mouth daily.   oxyCODONE-acetaminophen 5-325 MG tablet Commonly known as: Percocet Take 1 tablet by mouth daily as needed for up to 6 days for severe pain.   QUEtiapine 100 MG tablet Commonly known as: SEROQUEL Take 1 tablet (100 mg  total) by mouth at bedtime.   rOPINIRole 2 MG tablet Commonly known as: REQUIP Take 4 mg by mouth at bedtime.   traMADol 50 MG tablet Commonly known as: ULTRAM Take 50 mg by mouth 2 (two) times daily as needed for moderate pain.   venlafaxine XR 75 MG 24 hr capsule Commonly known as: EFFEXOR-XR Take 75 mg by mouth daily.   VITAMIN B-12 PO Take 1 tablet by mouth daily.   VITAMIN C PO Take 1 tablet by mouth daily.        Disposition and follow-up:   Gabriel Dillon was discharged from [facility] in Stable condition.  At the hospital follow up visit please address:  1.  Follow-up:  a.  Syncope: Patient did not have any further syncopal episodes during hospitalization.  Continue follow-up orthostatics outpatient as orthostatics were positive during hospitalization.  Ensure patient follows up and receives heart monitor given patient states he will follow-up with [facility] cardiology for monitoring.    b.  Polypharmacy: Patient is on many centrally acting medications which could be contributing. Patient discharged on decreased dose of gabapentin. Continue to adjust medications outpatient    c. Forehead Laceration: Patient suturing on forehead is dissolvable. Ensure sutures come out and laceration heals well.   2.  Labs / imaging needed at time of follow-up: BMP and CBC   3.  Pending labs/ test needing follow-up: N/A  4.  Medication Changes  Decreased dose of gabapentin on discharge. Gave 6 days of percocet as patient does not have any more at home and PDMP revealed the same.   Follow-up Appointments:  Follow-up Information     [facility]. Schedule an appointment as soon as possible for a visit.   Specialty: Rehabilitation Contact information: [address] [place] [address] [phone]                Hospital Course by problem list:  #Syncope  #Fall  #Left forehead  laceration- sutured.   Presented with fall after syncope, no prodromal symptoms of dizzy, light headed, or vision changes. Reports LOC for couple of seconds and waking up to pool of blood from his left forehead laceration. No post-ictal state. Denied bowel or bladder incontinence. Differentials include vasovagal vs cardiogenic vs seizure vs orthostatics vs. autonomic dysfunction d/t parkinson's. Reports multiple of falls everyday at home where sometimes his walker gets away from him, but no LOC before. Wife does note periods of staring spells over the course of month that lasts for few seconds. Patient is also on many centrally acting agents at a high dose that includes Klonopin, Gabapentin, Seroquel and Carbidopa levodopa.  EEG with no subclinical seizures or epileptiform discharges.  Patient had orthostatics which were positive during hospitalization.  Syncope was thought to be related to autonomic dysfunction and orthostatic hypotension.  Do wonder rule out cardiac arrhythmia.  There was plan to have patient leave on cardiac monitoring, but patient and wife decided they will get cardiac monitor from [facility] cardiology who patient is followed by.  PT/OT worked with patient and recommended new rollator.  They also recommended outpatient physical therapy in which patient referred to.  Patient encouraged to follow-up with PCP and cardiology which she states he will.  Patient did have echocardiogram which did not reveal any valvular etiology of syncope.  Patient discharged with outpatient PT/OT.   #AKI Patient presented with elevated creatinine at 1.30.  Did give patient fluids and creatinine back down to baseline.  Likely prerenal in the setting of dehydration.  Patient discharged with normal creatinine.    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  No acute  injuries regarding neck and back pain during hospitalization.  Did resume patient's home gabapentin and a decreased dose during hospitalization.  Held baclofen and meloxicam to prevent further centrally acting medication contributing to syncope.  Resumed home tramadol, baclofen, and decreased dose of gabapentin on discharge.  Patient did receive 1 dose of Sublimaze in the ED.    #Parkinson's disease #Restless leg syndrome Patient follows at [facility] with Dr. Malvin Johns, neurology. Patient takes home medication Sinemet 25-250 mg, 2 tablets every 2-3 hours, Aricept 5 mg at night and Requip 2 mg at night.  We continue these medications during hospitalization.   #Severe anxiety Patient continued on home clonazepam 0.5 mg 5 times daily and Seroquel 100 mg nightly.  No acute concerns during hospitalization.  Discharge Subjective: Patient states he is doing well.  He states he is ready to go home.  He denies any lightheadedness or dizziness.  He denies any chest pain or shortness of breath.  He does report having some left-sided headache given this is where he had stitches.  No other concerns this morning.  He is unsure about talking about the cardiac monitor  yesterday, but clarified with wife on the phone today.  Patient states he is ready to go home, he is medically ready to go home.  Discharge Exam:   BP (!) 110/55 (BP Location: Right Arm)   Pulse (!) 47   Temp 97.8 F (36.6 C) (Oral)   Resp 19   Ht 5\' 8"  (1.727 m)   Wt 117.9 kg   SpO2 (!) 79%   BMI 39.53 kg/m  Constitutional: Resting in bed, no acute distress HENT: Left forehead laceration with stitches in place and bandage covering Cardiovascular: regular rate and rhythm, no m/r/g Pulmonary/Chest: normal work of breathing on room air, lungs clear to auscultation bilaterally Abdominal: soft, non-tender, non-distended MSK: normal bulk and tone Neurological: No focal neurological deficits  Pertinent Labs, Studies, and Procedures:     Latest Ref  Rng & Units 04/05/2023    3:15 AM 04/04/2023    6:59 AM 04/03/2023    1:29 PM  CBC  WBC 4.0 - 10.5 K/uL 4.8  4.3  5.2   Hemoglobin 13.0 - 17.0 g/dL 40.9  81.1  91.4   Hematocrit 39.0 - 52.0 % 37.1  37.9  37.2   Platelets 150 - 400 K/uL 122  130  141        Latest Ref Rng & Units 04/05/2023    3:15 AM 04/04/2023    3:39 AM 04/03/2023    1:29 PM  CMP  Glucose 70 - 99 mg/dL 86  67  96   BUN 8 - 23 mg/dL 13  16  18    Creatinine 0.61 - 1.24 mg/dL 7.82  9.56  2.13   Sodium 135 - 145 mmol/L 140  138  139   Potassium 3.5 - 5.1 mmol/L 4.4  4.3  5.2   Chloride 98 - 111 mmol/L 103  104  103   CO2 22 - 32 mmol/L 27  24  27    Calcium 8.9 - 10.3 mg/dL 8.8  8.6  9.0     ECHOCARDIOGRAM COMPLETE  Result Date: 04/04/2023    ECHOCARDIOGRAM REPORT   Patient Name:   Gabriel Dillon Date of Exam: 04/04/2023 Medical Rec #:  [id number]       Height:       68.0 in Accession #:    [id number]      Weight:       260.0 lb Date of Birth:  Jul 03, 1956       BSA:          2.285 m Patient Age:    67 years        BP:           185/85 mmHg Patient Gender: M               HR:           59 bpm. Exam Location:  Inpatient Procedure: 2D Echo, Intracardiac Opacification Agent, Cardiac Doppler and Color            Doppler Indications:    Syncope  History:        Patient has prior history of Echocardiogram examinations, most                 recent 11/18/2015. Signs/Symptoms:Syncope, Chest Pain and                 Palpitation; Risk Factors:Sleep Apnea and Former Smoker.                 Parkinson Disease.  Sonographer:  Raeford Razor RDCS Referring Phys: [id number] JOSHUA ZAVITZ  Sonographer Comments: Technically difficult study due to poor echo windows and no subcostal window. Image acquisition challenging due to patient body habitus. IMPRESSIONS  1. Left ventricular ejection fraction, by estimation, is 60 to 65%. The left ventricle has normal function. The left ventricle has no regional wall motion abnormalities. Left ventricular diastolic  parameters are indeterminate.  2. Right ventricular systolic function is normal. The right ventricular size is normal.  3. The mitral valve is normal in structure. Trivial mitral valve regurgitation.  4. The aortic valve is normal in structure. Aortic valve regurgitation is not visualized. FINDINGS  Left Ventricle: Left ventricular ejection fraction, by estimation, is 60 to 65%. The left ventricle has normal function. The left ventricle has no regional wall motion abnormalities. Definity contrast agent was given IV to delineate the left ventricular  endocardial borders. The left ventricular internal cavity size was normal in size. There is no left ventricular hypertrophy. Left ventricular diastolic parameters are indeterminate. Right Ventricle: The right ventricular size is normal. Right vetricular wall thickness was not assessed. Right ventricular systolic function is normal. Left Atrium: Left atrial size was normal in size. Right Atrium: Right atrial size was normal in size. Pericardium: There is no evidence of pericardial effusion. Mitral Valve: The mitral valve is normal in structure. Trivial mitral valve regurgitation. Tricuspid Valve: The tricuspid valve is normal in structure. Tricuspid valve regurgitation is trivial. Aortic Valve: The aortic valve is normal in structure. Aortic valve regurgitation is not visualized. Aortic valve peak gradient measures 8.0 mmHg. Pulmonic Valve: The pulmonic valve was normal in structure. Pulmonic valve regurgitation is not visualized. Aorta: The aortic root and ascending aorta are structurally normal, with no evidence of dilitation. IAS/Shunts: No atrial level shunt detected by color flow Doppler.  LEFT VENTRICLE PLAX 2D LVIDd:         5.60 cm      Diastology LVIDs:         3.70 cm      LV e' medial:    6.42 cm/s LV PW:         1.10 cm      LV E/e' medial:  15.2 LV IVS:        1.00 cm      LV e' lateral:   5.98 cm/s LVOT diam:     2.00 cm      LV E/e' lateral: 16.4 LV SV:          73 LV SV Index:   32 LVOT Area:     3.14 cm  LV Volumes (MOD) LV vol d, MOD A2C: 101.0 ml LV vol d, MOD A4C: 150.0 ml LV vol s, MOD A2C: 36.8 ml LV vol s, MOD A4C: 51.7 ml LV SV MOD A2C:     64.2 ml LV SV MOD A4C:     150.0 ml LV SV MOD BP:      85.7 ml LEFT ATRIUM             Index LA diam:        3.60 cm 1.58 cm/m LA Vol (A2C):   57.2 ml 25.03 ml/m LA Vol (A4C):   43.9 ml 19.21 ml/m LA Biplane Vol: 52.9 ml 23.15 ml/m  AORTIC VALVE AV Area (Vmax): 2.36 cm AV Vmax:        141.00 cm/s AV Peak Grad:   8.0 mmHg LVOT Vmax:      106.00 cm/s LVOT Vmean:  66.900 cm/s LVOT VTI:       0.232 m  AORTA Ao Root diam: 3.00 cm Ao Asc diam:  3.80 cm MITRAL VALVE MV Area (PHT): 4.68 cm    SHUNTS MV Decel Time: 162 msec    Systemic VTI:  0.23 m MV E velocity: 97.90 cm/s  Systemic Diam: 2.00 cm MV A velocity: 75.20 cm/s MV E/A ratio:  1.30 Dietrich Pates MD Electronically signed by Dietrich Pates MD Signature Date/Time: 04/04/2023/3:44:23 PM    Final    EEG adult  Result Date: 04/04/2023 Charlsie Quest, MD     04/04/2023  8:22 AM Patient Name: Gabriel Dillon MRN: [id number] Epilepsy Attending: Charlsie Quest Referring Physician/Provider: Chauncey Mann, DO Date: 04/04/2023 Duration: 44.24 mins Patient history: 67yo M with syncope getting eeg to evaluate for seizure. Level of alertness: Awake AEDs during EEG study: Clonazepam, GBP Technical aspects: This EEG study was done with scalp electrodes positioned according to the 10-20 International system of electrode placement. Electrical activity was reviewed with band pass filter of 1-70Hz , sensitivity of 7 uV/mm, display speed of 29mm/sec with a 60Hz  notched filter applied as appropriate. EEG data were recorded continuously and digitally stored.  Video monitoring was available and reviewed as appropriate. Description: The posterior dominant rhythm consists of 6 Hz activity of moderate voltage (25-35 uV) seen predominantly in posterior head regions, symmetric and reactive to  eye opening and eye closing. EEG showed continuous generalized 3 to 6 Hz theta-delta slowing. Hyperventilation and photic stimulation were not performed.   ABNORMALITY - Continuous slow, generalized - Background slow IMPRESSION: This study is suggestive of moderate diffuse encephalopathy, nonspecific etiology. No seizures or epileptiform discharges were seen throughout the recording. Charlsie Quest   CT Head Wo Contrast  Result Date: 04/03/2023 CLINICAL DATA:  Provided history: Head trauma, moderate/severe. Fall. Hematoma. EXAM: CT HEAD WITHOUT CONTRAST CT MAXILLOFACIAL WITHOUT CONTRAST CT CERVICAL SPINE WITHOUT CONTRAST TECHNIQUE: Multidetector CT imaging of the head, cervical spine, and maxillofacial structures were performed using the standard protocol without intravenous contrast. Multiplanar CT image reconstructions of the cervical spine and maxillofacial structures were also generated. RADIATION DOSE REDUCTION: This exam was performed according to the departmental dose-optimization program which includes automated exposure control, adjustment of the mA and/or kV according to patient size and/or use of iterative reconstruction technique. COMPARISON:  Head CT 05/10/2019. FINDINGS: CT HEAD FINDINGS Brain: No age advanced or lobar predominant parenchymal atrophy. There is no acute intracranial hemorrhage. No demarcated cortical infarct. No extra-axial fluid collection. No evidence of an intracranial mass. No midline shift. Vascular: No hyperdense vessel.  Atherosclerotic calcifications. Skull: No calvarial fracture or aggressive osseous lesion. CT MAXILLOFACIAL FINDINGS Osseous: No acute maxillofacial fracture is identified. Orbits: No acute orbital finding. Sinuses: No significant paranasal sinus disease. Soft tissues: Subtle left forehead/periorbital soft tissue swelling is questioned. CT CERVICAL SPINE FINDINGS Alignment: Mild dextrocurvature of the cervical spine. Nonspecific straightening of the  expected cervical lordosis. No significant spondylolisthesis. Skull base and vertebrae: The basion-dental and atlanto-dental intervals are maintained.No evidence of acute fracture to the cervical spine. The C1 anterior arch is fused to the skull base. C3-C4 vertebral ankylosis. 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 Soft tissues and spinal canal: No prevertebral fluid or swelling. No visible canal hematoma. Disc levels: Cervical spondylosis with multilevel disc space narrowing, disc bulges/central disc protrusions, posterior osteophyte complexes, endplate spurring, uncovertebral hypertrophy and facet arthrosis. 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 Multilevel spinal canal narrowing. Most notably, multifactorial spinal  canal stenosis appears at least moderate in severity at C5-C6 and C6-C7. Multilevel bony neural foraminal narrowing. Multilevel small ventral osteophytes. Upper chest: Within limitations of motion degradation, there is no consolidation within the imaged lung apices. No visible pneumothorax. IMPRESSION: CT head: No evidence of an acute intracranial abnormality. CT maxillofacial: 1. No evidence of an acute maxillofacial fracture. 2. Subtle left forehead/periorbital soft tissue swelling is questioned. Correlate with physical exam findings. CT cervical spine: 1. No evidence of an acute cervical spine fracture. 2. Nonspecific straightening of the expected cervical lordosis. 3. Mild dextrocurvature of the cervical spine. 4. 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 Multilevel spinal canal narrowing. 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 Multilevel bony neural foraminal narrowing. 5. 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 6. C3-C4 vertebral ankylosis. Electronically Signed   By: Jackey Loge D.O.   On: 04/03/2023 16:39    CT Cervical Spine Wo Contrast  Result Date: 04/03/2023 CLINICAL DATA:  Provided history: Head trauma, moderate/severe. Fall. Hematoma. EXAM: CT HEAD WITHOUT CONTRAST CT MAXILLOFACIAL WITHOUT CONTRAST CT CERVICAL SPINE WITHOUT CONTRAST TECHNIQUE: Multidetector CT imaging of the head, cervical spine, and maxillofacial structures were performed using the standard protocol without intravenous contrast. Multiplanar CT image reconstructions of the cervical spine and maxillofacial structures were also generated. RADIATION DOSE REDUCTION: This exam was performed according to the departmental dose-optimization program which includes automated exposure control, adjustment of the mA and/or kV according to patient size and/or use of iterative reconstruction technique. COMPARISON:  Head CT 05/10/2019. FINDINGS: CT HEAD FINDINGS Brain: No age advanced or lobar predominant parenchymal atrophy. There is no acute intracranial hemorrhage. No demarcated cortical infarct. No extra-axial fluid collection. No evidence of an intracranial mass. No midline shift. Vascular: No hyperdense vessel.  Atherosclerotic calcifications. Skull: No calvarial fracture or aggressive osseous lesion. CT MAXILLOFACIAL FINDINGS Osseous: No acute maxillofacial fracture is identified. Orbits: No acute orbital finding. Sinuses: No significant paranasal sinus disease. Soft tissues: Subtle left forehead/periorbital soft tissue swelling is questioned. CT CERVICAL SPINE FINDINGS Alignment: Mild dextrocurvature of the cervical spine. Nonspecific straightening of the expected cervical lordosis. No significant spondylolisthesis. Skull base and vertebrae: The basion-dental and atlanto-dental intervals are maintained.No evidence of acute fracture to the cervical spine. The C1 anterior arch is fused to the skull base. C3-C4 vertebral ankylosis. 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 Soft tissues and spinal canal: No prevertebral fluid or  swelling. No visible canal hematoma. Disc levels: Cervical spondylosis with multilevel disc space narrowing, disc bulges/central disc protrusions, posterior osteophyte complexes, endplate spurring, uncovertebral hypertrophy and facet arthrosis. 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 Multilevel spinal canal narrowing. 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 Multilevel bony neural foraminal narrowing. Multilevel small ventral osteophytes. Upper chest: Within limitations of motion degradation, there is no consolidation within the imaged lung apices. No visible pneumothorax. IMPRESSION: CT head: No evidence of an acute intracranial abnormality. CT maxillofacial: 1. No evidence of an acute maxillofacial fracture. 2. Subtle left forehead/periorbital soft tissue swelling is questioned. Correlate with physical exam findings. CT cervical spine: 1. No evidence of an acute cervical spine fracture. 2. Nonspecific straightening of the expected cervical lordosis. 3. Mild dextrocurvature of the cervical spine. 4. 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 Multilevel spinal canal narrowing. Most notably, multifactorial spinal canal stenosis appears at least  moderate in severity at C5-C6 and C6-C7. Multilevel bony neural foraminal narrowing. 5. 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 6. C3-C4 vertebral ankylosis. Electronically Signed   By: Jackey Loge D.O.   On: 04/03/2023 16:39   CT Maxillofacial Wo Contrast  Result Date: 04/03/2023 CLINICAL DATA:  Provided history: Head trauma, moderate/severe. Fall. Hematoma. EXAM: CT HEAD WITHOUT CONTRAST CT MAXILLOFACIAL WITHOUT CONTRAST CT CERVICAL SPINE WITHOUT CONTRAST TECHNIQUE: Multidetector CT imaging of the head, cervical spine, and maxillofacial structures were performed using the standard protocol without intravenous  contrast. Multiplanar CT image reconstructions of the cervical spine and maxillofacial structures were also generated. RADIATION DOSE REDUCTION: This exam was performed according to the departmental dose-optimization program which includes automated exposure control, adjustment of the mA and/or kV according to patient size and/or use of iterative reconstruction technique. COMPARISON:  Head CT 05/10/2019. FINDINGS: CT HEAD FINDINGS Brain: No age advanced or lobar predominant parenchymal atrophy. There is no acute intracranial hemorrhage. No demarcated cortical infarct. No extra-axial fluid collection. No evidence of an intracranial mass. No midline shift. Vascular: No hyperdense vessel.  Atherosclerotic calcifications. Skull: No calvarial fracture or aggressive osseous lesion. CT MAXILLOFACIAL FINDINGS Osseous: No acute maxillofacial fracture is identified. Orbits: No acute orbital finding. Sinuses: No significant paranasal sinus disease. Soft tissues: Subtle left forehead/periorbital soft tissue swelling is questioned. CT CERVICAL SPINE FINDINGS Alignment: Mild dextrocurvature of the cervical spine. Nonspecific straightening of the expected cervical lordosis. No significant spondylolisthesis. Skull base and vertebrae: The basion-dental and atlanto-dental intervals are maintained.No evidence of acute fracture to the cervical spine. The C1 anterior arch is fused to the skull base. C3-C4 vertebral ankylosis. 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 Soft tissues and spinal canal: No prevertebral fluid or swelling. No visible canal hematoma. Disc levels: Cervical spondylosis with multilevel disc space narrowing, disc bulges/central disc protrusions, posterior osteophyte complexes, endplate spurring, uncovertebral hypertrophy and facet arthrosis. 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 Multilevel spinal canal narrowing. Most notably, multifactorial spinal canal stenosis  appears at least moderate in severity at C5-C6 and C6-C7. Multilevel bony neural foraminal narrowing. Multilevel small ventral osteophytes. Upper chest: Within limitations of motion degradation, there is no consolidation within the imaged lung apices. No visible pneumothorax. IMPRESSION: CT head: No evidence of an acute intracranial abnormality. CT maxillofacial: 1. No evidence of an acute maxillofacial fracture. 2. Subtle left forehead/periorbital soft tissue swelling is questioned. Correlate with physical exam findings. CT cervical spine: 1. No evidence of an acute cervical spine fracture. 2. Nonspecific straightening of the expected cervical lordosis. 3. Mild dextrocurvature of the cervical spine. 4. 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 Multilevel spinal canal narrowing. 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 Multilevel bony neural foraminal narrowing. 5. 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 6. C3-C4 vertebral ankylosis. Electronically Signed   By: Jackey Loge D.O.   On: 04/03/2023 16:39   DG Chest Portable 1 View  Result Date: 04/03/2023 CLINICAL DATA:  Pain after fall EXAM: PORTABLE CHEST 1 VIEW COMPARISON:  Ultrasound 04/07/2022 FINDINGS: Underinflation. There is some mild parenchymal opacity seen along the lung bases, left-greater-than-right. No pneumothorax or effusion. No edema. Normal cardiopericardial silhouette. Overlapping cardiac leads. IMPRESSION: Underinflation. Mild basilar opacity on the left-greater-than-right. Subtle infiltrate is not excluded. Recommend follow-up Electronically Signed   By: Karen Kays M.D.   On: 04/03/2023 15:21     Discharge Instructions:  Discharge Instructions     Ambulatory referral to Occupational Therapy   Complete by: As directed    Ambulatory referral to Physical Therapy   Complete by: As directed    Call MD  for:  persistant nausea and vomiting   Complete by: As directed    Call MD for:  redness, tenderness, or signs of infection (pain, swelling, redness, odor or green/yellow discharge around incision site)   Complete by: As directed    Call MD for:  severe uncontrolled pain   Complete by: As directed    Call MD for:  temperature >100.4   Complete by: As directed    Diet - low sodium heart healthy   Complete by: As directed    Discharge instructions   Complete by: As directed    Mr. Gabriel Dillon,  It was a pleasure taking care of you at [facility]. You were admitted for episodes of passing out.  We are thinking this is related to decreased blood pressure when standing up.  We are discharging you home now that you are doing better. Please follow the following instructions.   1) It is likely you had a episode of passing out because you were standing up too fast got dizzy and passed out.  During your hospital stay, we did check orthostatic vital signs which showed that your blood pressure does drop when you are standing up.  This could be leading you to becoming lightheaded.  Please take your time standing up, gather your feet, and then start walking.  We are decreasing your dose of gabapentin as it could be leading to you feeling that you are about to pass out. Since you do not have any Percocet at home, I will send you home with 6 tablets. Please use sparingly. You can use tylenol up to 800 mg every 8 hours.   2) Sometimes patients can pass out due to heart arrhythmias.  To rule this out, we did offer you a heart monitor, but you stated that you would like to get one from your [facility] cardiologist, please follow-up with your primary care doctor as well as your [facility] cardiologist to get heart monitor.  3) Regarding your falls, we are providing you information for outpatient therapy as well as obtaining a rollator to help keep your balance.  Please obtain your rollator outpatient.  You will be called  for your therapy sessions.  Remember to take caution when you stand up and take your time with this.  4) Regarding your stitches, they should fall out on their own.  Can take Tylenol for pain control.  Do not overtake your Percocet.  Take care,  Dr. Modena Slater, DO   Increase activity slowly   Complete by: As directed    No wound care   Complete by: As directed        Signed: Modena Slater, DO 04/05/2023, 1:20 PM   Pager: [phone]

## 2023-08-22 ENCOUNTER — Other Ambulatory Visit: Payer: Self-pay | Admitting: Orthopedic Surgery

## 2023-08-22 DIAGNOSIS — S46002A Unspecified injury of muscle(s) and tendon(s) of the rotator cuff of left shoulder, initial encounter: Secondary | ICD-10-CM

## 2023-08-22 DIAGNOSIS — M25312 Other instability, left shoulder: Secondary | ICD-10-CM

## 2023-08-25 ENCOUNTER — Ambulatory Visit
Admission: RE | Admit: 2023-08-25 | Discharge: 2023-08-25 | Disposition: A | Discharge status: Home / Self Care | Payer: Medicare PPO | Source: Ambulatory Visit | Attending: Orthopedic Surgery | Admitting: Orthopedic Surgery

## 2023-08-25 DIAGNOSIS — M25312 Other instability, left shoulder: Secondary | ICD-10-CM | POA: Insufficient documentation

## 2023-08-25 DIAGNOSIS — S46002A Unspecified injury of muscle(s) and tendon(s) of the rotator cuff of left shoulder, initial encounter: Secondary | ICD-10-CM | POA: Diagnosis present

## 2024-02-11 NOTE — Progress Notes (Signed)
 Today the history is gathered from: 30% - patient  70% - patient's wife again   REFERRING PHYSICIAN: Lane Arthea Locus, MD PRIMARY CARE PHYSICIAN:  Gayle Verneita Molt, NP  Records summary: Patient saw [facility] neurology on 12/26/21. They plan to check syn-one skin biopsy to evaluate for alpha-synucleinopathy ((which would be Parkinson's Disease, Lewy Body Dementia, multiple system atrophy, and pure autonomic failure).  Can also consider DaTScan to evaluate for dopaminergic deficiency.) given it is unclear if pt has Parkinson's given his inconsistent reported response to levodopa .  IMPRESSION/PLAN  Gabriel Dillon is a 68 y.o. male presenting for evaluation of  PARKINSON'S DISEASE/ MEMORY LOSS/ DIFFICULTY WALKING/ FALLS/ HYPOPHONIA/ ANXIETY/ SLEEP DISORDER/ RESTLESS LEGS /  LOW BACK PAIN / NECK PAIN - Ongoing. - Patient with unchanged PD. Worsening hypophonia. Ongoing lumbar back pain, 7/10 pain worsens with exertion. No new memory concerns, ongoing word finding difficulty. No difficulty with complex tools and devices. Visual and auditory hallucinations occurring daily, not distressing. Frequent falls, falling six times daily, related to imbalance and lower extremity weakness. Ambulating with walker at all times. No restless legs at night. Sleep is well. - Memory evaluation today is 27/28. Repeat in 6 months. - Referral to Speech Therapy to consult hypophonia and increase projection and sound of voice. - Will send Seroquel  and Clonazepam  to [facility] at [facility], [place]. - Continue Seroquel  100 mg at night. Refill. - Continue Clonazepam  0.5 mg five times daily. Refill. - Continue Gabapentin  600 mg in the morning and 1200 mg at night. Refill. - Increase Sinemet  25-250 mg to 3 pills five times daily. Patient will call for refills. - Continue Requip  2 mg at night. Refill. - Continue Tramadol  50 mg twice daily. Refill.  - Continue Effexor  XR 75 mg daily. Refill.  - Continue Aricept  10 mg at night. - Recommend  PT for gait and balance given extremity weakness and high risk for fall, patient declines. Call our office if interested. - Encouraged patient to stay physically active and exercise on regular basis (90 mins per week or 15 mins per day). Exercise can be very beneficial in many neurological conditions.   Over 40 minutes of physician time has been spent before, during and after this visit which includes time for precharting and preparing to see the patient, family and/or caregiver, documenting clinical information in the electronic or other health record, independently interpreting results and communicating results to the patient, family and/or caregiver, getting and/or reviewing separately obtained history, ordering medications, tests or procedures, performing a medically appropriate exam and/or evaluation, care coordination, counseling and educating the patient, and referring the patient to and communicating with other health care professionals as needed.    Follow-up with Dr. Lane in 3 months.  P=4  CHIEF COMPLAIN & HISTORY OF PRESENT ILLNESS:  Gabriel Dillon is a 68 y.o. male presenting for evaluation of: Chief Complaint  Patient presents with  . Parkinson's Disease  . Memory Loss  . Unstable Gait  . Fall  . Anxiety  . Sleeping Problem  . Restless Legs  . Back Pain  . Neck Pain   PARKINSON'S DISEASE/ MEMORY LOSS/ DIFFICULTY WALKING/ FALLS/ HYPOPHONIA/ ANXIETY/ SLEEP DISORDER/ RESTLESS LEGS /  LOW BACK PAIN / NECK PAIN Patient with unchanged Parkinson's disease, no new concerns. Worsening hypophonia. Ongoing lumbar back pain, 7/10 pain worsens with exertion. No new memory concerns. Word finding difficulty. No difficulty recalling familiar names, faces and locations. Not misplacing items, no difficulty using complex devices.  Visual and auditory hallucinations occurring daily,  not distressing. Frequent falls, falling six times daily, related to imbalance and lower extremity weakness. Ambulating  with wheelchair today in clinic, will ambulate with walker at home. No difficulty falling or staying asleep. No restless legs at night. Taking Seroquel  100 mg nightly, Gabapentin  600 mg in the morning and 1200 mg at night, Sinemet  25-250 mg 2 pills five times daily, Clonazepam  0.5 mg five times daily, Requip  2 mg at night, Tramadol  50 mg twice daily, Effexor  XR 75 mg nightly, Aricept  10 mg at night. Memory evaluation today is 27/28.  WEIGHT TRACKING: 02/11/2024 - 252 lbs 08/28/2023 - 254 lbs 09/24/2022: 266 lb 08/07/2022: 271 lb 06/05/22: 271 lb 04/18/22: 265 lb 03/05/22: 270 lb 01/01/22: 275 lb 12/26/21: 269 lb   DATA SUMMARY: 04/04/2023 EEG ADULT IMPRESSION:  This study is suggestive of moderate diffuse encephalopathy,  nonspecific etiology. No seizures or epileptiform discharges were  seen throughout the recording.   04/03/2023 CT HEAD WO CT CERVICAL SPINE WO CT MAXILLOFACIAL [id number] maxillofacial:  1. No evidence of an acute maxillofacial fracture.  2. Subtle left forehead/periorbital soft tissue swelling is  questioned. Correlate with physical exam findings.   CT cervical spine:  1. No evidence of an acute cervical spine fracture.  2. Nonspecific straightening of the expected cervical lordosis.  3. Mild dextrocurvature of the cervical spine.  4. 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 Multilevel spinal canal  narrowing. Most notably, multifactorial spinal canal stenosis  appears at least moderate in severity at C5-C6 and C6-C7. Multilevel  bony neural foraminal narrowing.  5. 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  6. C3-C4 vertebral ankylosis.   09/21/21 at [facility]: CT head w/o contrast No acute intracranial abnormality.   06/26/21 at [facility]: MRI L-spine w/wo contrast  Multilevel degenerative spondylosis including central disc protrusion at L5/S1. No high-grade spinal canal stenosis  or neural foraminal stenosis at any level.   06/26/21 at [facility]: MRI T-spine w/wo contrast  Severe bilateral neuroforaminal narrowing at T10-T11. No abnormal cord signal.   06/12/21 at [facility]: MRI L-spine w/o contrast 1. Stable MRI appearance of the lumbar spine since last year.  Chronic interbody ankylosis at L1-L2, and mild chronic L3 superior endplate compression.  2. Stable borderline to mild multifactorial spinal stenosis at L4-L5, and up to mild bilateral lateral recess and foraminal stenosis at L5-S1.    03/19/21 at [facility]: CTA head w/wo contrast  No acute intracranial abnormalities.  Mild chronic small vessel ischemic changes.  Normal CTA head.  03/12/2020 LUMBAR SPINE MRI WO IMPRESSION:  1. Mild subarticular and foraminal narrowing bilaterally at L4-5  secondary to a rightward disc protrusion and bilateral facet  hypertrophy.  2. Shallow central disc protrusion at L5-S1 may contact the  traversing S1 nerve roots bilaterally.  3. Mild foraminal narrowing bilaterally at L5-S1 due to endplate and  facet spurring.    05/10/2019 CT HEAD W AND WO IMPRESSION: Unremarkable CT appearance of the brain and orbits. 04/10/2019 MRI BRAIN WO  IMPRESSION: Unremarkable brain MRI.  No explanation for symptoms.  11/28/2016  MRI LUMBAR SPINE  IMPRESSION: Multilevel lumbar degenerative disc disease with narrowing of the lateral recesses at L4-L5 disc material which contacts the right descending S1 nerve as above.  VISIT SUMMARY: 03/05/22: Ongoing tremors. Reports shuffling feet, stiffness, and quietness of voice is worsening. States short term memory is still decreased, but stable. Advised patient to complete balance exercises  each morning for 5 minutes to improve balance. Demonstrated this for patient. Continue Aricept  5 mg nightly, refilled. Continue Sinemet  25/250 mg 12 pills total per day, refilled. Continued restless legs, well managed with Requip . Notes new  excessive sweating of back.  Continue Gabapentin  600 mg in morning, 600 mg in afternoon, and 1600 mg at night, refilled. Continue Requip  2 mg nightly, refilled. Patient with ongoing anxiety and improved sleep since using CPAP machine. Increase clonazepam  to 0.5 mg four times daily, refilled at [facility]. Continue Seroquel  100 mg nightly.  12/26/21: Memory evaluation today was 28/30.Continue Aricept  5 mg nightly, refilled. Continue Sinemet  25/250 mg to 2 pills at 6 am,1 pill at 8 am, 2 pills at 10 am, 1 pill at 12 pm, 2 pills at 2 pm, 1 pill at 4 pm, 2 pills at 6 pm, and 1 pill at 8 pm (12 pills total per day), refilled. Continue Gabapentin  800 mg in the morning and 1200 mg at night, refilled. Continue Requip  2 mg nightly, refilled. Continue clonazepam  0.5 mg three times daily, refilled at [facility]. Increase Seroquel  to 100mg  nightly (2 pills). Let us  know if this is not giving you better sleep. Can consider Abilify if Seroquel  is not working.   10/25/21: Patient with Parkinson's reporting worsening stiffness, tremors, and slurred speech. Notes memory seems slightly worse as well. Continue Aricept  5 mg nightly, refilled. Increase Sinemet  25/250 mg to 2 pills at 6 am,1 pill at 8 am, 2 pills at 10 am, 1 pill at 12 pm, 2 pills at 2 pm, 1 pill at 4 pm, 2 pills at 6 pm, and 1 pill at 8 pm (12 pills total per day). Continue Gabapentin  800 mg in the morning and 1200 mg at night, refilled. Continue Requip  2 mg nightly, refilled. Continue clonazepam  0.5 mg three times daily, refilled. Continue Seroquel  50 mg nightly, refilled.   08/22/21: He takes Requip  2 mg nightly. MRI of cervical spine without to evaluate for cervical radiculopathy. - Switch Gabapentin  to 800 mg in the morning, and 1200 mg at night. Keep appointment with ENT for inspire device. Refill Clonazepam  0.25 mg 6 times per day. Last memory evaluation was 28/30. Refill Sinemet  25/250 2 tablet 6-7 times per day. Refill Aricept  5 mg at  night.  06/27/2021: - Increase Sinemet  to 2 tablet 25/250 7 times per day. Refill Aricept  5 mg at night. Refill Gabapentin  600 mg twice a day, and 1200 mg at night. Continue taking Requip  2 mg in the evening. Recommend starting Wellbutrin  150 mg XL,at night. Call our office if you would like us  to send this. Switch Clonazepam  to 0.5 mg three time a day. Memory evaluation 28/30.  04/30/21: - Increase Sinemet  25/250 mg two tablets 7 times a day (can do this ever two hours, please set an alarm to remember to do this.) Start Seroquel  25 mg at night for one week then increase to 50 mg nightly and continue this dose.  Continue taking Clonazepam  0.25 mg six times per week. Continue requip  2 mg nightly Continue aricept  (donepezil ) 5 mg nightly, Continue Gabapentin  600 mg in the morning, 600 mg in the afternoon, and 1200 mg nightly.  will put all medications through [facility].   12/05/1020: Symptoms ongoing.   09/25/2020: Symptoms slightly worse. Feels like parkinson's disease and anxiety is worse.  - Refill Sinemet  25/250 1.5 tablets 6 times per day.Refill Requip  2 mg at night.Continue taking Gabapentin  600, 600, 1200 mg. Refilled.  Restart Clonazepam  0.5 mg twice a  day for 1 week, then increase to 0.5 mg three time a day. Continue taking Lexapro 10 mg twice a day.  09/05/2020: Symptoms ongoing. Continue taking gabapentin  600 mg in the morning, 300mg  in the afternoon, 1200 mg at night, refilled. Continue taking Requip  2 mg daily, refilled. Continue taking Sinemet  to 25/250 regular release 1.5 tabs 6 times per day, refilled. Continue taking donepezil  5 mg nighty for memory.  Could consider increasing Sinemet  25/250 RR to 2 tabs 6 times per day. Increase escitalopram to 10 mg twice daily. Continue taking baclofen 5 mg twice daily, refilled.   07/17/2020: Symptoms ongoing.  Increase Sinemet  to 25/250 regular release 1.5 tabs 6 times per day from previous dose of 25/250 1 tab six times per day. Continue taking  Aricept  5 mg nightly, refilled. Continue Requip  2mg  daily for RLS. Change gabapentin  to 600mg  in the morning, 300mg  in the afternoon, 1200mg  at night.  06/19/2020:  Symptoms ongoing. New facial pain. Change Sinemet  to 25/250 regular release 1 tab 6 times per day. Continue taking Aricept  5 mg nightly, refilled. Increase gabapentin  to 600 mg (1 tab) in the morning, 600 mg (1 tab) in the afternoon, and 900 mg (1.5 tabs) at night. Continue taking baclofen 5 mg twice daily, refilled.   05/15/2020:Symptoms ongoing. Continue taking Sinemet  to 25/250 1 tab 5 times per day and 25/250 XR 1 tab at bedtime, refilled. Continue taking Aricept  5 mg nightly, refilled. Increase gabapentin  to 300 mg (1 tab) in the morning, 300 mg (1 tab) in the afternoon, and 900 mg (3 tabs) at night. Start taking Lexapro 10 mg daily. Decrease clonazepam  to 0.5 mg (1/2 tab) three times daily for one month, then decrease to 0.5 mg (1/2 tab) twice daily for one month, then decrease to 0.5 mg (1/2 tab) once daily for one month, then discontinue. We will send a referral to physical therapy for gait and balance and fitting for a walker. Patient would like to go to physical therapy in [place]. Start taking Baclofen 5 mg twice daily for back pain.  04/11/2020: Symptoms ongoing. Change Sinemet  to 25/100 1 tab 5 times per day and 25/100 XR 1 tab at bedtime.  Continue taking Aricept  5 mg nightly, refilled. Discontinue Remeron  due to possible side effect of RLS. Increase gabapentin  to 300 mg in the morning, 300 mg in the afternoon, and 900 mg at night. Decrease clonazepam  to 0.5 mg three times daily for 2 months, then decrease to 0.5 mg twice daily for 2 months, then decrease to 0.5 mg once daily for two months, then discontinue. Discontinue Cymbalta.   03/01/20: Patient with symptoms ongoing. Start Cymbalta 30 mg once daily. Increase gabapentin  to 300 mg in the morning and afternoon and 900 mg at night. Decrease clonazepam  to 1 mg twice daily. Continue  Aricept  5 mg nightly to slow the progression of memory loss, Sinemet  25/250 1 tab six times daily, and Remeron  15 mg as prescribed. Send referral to Dr. Avanell for evaluation of low back pain.  01/31/2020: Symptoms ongoing.  Decrease Sinemet  50/250 to 1 tab 3 times per day. Once you run out of the 50/250 tabs, decrease Sinemet  to 25/100 2 tabs 3 times per day for one week, then decrease to Sinemet  25/100 1 tab 3 times per day. Continue taking Aricept  5 mg nightly, refilled.  We will send a referral to Dr. Avanell at the pain clinic for a second opinion. Continue taking gabapentin  300 mg in the morning, 300 mg in the afternoon, and  600 mg at night, refilled. Continue taking clonazepam  1 mg three times daily, refilled. Continue taking Remeron  15 mg as prescribed.   11/29/2019: Symptoms ongoing. Decrease Sinemet  25/100 to 1 tab 3 times per day. We will send a referral to the weight loss clinic. We will send a referral to a dietician to discuss healthy eating habits and losing weight. Continue taking gabapentin  300 mg in the morning, 300 mg in the afternoon, and 600 mg at night, refilled. Recommend that patient start venlafaxine  XR 150 mg daily for anxiety, as prescribed by his psychiatrist. Continue taking clonazepam  1 mg three times per day, refilled. Continue Remeron  15 mg as prescribed. We will send a referral to Dr. Avanell for low back injections.   10/26/2019: Symptoms worse. SLUMS today is 29/30.  Continue taking Aricept  5 mg nightly, refilled. Continue taking Sinemet  25/250 6 tabs daily, refilled. We will send a referral to cardiology due to shortness of breath when walking. Increase gabapentin  to 300 mg in the morning, 300 mg in the afternoon, and 600 mg at night. Increase clonazepam  1 mg three times daily.  Continue Remeron  15 mg as prescribed.  09/15/19: Patient with Parkinson's, memory loss, and restless legs, worse, and anxiety, ongoing. Start Aricept  5 mg nightly to slow the progression of memory  loss.  Start Depakote 125 mg twice daily for anxiety. Continue Sinemet  25/250 1 tab six times daily (every 3 hours), Requip  2 mg nightly, clonazepam  0.5 mg three times daily, and Remeron  15 mg nightly as prescribed. Increase gabapentin  to 300 mg in the morning and 600 mg nightly. Will complete SLUMS to evaluate cognitive function at next visit.   06/16/19: Patient with Parkinson's and anxiety, ongoing, and RLS, stable. Taking sinemet  25/250 1 tab six times daily (every 3 hours), refilled. Taking Requip  2 mg nightly, refilled.  Taking gabapentin  600 mg at night, refilled.  Increase clonazepam  to 0.5 mg three times per day. Taking Remeron  15 mg 1.5 tabs at night, refilled.   03/16/19: Parkinson's ongoing, restless leg and anxiety worse. Taking Sinemet  50/250 to six times daily and Sinemet  CR 25/100 three times a day, refilled. Taking Requip  2 mg nightly, refilled. Taking Gabapentin  900 mg nightly, refilled. We will send another referral for a sleep study to evaluate sleep difficulty.  Increase Remeron  to 15 mg in the morning and 15 mg at night. Taking clonazepam  0.5 mg in the morning,  0.25 mg on Monday, Wednesday, and Friday at lunch, and 0.25 mg at night, continue this  09/03/2018: Patient with moderate Parkinson's disease, tremors worse in the arms and feet.  Increase Sinemet  50/250 to six times daily. Refer patient to a movement specialist for a brain stimulator consultation.  Taking Gabapentin  1200 mg nightly, refilled. Taking Requip  to 1 mg nightly, refilled. Advised patient to receive a CPAP titration as soon as possible. Taking clonazepam  0.5 mg three times daily, refilled. Refer patient to a new psychiatrist.   07/09/18: Parkinson's, worse. Increase Sinemet  50/250, 1 tab to five times daily (every 3-4 hours). Sleep, worse. Taking Gabapentin  1200 mg nightly, continue taking this. Taking Requip  0.5 mg nightly for sleep and restless legs. Increase Requip  to 1 mg nightly for sleep and restless legs.  Patient will complete a sleep study at [facility] in January. Anxiety, worse. Taking clonazepam  0.5 mg three times daily, continue taking this. Advised patient to continue with psychiatry. Will consider a new referral to psychiatry when patient figures out insurance with new disability approval.  Constipation, improved.   05/07/18: Parknison's  stable. Start Sinemet  50/250, 1 tab four times daily. Refill Gabapentin  1200 mg nightly and Requip  0.5 mg nightly. Advised patient to take dose of Gabapentin  closer to when he is going to sleep. Refill clonazepam  0.5 mg three times daily. Send referral to [facility] for sleep study. Advised patient to drink plenty of fluids and eat foods with fiber to help with constipation.  02/26/18: Parkinson's stable. Refill sinemet  25/100 2 tabs three times daily and 1 tab nightly. Advised patient to drink 50-60 ounces of water daily to help with dizziness. Restless legs/sleep disorder worse. Refill gabapentin  1200 mg nightly. Start Requip  0.5 mg nightly for restless legs. Anxiety worse. Refill clonazepam  0.5 mg three times daily.   10/30/2017: Tremor and anxiety is worse. Increase Sinemet  25/100 mg by taking 2 tabs in the morning, 2 tabs at noon, 2 tabs in the afternoon, and 1 tab at night for tremor. Increase clonazepam  to 0.5 mg three times per day. Con Cymbalta 60 mg daily and Gabapentin  600 mg nightly. Order sleep study. Try magnesium citrate for constipation.     MEDICATIONS Current Outpatient Medications  Medication Sig Dispense Refill  . acetaminophen  (TYLENOL ) 500 MG tablet Take 1,000 mg by mouth every 8 (eight) hours as needed for Pain       . ascorbic acid (VITAMIN C ORAL) Take 500 mg by mouth once daily       . carbidopa -levodopa  (SINEMET ) 25-250 mg tablet Take 2 pills 5 times daily. 300 tablet 1  . carbidopa -levodopa  (SINEMET ) 25-250 mg tablet TAKE 2 TABLETS FIVE TIMES DAILY 600 tablet 5  . clonazePAM  (KLONOPIN ) 0.5 MG tablet Take 1 tablet (0.5 mg total) by mouth 5 (five)  times daily 150 tablet 1  . colchicine (COLCRYS) 0.6 mg tablet Take 1 tablet (0.6 mg total) by mouth 2 (two) times daily as needed (gout/toe pain) 40 tablet 0  . cyanocobalamin (VITAMIN B12) 1000 MCG tablet Take 1,000 mcg by mouth once daily       . docusate sodium  (STOOL SOFTENER ORAL) Take 1 tablet by mouth 2 (two) times daily as needed       . donepeziL  (ARICEPT ) 10 MG tablet TAKE 1 TABLET AT BEDTIME 90 tablet 3  . FUROsemide (LASIX) 20 MG tablet Take 1 tablet (20 mg total) by mouth once daily as needed for Edema 90 tablet 2  . gabapentin  (NEURONTIN ) 600 MG tablet Take gabapentin  600 mg  and 1200 mg at night 270 tablet 0  . ketoconazole (NIZORAL) 2 % shampoo APPLY TOPICALLY  ONCE AND LEAVE ON FOR 5 MINUTES THEN RINSE  USE TWICE WEEKLY X 4 WEEKS THEN AS NEEDED ONLY 120 mL 1  . lidocaine  (LIDODERM ) 5 % patch APPLY ONE PATCH TOPICALLY TO CLEAN, DRY SKIN. LEAVE ON FOR 12 HOURS THEN REMOVE. 30 patch 0  . meloxicam (MOBIC) 15 MG tablet Take 1 tablet (15 mg total) by mouth once daily 30 tablet 11  . QUEtiapine  (SEROQUEL ) 50 MG tablet Take 1 tab at night for three nights then increase to 2 tabs at night for three nights then increase to 3 tabs at night then continue that dose. (Patient taking differently: Take 100 mg by mouth at bedtime) 90 tablet 1  . rOPINIRole  (REQUIP ) 2 MG immediate release tablet TAKE 1 TABLET EVERY EVENING 90 tablet 3  . traMADoL  (ULTRAM ) 50 mg tablet Take 1 tablet (50 mg total) by mouth at bedtime 30 tablet 0  . traMADoL  (ULTRAM ) 50 mg tablet Take 1 tablet (50 mg total) by mouth  every 8 (eight) hours as needed for Pain 60 tablet 0  . venlafaxine  (EFFEXOR -XR) 75 MG XR capsule TAKE 1 CAPSULE EVERY MORNING (Patient taking differently: Take 75 mg by mouth at bedtime) 30 capsule 2  . baclofen (LIORESAL) 5 mg tablet Take 5 mg by mouth 2 (two) times daily (Patient not taking: Reported on 02/11/2024)    . carbidopa -levodopa  (SINEMET ) 25-250 mg tablet Take 1.5 tablets by mouth 6 (six) times  daily (Patient not taking: Reported on 08/07/2022) 270 tablet 1  . carbidopa -levodopa  (SINEMET ) 25-250 mg tablet 2 pills at 6 am,1 pill at 8 am, 2 pills at 10 am, 1 pill at 12 pm, 2 pills at 2 pm, 1 pill at 4 pm, 2 pills at 6 pm, and 1 pill at 8 pm (12 pills today per day). (Patient not taking: Reported on 02/11/2024) 1080 tablet 0  . carbidopa -levodopa  (SINEMET ) 25-250 mg tablet 2 pills at 6 am,1 pill at 8 am, 2 pills at 10 am, 1 pill at 12 pm, 2 pills at 2 pm, 1 pill at 4 pm, 2 pills at 6 pm, and 1 pill at 8 pm (Patient not taking: Reported on 02/11/2024) 1080 tablet 1  . carbidopa -levodopa  (SINEMET ) 25-250 mg tablet Take 2 pills at 6 am,1 pill at 8 am, 2 pills at 10 am, 1 pill at 12 pm, 2 pills at 2 pm, 1 pill at 4 pm, 2 pills at 6 pm, and 1 pill at 8 pm (Patient not taking: Reported on 02/11/2024) 1080 tablet 1  . carbidopa -levodopa  (SINEMET ) 25-250 mg tablet 2 pills every three hours. 2 pills at 6 AM, 2 pills at 9 AM, 2 pills at 12 PM, 2 pills at 3 PM, 2 pills at 6 PM, 2 pills at 9 PM (Patient not taking: Reported on 02/11/2024) 270 tablet 1  . carbidopa -levodopa  (SINEMET ) 25-250 mg tablet TAKE 2 TABLETS EVERY 3 HOURS, AT 6AM, 9AM, 12PM, 3PM, 6PM, AND 9PM AS DIRECTED (Patient not taking: Reported on 02/11/2024) 540 tablet 3  . carbidopa -levodopa  (SINEMET ) 25-250 mg tablet TAKE 2 TABLETS EVERY 3 HOURS, AT 6AM, 9AM, 12PM, 3PM, 6PM, AND 9PM AS DIRECTED (Patient not taking: Reported on 02/11/2024) 1080 tablet 1  . donepeziL  (ARICEPT ) 5 MG tablet TAKE 1 TABLET EVERY NIGHT (Patient not taking: Reported on 08/21/2023) 30 tablet 0  . escitalopram oxalate (LEXAPRO) 10 MG tablet Take 1 tablet (10 mg total) by mouth 2 (two) times daily for 90 days (Patient not taking: Reported on 10/15/2023) 60 tablet 1  . escitalopram oxalate (LEXAPRO) 10 MG tablet Take 1 tablet (10 mg total) by mouth 2 (two) times daily (Patient not taking: Reported on 08/04/2023) 60 tablet 0  . gabapentin  (NEURONTIN ) 600 MG tablet Take 1200 mg at  night (Patient not taking: Reported on 08/04/2023) 180 tablet 1  . gabapentin  (NEURONTIN ) 600 MG tablet Take 1 tablet (600 mg total) by mouth every morning Take 600 mg twice a day (Patient not taking: Reported on 08/04/2023) 90 tablet 1  . gabapentin  (NEURONTIN ) 600 MG tablet Take 1 tablet (600 mg total) by mouth every morning (Patient not taking: Reported on 08/04/2023) 90 tablet 1  . gabapentin  (NEURONTIN ) 800 MG tablet Take 2 tablets (1,600 mg total) by mouth at bedtime (Patient not taking: Reported on 02/11/2024) 180 tablet 1  . polyethylene glycol (MIRALAX) powder Take 17 g by mouth once daily as needed for Constipation Mix in 4-8ounces of fluid prior to taking. (Patient not taking: Reported on 02/25/2023)    . QUEtiapine  (  SEROQUEL ) 100 MG tablet Take 1.5 tablets (150 mg total) by mouth at bedtime (Patient not taking: Reported on 02/11/2024) 45 tablet 2  . QUEtiapine  (SEROQUEL ) 50 MG tablet Take 100 mg at night (Patient not taking: Reported on 02/11/2024) 180 tablet 1  . traMADoL  (ULTRAM ) 50 mg tablet TAKE 1 TABLET BY MOUTH ONCE DAILY AS NEEDED FOR PAIN (Patient not taking: Reported on 08/04/2023) 30 tablet 0   No current facility-administered medications for this visit.    ALLERGIES Allergies  Allergen Reactions  . Ibuprofen Hives, Anaphylaxis and Rash    Other reaction(s): HIVES Other reaction(s): HIVES  . Penicillins Anaphylaxis and Rash    Has patient had a PCN reaction causing immediate rash, facial/tongue/throat swelling, SOB or lightheadedness with hypotension: Yes Has patient had a PCN reaction causing severe rash involving mucus membranes or skin necrosis: Yes Has patient had a PCN reaction that required hospitalization Yes Has patient had a PCN reaction occurring within the last 10 years: Yes If all of the above answers are NO, then may proceed with Cephalosporin use. Has patient had a PCN reaction causing immediate rash, facial/tongue/throat swelling, SOB or lightheadedness with  hypotension: Yes Has patient had a PCN reaction causing severe rash involving mucus membranes or skin necrosis: Yes Has patient had a PCN reaction that required hospitalization Yes Has patient had a PCN reaction occurring within the last 10 years: Yes If all of the above answers are NO, then may proceed with Cephalosporin use.    Has patient had a PCN reaction causing immediate rash, facial/tongue/throat swelling, SOB or lightheadedness with hypotension: Yes Has patient had a PCN reaction causing severe rash involving mucus membranes or skin necrosis: Yes Has patient had a PCN reaction that required hospitalization Yes Has patient had a PCN reaction occurring within the last 10 years: Yes If all of the above answers are NO, then may proceed with Cephalosporin use. Other reaction(s): HIVES   . Prednisone Palpitations  . Citalopram Unknown    dizziness     . Citalopram Analogues Other (See Comments)    Dizziness  . Cymbalta [Duloxetine] Anxiety  . Divalproex Other (See Comments) and Dizziness    drowsiness  . Percocet [Oxycodone -Acetaminophen ] Rash  . Valproic Acid Other (See Comments)    drowsiness  . Aspirin  Palpitations     EXAM   Vitals:   02/11/24 1022  BP: 134/72  Weight: (!) 114.3 kg (252 lb)  Height: 172.7 cm (5' 8)  PainSc:   7  PainLoc: Back     Body mass index is 38.32 kg/m.  Memory Evaluation: 05/07/2023 - 28/29 11/26/2022 - 27/30 06/27/2021 - 28/30   GENERAL: Very Pleasant male, in no acute distress. Normocephalic and atraumatic. Mild hypophonia.   MUSCULOSKELETAL: Bulk - Normal Tone - Normal Pronator Drift - Absent bilaterally. Ambulation - Gait and station is slow and mildly unstead gait today, shuffling noted, mild limp noticed due to left knee laceration Romberg - deferred  R/L 5/5    Shoulder abduction (deltoid/supraspinatus, axillary/suprascapular n, C5) 5/5    Elbow flexion (biceps brachii, musculoskeletal n, C5-6) 5/5    Elbow  extension (triceps, radial n, C7) 5/5    Finger adduction (interossei, ulnar n, T1)  5/5    Hip flexion (iliopsoas, L1/L2) 5/5    Knee flexion (hamstrings, sciatic n, L5/S1)  5/5    Knee extension (quadriceps, femoral n, L3/4) 5/5    Ankle dorsiflexion (tibialis anterior, deep fibular n, L4/5) 5/5    Ankle plantarflexion (gastroc, tibial  n, S1)   NEUROLOGICAL: MENTAL STATUS: Patient is oriented to person, place and time.   Short-term memory is Intact Long-term memory is intact.   Attention span and concentration are intact.   Naming and repetition are intact. Comprehension is intact.   Expressive speech is intact.   Patient's fund of knowledge is within normal limits for educational level.  CRANIAL NERVES: Visual acuity and visual fields are intact         Extraocular muscles are intact                        Facial sensation is intact bilaterally                Facial strength is intact bilaterally                   Hearing is intact bilaterally                              Palate elevates midline, normal phonation     Shoulder shrug strength is intact                    Tongue protrudes midline                       SENSATION: Pain and temperature (spinothalamic tracts) is normal. Position and vibration (dorsal columns) is normal.  REFLEXES: R/L 2+/2+    Biceps 2+/2+    Brachioradialis  2+/2+    Patellar 2+/2+    Achilles  COORDINATION/CEREBELLAR: Finger to nose testing is Not tested today      PAST MEDICAL HISTORY Past Medical History:  Diagnosis Date  . Allergic rhinitis   . Alternating intermittent exotropia   . Benign prostatic hypertrophy   . Chronic low back pain   . Chronic pain   . Fatty liver 07/10/2016  . Generalized anxiety disorder   . Gout, joint   . Impaired glucose tolerance 09/30/2019   HgbA1c 6.2% 09/30/2019  . Insomnia   . Lumbar degenerative disc disease   . Nephrolithiasis   . Obesity   . On home O2    ' as needed'  . Panic attacks    . Parkinson disease (CMS/HHS-HCC)   . Restless leg syndrome   . Sleep apnea 05/07/2018  . Vision abnormalities     PAST SURGICAL HISTORY Past Surgical History:  Procedure Laterality Date  . COLONOSCOPY  10/13/2018   Hyperplastic Polyp; Tubular Adenoma; Fragments of Sessile Serrated Polyp/Repeat 35yrs/MUS  . COLONOSCOPY  04/25/2021   Normal colon/PHx CP/Repeat 9yrs/JWB  . APPENDECTOMY    . back surgery      burned nerves in back   . COLONOSCOPY     Int/Ext hemorrhoids; Multiple Adenomas Polyps  . TONSILLECTOMY      FAMILY HISTORY Family History  Problem Relation Name Age of Onset  . Diabetes type II Mother    . Diabetes type II Father      SOCIAL HISTORY  Social History   Tobacco Use  . Smoking status: Former    Current packs/day: 0.00    Average packs/day: 2.5 packs/day for 15.0 years (37.5 ttl pk-yrs)    Types: Cigarettes    Start date: 08/19/1977    Quit date: 08/19/1992    Years since quitting: 31.5  . Smokeless tobacco: Never  Vaping Use  . Vaping status: Never Used  Substance Use Topics  . Alcohol use: No    Alcohol/week: 0.0 standard drinks of alcohol  . Drug use: No     REVIEW OF SYSTEMS:  13 system ROS form was given to the patient to complete and I have reviewed it.  The form was sent for scan to the patient's EHR.  Pertinent positives and negatives are mentioned above in the HPI and all other systems are negative.   DATA  I have personally reviewed all of the data outlined below both prior to the appointment and during the appointment with the patient as appropriate.  __________________________________________________  IMAGING  11/28/2016 MRI LUMBAR SPINE   Indication: SPINAL STENOSIS, LUMBAR, , M54.5 Low back pain, G89.29 Other chronic pain, M51.37 Other intervertebral disc degeneration, lumbosacral region, M48.061 Spinal stenosis, lumbar region without neurogenic claudication.   Comparison: None.   Technique: Multiplanar, multiecho MR  imaging of the lumbar spine was performed, including T1-weighted and fluid sensitive sequences.     FINDINGS: Conus: The conus is normal in appearance and position.   Alignment: The alignment of the lumbar spine is normal on these supine, neutral images.    Marrow: The visualized bone marrow is normal. Chronic ventral wedging of L2 and L3. Mild endplate changes.      T12-L1: Broad-based disc bulge and mild facet DJD with minimal impress on the sac, no canal stenosis. No foraminal narrowing.   L1-L2: Broad-based disc bulge slightly eccentric to the left, facet DJD and minimal impress on the sac, no canal stenosis. No foraminal narrowing.   L2-L3: Broad-based disc bulge, facet arthrosis and ligamentum flavum thickening with mild impress on the thecal sac. No foraminal narrowing.   L3-L4: No disc protrusion, canal stenosis, or foraminal stenosis.   L4-L5: Broad-based disc bulge with small central annular fissure, facet arthrosis, ligamentum flavum thickening and mild fluid within the facet joints. Moderate sac compression mild narrowing of the bilateral lateral recesses. Mild left greater than right foraminal narrowing.   L5-S1: Broad-based disc bulge with right paracentral annular fissure and facet arthrosis resulting in mild sac compression and impress on the descending right S1 nerve. Mild bilateral foraminal narrowing.   Visualized SI joints: Intact   Visualized soft tissues: Circumaortic left renal vein.     IMPRESSION: Multilevel lumbar degenerative disc disease with narrowing of the lateral recesses at L4-L5 disc material which contacts the right descending S1 nerve as above.  __________________________________________________   Ancillary Orders on 08/25/2023  Component Date Value Ref Range Status  . Thyroid Stimulating Hormone (TSH) 08/25/2023 1.046  0.450-5.330 uIU/ml uIU/mL Final  . Color 08/25/2023 Yellow  Colorless, Straw, Light Yellow, Yellow, Dark Yellow  Final  . Clarity 08/25/2023 Clear  Clear Final  . Specific Gravity 08/25/2023 1.027  1.005 - 1.030 Final  . pH, Urine 08/25/2023 6.0  5.0 - 8.0 Final  . Protein, Urinalysis 08/25/2023 Trace (!)  Negative mg/dL Final  . Glucose, Urinalysis 08/25/2023 Negative  Negative mg/dL Final  . Ketones, Urinalysis 08/25/2023 1+ (!)  Negative mg/dL Final  . Blood, Urinalysis 08/25/2023 Trace (!)  Negative Final  . Nitrite, Urinalysis 08/25/2023 Negative  Negative Final  . Leukocyte Esterase, Urinalysis 08/25/2023 Negative  Negative Final  . Bilirubin, Urinalysis 08/25/2023 Negative  Negative Final  . Urobilinogen, Urinalysis 08/25/2023 2.0 (H)  0.2 - 1.0 mg/dL Final  . WBC, UA 98/93/7974 1  <=5 /hpf Final  . Red Blood Cells, Urinalysis 08/25/2023 3  <=3 /hpf Final  . Bacteria, Urinalysis 08/25/2023 0-5  0 -  5 /hpf Final  . Squamous Epithelial Cells, Urinaly* 08/25/2023 0  /hpf Final   No follow-ups on file.  Payor: [employer]ICARE ADVANTAGE PLANS / Plan: [employer] PPO CHOICE / Product Type: PPO /    This note is partially written by Greig Pouch, scribe, in the presence of and acting as the scribe of Dr. Arthea Farrow.   I have reviewed, edited and added to the note as needed to reflect my best personal medical judgment.    Dr. Arthea Farrow, MD [facility] [place], [place] Ph:  [phone] Fax:  [phone]

## 2024-03-19 ENCOUNTER — Emergency Department

## 2024-03-19 ENCOUNTER — Observation Stay
Admission: EM | Admit: 2024-03-19 | Discharge: 2024-03-20 | Disposition: A | Discharge status: Home / Self Care | Attending: Obstetrics and Gynecology | Admitting: Obstetrics and Gynecology

## 2024-03-19 ENCOUNTER — Other Ambulatory Visit: Payer: Self-pay

## 2024-03-19 DIAGNOSIS — Z87891 Personal history of nicotine dependence: Secondary | ICD-10-CM | POA: Insufficient documentation

## 2024-03-19 DIAGNOSIS — G3184 Mild cognitive impairment, so stated: Secondary | ICD-10-CM | POA: Diagnosis present

## 2024-03-19 DIAGNOSIS — I2699 Other pulmonary embolism without acute cor pulmonale: Principal | ICD-10-CM | POA: Diagnosis present

## 2024-03-19 DIAGNOSIS — I2693 Single subsegmental pulmonary embolism without acute cor pulmonale: Secondary | ICD-10-CM

## 2024-03-19 DIAGNOSIS — R079 Chest pain, unspecified: Secondary | ICD-10-CM

## 2024-03-19 DIAGNOSIS — R7302 Impaired glucose tolerance (oral): Secondary | ICD-10-CM | POA: Diagnosis present

## 2024-03-19 DIAGNOSIS — M51369 Other intervertebral disc degeneration, lumbar region without mention of lumbar back pain or lower extremity pain: Secondary | ICD-10-CM | POA: Diagnosis present

## 2024-03-19 DIAGNOSIS — R0602 Shortness of breath: Secondary | ICD-10-CM | POA: Diagnosis present

## 2024-03-19 DIAGNOSIS — F410 Panic disorder [episodic paroxysmal anxiety] without agoraphobia: Secondary | ICD-10-CM | POA: Diagnosis present

## 2024-03-19 DIAGNOSIS — F411 Generalized anxiety disorder: Secondary | ICD-10-CM | POA: Diagnosis not present

## 2024-03-19 DIAGNOSIS — G2581 Restless legs syndrome: Secondary | ICD-10-CM | POA: Diagnosis not present

## 2024-03-19 DIAGNOSIS — G4733 Obstructive sleep apnea (adult) (pediatric): Secondary | ICD-10-CM | POA: Diagnosis present

## 2024-03-19 DIAGNOSIS — R911 Solitary pulmonary nodule: Secondary | ICD-10-CM

## 2024-03-19 DIAGNOSIS — E782 Mixed hyperlipidemia: Secondary | ICD-10-CM | POA: Insufficient documentation

## 2024-03-19 DIAGNOSIS — G20A1 Parkinson's disease without dyskinesia, without mention of fluctuations: Secondary | ICD-10-CM | POA: Diagnosis not present

## 2024-03-19 DIAGNOSIS — R0600 Dyspnea, unspecified: Secondary | ICD-10-CM

## 2024-03-19 DIAGNOSIS — G20C Parkinsonism, unspecified: Secondary | ICD-10-CM | POA: Insufficient documentation

## 2024-03-19 DIAGNOSIS — E785 Hyperlipidemia, unspecified: Secondary | ICD-10-CM | POA: Insufficient documentation

## 2024-03-19 LAB — URINALYSIS, W/ REFLEX TO CULTURE (INFECTION SUSPECTED)
Bilirubin Urine: NEGATIVE
Glucose, UA: NEGATIVE mg/dL
Hgb urine dipstick: NEGATIVE
Ketones, ur: NEGATIVE mg/dL
Leukocytes,Ua: NEGATIVE
Nitrite: NEGATIVE
Protein, ur: NEGATIVE mg/dL
Specific Gravity, Urine: 1.006 (ref 1.005–1.030)
Squamous Epithelial / HPF: 0 /HPF (ref 0–5)
pH: 7 (ref 5.0–8.0)

## 2024-03-19 LAB — CBC WITH DIFFERENTIAL/PLATELET
Abs Immature Granulocytes: 0.01 K/uL (ref 0.00–0.07)
Basophils Absolute: 0 K/uL (ref 0.0–0.1)
Basophils Relative: 1 %
Eosinophils Absolute: 0.1 K/uL (ref 0.0–0.5)
Eosinophils Relative: 1 %
HCT: 39.8 % (ref 39.0–52.0)
Hemoglobin: 13.5 g/dL (ref 13.0–17.0)
Immature Granulocytes: 0 %
Lymphocytes Relative: 20 %
Lymphs Abs: 1 K/uL (ref 0.7–4.0)
MCH: 32.5 pg (ref 26.0–34.0)
MCHC: 33.9 g/dL (ref 30.0–36.0)
MCV: 95.9 fL (ref 80.0–100.0)
Monocytes Absolute: 0.6 K/uL (ref 0.1–1.0)
Monocytes Relative: 11 %
Neutro Abs: 3.6 K/uL (ref 1.7–7.7)
Neutrophils Relative %: 67 %
Platelets: 144 K/uL — ABNORMAL LOW (ref 150–400)
RBC: 4.15 MIL/uL — ABNORMAL LOW (ref 4.22–5.81)
RDW: 13.4 % (ref 11.5–15.5)
WBC: 5.3 K/uL (ref 4.0–10.5)
nRBC: 0 % (ref 0.0–0.2)

## 2024-03-19 LAB — COMPREHENSIVE METABOLIC PANEL WITH GFR
ALT: 7 U/L (ref 0–44)
AST: 30 U/L (ref 15–41)
Albumin: 3.8 g/dL (ref 3.5–5.0)
Alkaline Phosphatase: 99 U/L (ref 38–126)
Anion gap: 9 (ref 5–15)
BUN: 19 mg/dL (ref 8–23)
CO2: 28 mmol/L (ref 22–32)
Calcium: 9.2 mg/dL (ref 8.9–10.3)
Chloride: 103 mmol/L (ref 98–111)
Creatinine, Ser: 1.27 mg/dL — ABNORMAL HIGH (ref 0.61–1.24)
GFR, Estimated: >60 mL/min (ref >60)
Glucose, Bld: 95 mg/dL (ref 70–99)
Potassium: 4.3 mmol/L (ref 3.5–5.1)
Sodium: 140 mmol/L (ref 135–145)
Total Bilirubin: 1.1 mg/dL (ref 0.0–1.2)
Total Protein: 7.1 g/dL (ref 6.5–8.1)

## 2024-03-19 LAB — APTT: aPTT: 33 s (ref 24–36)

## 2024-03-19 LAB — TROPONIN I (HIGH SENSITIVITY)
Troponin I (High Sensitivity): 3 ng/L (ref <18)
Troponin I (High Sensitivity): 4 ng/L (ref <18)

## 2024-03-19 LAB — PROTIME-INR
INR: 1.1 (ref 0.8–1.2)
Prothrombin Time: 15 s (ref 11.4–15.2)

## 2024-03-19 LAB — BRAIN NATRIURETIC PEPTIDE: B Natriuretic Peptide: 32.4 pg/mL (ref 0.0–100.0)

## 2024-03-19 LAB — HEPARIN LEVEL (UNFRACTIONATED): Heparin Unfractionated: 0.28 [IU]/mL — ABNORMAL LOW (ref 0.30–0.70)

## 2024-03-19 MED ORDER — ONDANSETRON HCL 4 MG PO TABS: 4.0000 mg | ORAL_TABLET | Freq: Four times a day (QID) | ORAL | Status: DC | PRN

## 2024-03-19 MED ORDER — DONEPEZIL HCL 5 MG PO TABS
5.0000 mg | ORAL_TABLET | Freq: Every day | ORAL | Status: DC
  Administered 2024-03-20: 5 mg via ORAL
  Filled 2024-03-19: qty 1

## 2024-03-19 MED ORDER — ACETAMINOPHEN 325 MG PO TABS: 650.0000 mg | ORAL_TABLET | Freq: Four times a day (QID) | ORAL | Status: DC | PRN

## 2024-03-19 MED ORDER — IOHEXOL 350 MG/ML SOLN
75.0000 mL | Freq: Once | INTRAVENOUS | Status: AC | PRN
  Administered 2024-03-19: 75 mL via INTRAVENOUS

## 2024-03-19 MED ORDER — ONDANSETRON HCL 4 MG/2ML IJ SOLN: 4.0000 mg | Freq: Four times a day (QID) | INTRAMUSCULAR | Status: DC | PRN

## 2024-03-19 MED ORDER — HEPARIN BOLUS VIA INFUSION
4000.0000 [IU] | Freq: Once | INTRAVENOUS | Status: AC
  Administered 2024-03-19: 4000 [IU] via INTRAVENOUS
  Filled 2024-03-19: qty 4000

## 2024-03-19 MED ORDER — HEPARIN (PORCINE) 25000 UT/250ML-% IV SOLN
1400.0000 [IU]/h | INTRAVENOUS | Status: DC
  Administered 2024-03-19: 1200 [IU]/h via INTRAVENOUS
  Administered 2024-03-20: 1400 [IU]/h via INTRAVENOUS
  Filled 2024-03-19 (×2): qty 250

## 2024-03-19 MED ORDER — ROPINIROLE HCL 1 MG PO TABS
4.0000 mg | ORAL_TABLET | Freq: Every day | ORAL | Status: DC
Start: 2024-03-20 — End: 2024-03-20
  Administered 2024-03-20: 4 mg via ORAL
  Filled 2024-03-19: qty 4

## 2024-03-19 MED ORDER — HEPARIN BOLUS VIA INFUSION
1400.0000 [IU] | Freq: Once | INTRAVENOUS | Status: AC
  Administered 2024-03-19: 1400 [IU] via INTRAVENOUS
  Filled 2024-03-19: qty 1400

## 2024-03-19 MED ORDER — CLONAZEPAM 0.5 MG PO TABS
0.5000 mg | ORAL_TABLET | Freq: Three times a day (TID) | ORAL | Status: DC | PRN
  Administered 2024-03-20 (×2): 0.5 mg via ORAL
  Filled 2024-03-19 (×2): qty 1

## 2024-03-19 MED ORDER — QUETIAPINE FUMARATE 25 MG PO TABS
100.0000 mg | ORAL_TABLET | Freq: Every day | ORAL | Status: DC
  Filled 2024-03-19: qty 4

## 2024-03-19 MED ORDER — ACETAMINOPHEN 650 MG RE SUPP: 650.0000 mg | Freq: Four times a day (QID) | RECTAL | Status: DC | PRN

## 2024-03-19 MED ORDER — CARBIDOPA-LEVODOPA 25-250 MG PO TABS
2.0000 | ORAL_TABLET | Freq: Three times a day (TID) | ORAL | Status: DC
  Administered 2024-03-19 – 2024-03-20 (×2): 2 via ORAL
  Filled 2024-03-19 (×3): qty 2

## 2024-03-19 NOTE — ED Provider Notes (Signed)
 [name] Provider Note    Event Date/Time   First MD Initiated Contact with Patient 03/19/24 1141     (approximate)   History   Chest Pain   HPI  Gabriel Dillon is a 68 y.o. male with history of Parkinson's disease, BPH, GAD, presenting with chest pain and shortness of breath as well as generalized weakness.  He denies any urinary symptoms, states that he has intermittent cough but none now.  States that after EMS gave him a little bit of oxygen he feels like the chest pain and shortness of breath is improved.  He said he does have history of COPD and uses oxygen intermittently at night.  He denies any recent trauma or falls, not on any anticoagulation.  No history of blood clots.  No history of cancer.  He denies any nausea vomiting or diarrhea.  Per independent history from EMS, vitals were stable, satting high 90s on room air.  Was given some oxygen for comfort.    On independent chart review, he was seen by Dr. Lane from neurology at the end of June, has history of Parkinson disease, memory loss, difficulty walking, falls.  Also with restless leg.  When he was seen, started worsening hypophonia.  Physical Exam   Triage Vital Signs: ED Triage Vitals  Encounter Vitals Group     BP      Girls Systolic BP Percentile      Girls Diastolic BP Percentile      Boys Systolic BP Percentile      Boys Diastolic BP Percentile      Pulse      Resp      Temp      Temp src      SpO2      Weight      Height      Head Circumference      Peak Flow      Pain Score      Pain Loc      Pain Education      Exclude from Growth Chart     Most recent vital signs: Vitals:   03/19/24 1200 03/19/24 1204  BP: (!) 164/91   Pulse: 67   Resp: (!) 21   Temp:  98.1 F (36.7 C)  SpO2: 98%      General: Awake, no distress.  CV:  Good peripheral perfusion.  Resp:  Normal effort.  Clear Abd:  No distention.  Soft nontender Other:  Bilateral lower extremity  edema, right is larger than left   ED Results / Procedures / Treatments   Labs (all labs ordered are listed, but only abnormal results are displayed) Labs Reviewed  COMPREHENSIVE METABOLIC PANEL WITH GFR - Abnormal; Notable for the following components:      Result Value   Creatinine, Ser 1.27 (*)    All other components within normal limits  CBC WITH DIFFERENTIAL/PLATELET - Abnormal; Notable for the following components:   RBC 4.15 (*)    Platelets 144 (*)    All other components within normal limits  URINALYSIS, W/ REFLEX TO CULTURE (INFECTION SUSPECTED) - Abnormal; Notable for the following components:   Color, Urine STRAW (*)    APPearance CLEAR (*)    Bacteria, UA RARE (*)    All other components within normal limits  BRAIN NATRIURETIC PEPTIDE  TROPONIN I (HIGH SENSITIVITY)  TROPONIN I (HIGH SENSITIVITY)     EKG  EKG shows, sinus tach, rate 69, normal  QS, normal QTc, no obvious ischemic ST elevation, not significantly compared to prior   RADIOLOGY On my independent interpretation, chest x-ray without obvious consolidation   PROCEDURES:  Critical Care performed: Yes, see critical care procedure note(s)  .Critical Care  Performed by: Waymond Lorelle Cummins, MD Authorized by: Waymond Lorelle Cummins, MD   Critical care provider statement:    Critical care time (minutes):  40   Critical care was necessary to treat or prevent imminent or life-threatening deterioration of the following conditions:  Circulatory failure   Critical care was time spent personally by me on the following activities:  Development of treatment plan with patient or surrogate, discussions with consultants, evaluation of patient's response to treatment, examination of patient, ordering and review of laboratory studies, ordering and review of radiographic studies, ordering and performing treatments and interventions, pulse oximetry, re-evaluation of patient's condition and review of old charts    MEDICATIONS ORDERED  IN ED: Medications  iohexol  (OMNIPAQUE ) 350 MG/ML injection 75 mL (75 mLs Intravenous Contrast Given 03/19/24 1404)     IMPRESSION / MDM / ASSESSMENT AND PLAN / ED COURSE  I reviewed the triage vital signs and the nursing notes.                              Differential diagnosis includes, but is not limited to, ACS, angina, CHF, PE, DVT, UA, electrolyte derangements.  Will get labs, EKG, troponin, chest x-ray, CT PE study, DVT ultrasound.  Patient's presentation is most consistent with acute presentation with potential threat to life or bodily function.  Independent interpretation of labs and imaging below.  Discussed with patient about imaging and lab results including incidental findings, he still notes some shortness of breath, his lungs are clear at this time, on room air.  Will put in for heparin for him.  Consulted hospitalist was agreeable with the plan for admission and will evaluate the patient.  He is admitted.  The patient is on the cardiac monitor to evaluate for evidence of arrhythmia and/or significant heart rate changes.   Clinical Course as of 03/19/24 1517  Fri Mar 19, 2024  1249 DG Chest 1 View No active disease.  [TT]  1340 Independent review of labs, no leukocytosis, UA is not consistent with UTI , electrolytes not severely deranged.  He has a mild AKI.  Troponin and BNP are not elevated. [TT]  1423 US  Venous Img Lower Right (DVT Study) 1. No evidence of DVT.  [TT]  1451 See if called radiology, patient has small PE. [TT]  1514 CT Angio Chest PE W/Cm &/Or Wo Cm IMPRESSION: Small filling defect along branch point in the medial right lung base along the pulmonary artery consistent with a subtle peripheral pulmonary embolus. No larger or more central emboli clearly seen at this time.  Underinflation with scattered scarring atelectatic changes. There is a small ground-glass nodule in the lingula measuring 4 mm If patient is low risk for malignancy, no routine  follow-up imaging is recommended. If patient is high risk for malignancy, a non-contrast chest CT at 12 months is optional.This recommendation follows the consensus statement: Guidelines for Management of Incidental Pulmonary Nodules Detected on CT Images: From the Fleischner Society 2017; Radiology 2017; [phone].  Nodular liver with splenomegaly and varices.  Aortic Atherosclerosis (ICD10-I70.0).   [TT]    Clinical Course User Index [TT] Waymond Lorelle Cummins, MD     FINAL CLINICAL IMPRESSION(S) / ED DIAGNOSES  Final diagnoses:  Chest pain, unspecified type  Dyspnea, unspecified type  Other acute pulmonary embolism, unspecified whether acute cor pulmonale present [facility])  Pulmonary nodule     Rx / DC Orders   ED Discharge Orders     None        Note:  This document was prepared using Dragon voice recognition software and may include unintentional dictation errors.    Waymond Lorelle Cummins, MD 03/19/24 [phone]

## 2024-03-19 NOTE — Plan of Care (Signed)

## 2024-03-19 NOTE — Consult Note (Signed)
 PHARMACY - ANTICOAGULATION CONSULT NOTE  Pharmacy Consult for Heparin infusion Indication: chest pain/ACS  Allergies  Allergen Reactions   Bee Venom Anaphylaxis   Motrin [Ibuprofen] Anaphylaxis   Penicillins Anaphylaxis   Prednisone Palpitations   Celexa [Citalopram] Other (See Comments)    Dizziness    Depakote [Valproic Acid] Other (See Comments)    Drowsiness    Aspirin  Palpitations   Cymbalta [Duloxetine Hcl] Swelling and Anxiety   Percocet [Oxycodone -Acetaminophen ] Rash    Pt will take PRN, rarely.    Patient Measurements: Height: 5' 8 (172.7 cm) Weight: 112.5 kg (248 lb 1.6 oz) IBW/kg (Calculated) : 68.4 HEPARIN DW (KG): 93.6  Vital Signs: Temp: 98 F (36.7 C) (08/01 1954) Temp Source: Oral (08/01 1954) BP: 155/90 (08/01 1954) Pulse Rate: 64 (08/01 1954)  Labs: Recent Labs    03/19/24 1143 03/19/24 1343 03/19/24 1520 03/19/24 2222  HGB 13.5  --   --   --   HCT 39.8  --   --   --   PLT 144*  --   --   --   APTT  --   --  33  --   LABPROT  --   --  15.0  --   INR  --   --  1.1  --   HEPARINUNFRC  --   --   --  0.28*  CREATININE 1.27*  --   --   --   TROPONINIHS 3 4  --   --    Estimated Creatinine Clearance: 69.6 mL/min (A) (by C-G formula based on SCr of 1.27 mg/dL (H)).  Medical History: Past Medical History:  Diagnosis Date   Allergic rhinitis    Back pain    BPH (benign prostatic hyperplasia)    Enlarged prostate    Fatty liver    Gout    Lumbar degenerative disc disease    Nephrolithiasis    Neuromuscular disorder (HCC)    Palpitation    Panic attacks    Panic attacks    Parkinson's disease (HCC)    Sleep apnea    Urge incontinence     Medications:  NO AC per medrec and fill history  Assessment: Patient with PMH relevant for Parkinson's disease, BPH, and GAD presents to ED with chest pain. Pharmacy consulted to initiate and manage heparin infusion for CAD.  Goal of Therapy:  Heparin level 0.3-0.7 units/ml Monitor platelets by  anticoagulation protocol: Yes   Plan:  8/1:  HL @ 2222 = 0.28, SUBtherapaeutic - will order heparin 1400 units IV X 1 bolus and increase drip rate to 1400 units/hr - recheck HL 6 hrs after rate change  Continue to monitor H&H and platelets  Jsiah Menta D 03/19/2024 11:03 PM

## 2024-03-19 NOTE — ED Triage Notes (Signed)
 Pt arrives via EMS from home d/t chest pain and difficulty breathing  Denies any coughing  EMS assessment: Walks w/a walker  EMS vitals: 97% on RA, BP: 170/88 HR: 77 CBG: 125, No temp  EMS interventions: None  MedHx: Parkinsons, COPD  Meds: Gabapentin , O2 for COPD

## 2024-03-19 NOTE — H&P (Signed)
 History and Physical    Patient: Gabriel Dillon FMW:[id number] DOB: 10-07-1955 DOA: 03/19/2024 DOS: the patient was seen and examined on 03/19/2024 PCP: Pcp, No  Patient coming from: Home  Chief Complaint:  Chief Complaint  Patient presents with   Chest Pain   HPI: Gabriel Dillon is a 68 y.o. male with medical history significant of allergic rhinitis, anxiety, B12 deficiency, BPH, COPD, constipation, fatty liver, mixed hyperlipidemia, impaired glucose tolerance, loss of memory, lumbar DDD, lumbar stenosis without neurogenic claudication, lymphedema both lower extremity, obstructive sleep apnea, Parkinson's disease, pelvic and perineal pain, prostatitis, restless leg syndrome, history of syncope, urinary urgency who presented to the emergency department with complaints of sudden onset of chest pain associated with dyspnea and generalized weakness. No chest pain, palpitations, diaphoresis, PND, orthopnea, but has RLE edema. He denied fever, chills, rhinorrhea, sore throat, wheezing or hemoptysis. No abdominal pain, nausea, emesis, diarrhea, constipation, melena or hematochezia. No flank pain, dysuria, frequency or hematuria. No polyuria, polydipsia, polyphagia or blurred vision.   Lab work: Urinalysis was clear in appearance with rare bacteria but otherwise negative.  CBC showed a white count of 5.3, hemoglobin 13.5 g/dL and platelets 855.  BNP and troponin x 2 normal.  CMP showed a creatinine of 1.27 mg/dL but was otherwise unremarkable.  Imaging: Portable 1 view chest radiograph with no active disease.  Right lower extremity with no evidence of DVT.  CTA chest shows small filling defect along the branch point in the medial right lung base along the pulmonary artery consistent with a subtle peripheral pulmonary embolus.  No large or more central emboli clearly seen at this time.  There is a 4 mm small groundglass nodule in the lingula.  If high risk for malignancy a noncontrast chest CT at 12 months  is optional.   ED course: Initial vital signs were temperature 98.1 F, pulse 68, respiration 21, BP 164/91 mmHg O2 sat 98% on room air.  The patient was started on heparin in the emergency department.  Review of Systems: As mentioned in the history of present illness. All other systems reviewed and are negative.  Past Medical History:  Diagnosis Date   Allergic rhinitis    Back pain    BPH (benign prostatic hyperplasia)    Enlarged prostate    Fatty liver    Gout    Lumbar degenerative disc disease    Nephrolithiasis    Neuromuscular disorder (HCC)    Palpitation    Panic attacks    Panic attacks    Parkinson's disease (HCC)    Sleep apnea    Urge incontinence    Past Surgical History:  Procedure Laterality Date   APPENDECTOMY     CATARACT EXTRACTION W/PHACO Left 10/03/2021   Procedure: CATARACT EXTRACTION PHACO AND INTRAOCULAR LENS PLACEMENT (IOC) LEFT;  Surgeon: Mittie Gaskin, MD;  Location: [facility];  Service: Ophthalmology;  Laterality: Left;  5.89 00:28.9   CATARACT EXTRACTION W/PHACO Right 10/17/2021   Procedure: CATARACT EXTRACTION PHACO AND INTRAOCULAR LENS PLACEMENT (IOC) RIGHT;  Surgeon: Enola Feliciano Hugger, MD;  Location: [facility];  Service: Ophthalmology;  Laterality: Right;  3.20 00:31.0   COLONOSCOPY WITH PROPOFOL  N/A 10/13/2018   Procedure: COLONOSCOPY WITH PROPOFOL ;  Surgeon: Gaylyn Gladis PENNER, MD;  Location: [facility];  Service: Endoscopy;  Laterality: N/A;   COLONOSCOPY WITH PROPOFOL  N/A 04/25/2021   Procedure: COLONOSCOPY WITH PROPOFOL ;  Surgeon: Dessa Reyes ORN, MD;  Location: [facility] ENDOSCOPY;  Service: Endoscopy;  Laterality: N/A;  TONSILLECTOMY     Social History:  reports that he quit smoking about 31 years ago. His smoking use included cigarettes. He started smoking about 41 years ago. He has a 20 pack-year smoking history. He has never used smokeless tobacco. He reports that he does not drink alcohol and does not use  drugs.  Allergies  Allergen Reactions   Bee Venom Anaphylaxis   Motrin [Ibuprofen] Anaphylaxis   Penicillins Anaphylaxis   Prednisone Palpitations   Celexa [Citalopram] Other (See Comments)    Dizziness    Depakote [Valproic Acid] Other (See Comments)    Drowsiness    Aspirin  Palpitations   Cymbalta [Duloxetine Hcl] Swelling and Anxiety   Percocet [Oxycodone -Acetaminophen ] Rash    Pt will take PRN, rarely.    Family History  Problem Relation Age of Onset   Mental illness Neg Hx     Prior to Admission medications   Medication Sig Start Date End Date Taking? Authorizing Provider  acetaminophen  (TYLENOL ) 500 MG tablet Take 1,000 mg by mouth every 6 (six) hours as needed for moderate pain, fever or headache.    [provider]  Ascorbic Acid (VITAMIN C PO) Take 1 tablet by mouth daily.    [provider]  Baclofen 5 MG TABS Take 5 mg by mouth 2 (two) times daily.    [provider]  carbidopa -levodopa  (SINEMET  IR) 25-250 MG tablet Take 2 tablets by mouth 3 (three) times daily.    [provider]  clonazePAM  (KLONOPIN ) 0.5 MG tablet Take 0.5 mg by mouth 5 (five) times daily.    [provider]  Cyanocobalamin (VITAMIN B-12 PO) Take 1 tablet by mouth daily.    [provider]  donepezil  (ARICEPT ) 5 MG tablet Take 5 mg by mouth daily. 09/15/19   [provider]  gabapentin  (NEURONTIN ) 300 MG capsule Take 2 capsules (600 mg total) by mouth at bedtime AND 2 capsules (600 mg total) 2 (two) times daily with breakfast and lunch. 04/05/23   Tobie Gaines, DO  meloxicam (MOBIC) 15 MG tablet Take 15 mg by mouth daily.    [provider]  QUEtiapine  (SEROQUEL ) 100 MG tablet Take 1 tablet (100 mg total) by mouth at bedtime. 04/05/23   Tobie Gaines, DO  rOPINIRole  (REQUIP ) 2 MG tablet Take 4 mg by mouth at bedtime. 10/25/19   [provider]  traMADol  (ULTRAM ) 50 MG tablet Take 50 mg by mouth 2 (two) times daily as needed for  moderate pain.    [provider]  venlafaxine  XR (EFFEXOR -XR) 75 MG 24 hr capsule Take 75 mg by mouth daily.    [provider]    Physical Exam: Vitals:   03/19/24 1142 03/19/24 1200 03/19/24 1204  BP:  (!) 164/91   Pulse:  67   Resp:  (!) 21   Temp:   98.1 F (36.7 C)  TempSrc:   Oral  SpO2:  98%   Weight: 112.5 kg    Height: 5' 8 (1.727 m)     Physical Exam Vitals and nursing note reviewed.  Constitutional:      General: He is awake. He is not in acute distress.    Appearance: He is ill-appearing.  HENT:     Head: Normocephalic.     Nose: No rhinorrhea.     Mouth/Throat:     Mouth: Mucous membranes are moist.  Eyes:     General: No scleral icterus.    Pupils: Pupils are equal, round, and reactive to light.  Neck:     Vascular: No JVD.  Cardiovascular:     Rate and Rhythm: Normal rate and regular rhythm.     Heart sounds: S1 normal and S2 normal.  Pulmonary:     Breath sounds: No wheezing, rhonchi or rales.  Abdominal:     General: Bowel sounds are normal. There is no distension.     Palpations: Abdomen is soft.     Tenderness: There is no abdominal tenderness. There is no right CVA tenderness or left CVA tenderness.  Musculoskeletal:     Cervical back: Neck supple.     Right lower leg: No edema.     Left lower leg: 1+ Edema present.  Skin:    General: Skin is warm and dry.  Neurological:     Mental Status: He is alert. He is disoriented.  Psychiatric:        Mood and Affect: Mood normal.        Behavior: Behavior normal. Behavior is cooperative.     Data Reviewed:  Results are pending, will review when available.  EKG: Vent. rate 69 BPM PR interval 163 ms QRS duration 95 ms QT/QTcB 392/420 ms P-R-T axes 64 21 40 Sinus rhythm Consider left atrial enlargement Abnormal R-wave progression, early transition  Assessment and Plan: Principal Problem:   Pulmonary embolism (HCC) Observation/telemetry. Supplemental oxygen as  needed. Continue heparin infusion. Will switch to DOAC in AM. No uptrend on troponin level. Obtain echocardiogram. Negative lower extremity Doppler.  Active Problems:   Parkinson's disease (HCC) Continue Sinemet  3 times daily.    Panic disorder   Generalized anxiety disorder Once med rec done: Continue venlafaxine  and clonazepam  as needed.    Lumbar degenerative disc disease Analgesics as needed.    Restless leg syndrome Continue ropinirole  for mg p.o. bedtime.    Hyperlipidemia, mixed Med rec pending. Follow-up with primary care provider.    Impaired glucose tolerance Follow fasting glucose in the morning.    OSA (obstructive sleep apnea) CPAP at bedtime.    MCI (mild cognitive impairment) Has been on donezepil. Will resume once med rec performed.    Advance Care Planning:   Code Status: Full Code   Consults:   Family Communication:   Severity of Illness: The appropriate patient status for this patient is OBSERVATION. Observation status is judged to be reasonable and necessary in order to provide the required intensity of service to ensure the patient's safety. The patient's presenting symptoms, physical exam findings, and initial radiographic and laboratory data in the context of their medical condition is felt to place them at decreased risk for further clinical deterioration. Furthermore, it is anticipated that the patient will be medically stable for discharge from the hospital within 2 midnights of admission.   Author: Alm Dorn Castor, MD 03/19/2024 3:16 PM  For on call review [email or web address].   This document was prepared using Dragon voice recognition software and may contain some unintended transcription errors.

## 2024-03-19 NOTE — Consult Note (Signed)
 PHARMACY - ANTICOAGULATION CONSULT NOTE  Pharmacy Consult for Heparin infusion Indication: chest pain/ACS  Allergies  Allergen Reactions   Bee Venom Anaphylaxis   Motrin [Ibuprofen] Anaphylaxis   Penicillins Anaphylaxis   Prednisone Palpitations   Celexa [Citalopram] Other (See Comments)    Dizziness    Depakote [Valproic Acid] Other (See Comments)    Drowsiness    Aspirin  Palpitations   Cymbalta [Duloxetine Hcl] Swelling and Anxiety   Percocet [Oxycodone -Acetaminophen ] Rash    Pt will take PRN, rarely.    Patient Measurements: Height: 5' 8 (172.7 cm) Weight: 112.5 kg (248 lb 1.6 oz) IBW/kg (Calculated) : 68.4 HEPARIN DW (KG): 93.6  Vital Signs: Temp: 98.1 F (36.7 C) (08/01 1204) Temp Source: Oral (08/01 1204) BP: 164/91 (08/01 1200) Pulse Rate: 67 (08/01 1200)  Labs: Recent Labs    03/19/24 1143 03/19/24 1343  HGB 13.5  --   HCT 39.8  --   PLT 144*  --   CREATININE 1.27*  --   TROPONINIHS 3 4   Estimated Creatinine Clearance: 69.6 mL/min (A) (by C-G formula based on SCr of 1.27 mg/dL (H)).  Medical History: Past Medical History:  Diagnosis Date   Allergic rhinitis    Back pain    BPH (benign prostatic hyperplasia)    Enlarged prostate    Fatty liver    Gout    Lumbar degenerative disc disease    Nephrolithiasis    Neuromuscular disorder (HCC)    Palpitation    Panic attacks    Panic attacks    Parkinson's disease (HCC)    Sleep apnea    Urge incontinence     Medications:  NO AC per medrec and fill history  Assessment: Patient with PMH relevant for Parkinson's disease, BPH, and GAD presents to ED with chest pain. Pharmacy consulted to initiate and manage heparin infusion for CAD.  Goal of Therapy:  Heparin level 0.3-0.7 units/ml Monitor platelets by anticoagulation protocol: Yes   Plan:  Give 4000 units bolus x 1 Start heparin infusion at 1200 units/hr Check  level in 6 hours and daily while on heparin Continue to monitor H&H and  platelets  Ardis Fullwood Rodriguez-Guzman PharmD, BCPS 03/19/2024 3:21 PM

## 2024-03-19 NOTE — ED Notes (Signed)
 Blue top sent with Pt label

## 2024-03-20 DIAGNOSIS — I2693 Single subsegmental pulmonary embolism without acute cor pulmonale: Secondary | ICD-10-CM | POA: Diagnosis not present

## 2024-03-20 LAB — HEPARIN LEVEL (UNFRACTIONATED)
Heparin Unfractionated: 0.44 [IU]/mL (ref 0.30–0.70)
Heparin Unfractionated: 0.45 [IU]/mL (ref 0.30–0.70)

## 2024-03-20 LAB — COMPREHENSIVE METABOLIC PANEL WITH GFR
ALT: 16 U/L (ref 0–44)
AST: 28 U/L (ref 15–41)
Albumin: 3.4 g/dL — ABNORMAL LOW (ref 3.5–5.0)
Alkaline Phosphatase: 89 U/L (ref 38–126)
Anion gap: 8 (ref 5–15)
BUN: 18 mg/dL (ref 8–23)
CO2: 27 mmol/L (ref 22–32)
Calcium: 9.1 mg/dL (ref 8.9–10.3)
Chloride: 106 mmol/L (ref 98–111)
Creatinine, Ser: 1.03 mg/dL (ref 0.61–1.24)
GFR, Estimated: >60 mL/min (ref >60)
Glucose, Bld: 86 mg/dL (ref 70–99)
Potassium: 4.1 mmol/L (ref 3.5–5.1)
Sodium: 141 mmol/L (ref 135–145)
Total Bilirubin: 1 mg/dL (ref 0.0–1.2)
Total Protein: 6.7 g/dL (ref 6.5–8.1)

## 2024-03-20 LAB — CBC
HCT: 41.6 % (ref 39.0–52.0)
Hemoglobin: 13.6 g/dL (ref 13.0–17.0)
MCH: 31.9 pg (ref 26.0–34.0)
MCHC: 32.7 g/dL (ref 30.0–36.0)
MCV: 97.4 fL (ref 80.0–100.0)
Platelets: 154 K/uL (ref 150–400)
RBC: 4.27 MIL/uL (ref 4.22–5.81)
RDW: 13.2 % (ref 11.5–15.5)
WBC: 5.4 K/uL (ref 4.0–10.5)
nRBC: 0 % (ref 0.0–0.2)

## 2024-03-20 LAB — GLUCOSE, CAPILLARY: Glucose-Capillary: 94 mg/dL (ref 70–99)

## 2024-03-20 MED ORDER — APIXABAN (ELIQUIS) VTE STARTER PACK (10MG AND 5MG): ORAL_TABLET | ORAL | 1 refills | Status: AC

## 2024-03-20 MED ORDER — APIXABAN 5 MG PO TABS
10.0000 mg | ORAL_TABLET | Freq: Two times a day (BID) | ORAL | Status: DC
  Administered 2024-03-20: 10 mg via ORAL
  Filled 2024-03-20: qty 2

## 2024-03-20 MED ORDER — CARBIDOPA-LEVODOPA 25-250 MG PO TABS
2.0000 | ORAL_TABLET | Freq: Every day | ORAL | Status: DC
  Administered 2024-03-20: 2 via ORAL
  Filled 2024-03-20 (×2): qty 2

## 2024-03-20 MED ORDER — CLONAZEPAM 0.5 MG PO TABS
0.5000 mg | ORAL_TABLET | Freq: Every day | ORAL | Status: DC
  Administered 2024-03-20: 0.5 mg via ORAL
  Filled 2024-03-20: qty 1

## 2024-03-20 NOTE — Consult Note (Signed)
 PHARMACY - ANTICOAGULATION CONSULT NOTE  Pharmacy Consult for Heparin  infusion Indication: chest pain/ACS  Allergies  Allergen Reactions   Bee Venom Anaphylaxis   Motrin [Ibuprofen] Anaphylaxis   Penicillins Anaphylaxis   Prednisone Palpitations   Celexa [Citalopram] Other (See Comments)    Dizziness    Depakote [Valproic Acid] Other (See Comments)    Drowsiness    Aspirin  Palpitations   Cymbalta [Duloxetine Hcl] Swelling and Anxiety   Percocet [Oxycodone -Acetaminophen ] Rash    Pt will take PRN, rarely.    Patient Measurements: Height: 5' 8 (172.7 cm) Weight: 112.5 kg (248 lb 1.6 oz) IBW/kg (Calculated) : 68.4 HEPARIN  DW (KG): 93.6  Vital Signs: Temp: 97.6 F (36.4 C) (08/02 0317) Temp Source: Oral (08/02 0317) BP: 115/69 (08/02 0317) Pulse Rate: 54 (08/02 0317)  Labs: Recent Labs    03/19/24 1143 03/19/24 1343 03/19/24 1520 03/19/24 2222 03/20/24 0519  HGB 13.5  --   --   --  13.6  HCT 39.8  --   --   --  41.6  PLT 144*  --   --   --  154  APTT  --   --  33  --   --   LABPROT  --   --  15.0  --   --   INR  --   --  1.1  --   --   HEPARINUNFRC  --   --   --  0.28* 0.44  CREATININE 1.27*  --   --   --   --   TROPONINIHS 3 4  --   --   --    Estimated Creatinine Clearance: 69.6 mL/min (A) (by C-G formula based on SCr of 1.27 mg/dL (H)).  Medical History: Past Medical History:  Diagnosis Date   Allergic rhinitis    Back pain    BPH (benign prostatic hyperplasia)    Enlarged prostate    Fatty liver    Gout    Lumbar degenerative disc disease    Nephrolithiasis    Neuromuscular disorder (HCC)    Palpitation    Panic attacks    Panic attacks    Parkinson's disease (HCC)    Sleep apnea    Urge incontinence     Medications:  NO AC per medrec and fill history  Assessment: Patient with PMH relevant for Parkinson's disease, BPH, and GAD presents to ED with chest pain. Pharmacy consulted to initiate and manage heparin  infusion for CAD.  Goal of  Therapy:  Heparin  level 0.3-0.7 units/ml Monitor platelets by anticoagulation protocol: Yes   Plan:  8/2:  HL @ 0519 = 0.44, therapeutic X 1 - continue pt on current rate and recheck HL in 6 hrs  Continue to monitor H&H and platelets  Maricsa Sammons D 03/20/2024 6:29 AM

## 2024-03-20 NOTE — Care Management Obs Status (Signed)
 MEDICARE OBSERVATION STATUS NOTIFICATION   Patient Details  Name: Gabriel Dillon MRN: [id number] Date of Birth: 1955/12/05   Medicare Observation Status Notification Given:  No (patient do not want a copy)    Gabriel Dillon 03/20/2024, 12:23 PM

## 2024-03-20 NOTE — Progress Notes (Signed)
 Discharge instructions and med details reviewed with patient and patient's wife over the phone. Both verbalized understanding. Printed AVS given to patient. Patient and wife stated understanding that he needs to pick up Eliquis  prescription from pharmacy today and take bedtime dose.  IV removed. Patient assisted with getting dressed. Patient's brother here to transport patient home. Pt escorted out via wheelchair.   Jaeanna Mccomber G Patino Hernandez, RN

## 2024-03-20 NOTE — Plan of Care (Signed)
  Problem: Education: Goal: Knowledge of disease or condition will improve Outcome: Progressing Goal: Knowledge of the prescribed therapeutic regimen will improve Outcome: Progressing   Problem: Activity: Goal: Ability to tolerate increased activity will improve Outcome: Progressing Goal: Will verbalize the importance of balancing activity with adequate rest periods Outcome: Progressing   Problem: Respiratory: Goal: Levels of oxygenation will improve Outcome: Progressing Goal: Ability to maintain adequate ventilation will improve Outcome: Progressing

## 2024-03-20 NOTE — Discharge Summary (Signed)
 Gabriel Dillon FMW:[id number] DOB: 06/13/1956 DOA: 03/19/2024  PCP: Pcp, No  Admit date: 03/19/2024 Discharge date: 03/20/2024  Time spent: 35 minutes  Recommendations for Outpatient Follow-up:  Pcp f/u 1-2 weeks, consider hematology referral     Discharge Diagnoses:  Principal Problem:   Pulmonary embolism (HCC) Active Problems:   Parkinson's disease (HCC)   Panic disorder   Lumbar degenerative disc disease   Restless leg syndrome   Generalized anxiety disorder   Hyperlipidemia, mixed   Impaired glucose tolerance   OSA (obstructive sleep apnea)   MCI (mild cognitive impairment)   Discharge Condition: stable  Diet recommendation: heart healthy  Filed Weights   03/19/24 1142  Weight: 112.5 kg    History of present illness:  From admission h and p Gabriel Dillon is a 68 y.o. male with medical history significant of allergic rhinitis, anxiety, B12 deficiency, BPH, COPD, constipation, fatty liver, mixed hyperlipidemia, impaired glucose tolerance, loss of memory, lumbar DDD, lumbar stenosis without neurogenic claudication, lymphedema both lower extremity, obstructive sleep apnea, Parkinson's disease, pelvic and perineal pain, prostatitis, restless leg syndrome, history of syncope, urinary urgency who presented to the emergency department with complaints of sudden onset of chest pain associated with dyspnea and generalized weakness. No chest pain, palpitations, diaphoresis, PND, orthopnea, but has RLE edema. He denied fever, chills, rhinorrhea, sore throat, wheezing or hemoptysis. No abdominal pain, nausea, emesis, diarrhea, constipation, melena or hematochezia. No flank pain, dysuria, frequency or hematuria. No polyuria, polydipsia, polyphagia or blurred vision.   Hospital Course:  Patient presents with pleuritic chest pain. Found to have small right lung peripheral PE, no DVT. No respiratory failure or cor pulmonale. Symptoms resolved with starting heparin , feels back to baseline. Is  relatively stationary at baseline 2/2 parkinson's disease but no recent changes to suggest a provoked PE so this appears to be unprovoked. Will need close pcp f/u to ensure patient is up-to-date with routine cancer screenings. Consider hematology referral. Discharged on apixaban . D/c plan reviewed with patient's wife who agrees with the plan.  Procedures: none   Consultations: none  Discharge Exam: Vitals:   03/20/24 0317 03/20/24 0753  BP: 115/69 (!) 145/85  Pulse: (!) 54 (!) 59  Resp: 20 16  Temp: 97.6 F (36.4 C) 98.1 F (36.7 C)  SpO2: 96% 99%    General: NAD Cardiovascular: RRR Respiratory: CTAB  Discharge Instructions   Discharge Instructions     Diet - low sodium heart healthy   Complete by: As directed    Diet - low sodium heart healthy   Complete by: As directed    Increase activity slowly   Complete by: As directed    Increase activity slowly   Complete by: As directed       Allergies as of 03/20/2024       Reactions   Bee Venom Anaphylaxis   Motrin [ibuprofen] Anaphylaxis   Penicillins Anaphylaxis   Prednisone Palpitations   Celexa [citalopram] Other (See Comments)   Dizziness    Depakote [valproic Acid] Other (See Comments)   Drowsiness    Aspirin  Palpitations   Cymbalta [duloxetine Hcl] Swelling, Anxiety   Percocet [oxycodone -acetaminophen ] Rash   Pt will take PRN, rarely.        Medication List     STOP taking these medications    meloxicam 15 MG tablet Commonly known as: MOBIC       TAKE these medications    acetaminophen  500 MG tablet Commonly known as: TYLENOL  Take 1,000 mg by mouth  every 6 (six) hours as needed for moderate pain, fever or headache.   Apixaban  Starter Pack (10mg  and 5mg ) Commonly known as: ELIQUIS  STARTER PACK Take as directed on package: start with two-5mg  tablets twice daily for 7 days. On day 8, switch to one-5mg  tablet twice daily.   Baclofen 5 MG Tabs Take 5 mg by mouth 2 (two) times daily.    carbidopa -levodopa  25-250 MG tablet Commonly known as: SINEMET  IR Take 2 tablets by mouth 3 (three) times daily.   clonazePAM  0.5 MG tablet Commonly known as: KLONOPIN  Take 0.5 mg by mouth 5 (five) times daily.   donepezil  5 MG tablet Commonly known as: ARICEPT  Take 5 mg by mouth daily.   gabapentin  300 MG capsule Commonly known as: NEURONTIN  Take 2 capsules (600 mg total) by mouth at bedtime AND 2 capsules (600 mg total) 2 (two) times daily with breakfast and lunch.   QUEtiapine  100 MG tablet Commonly known as: SEROQUEL  Take 1 tablet (100 mg total) by mouth at bedtime.   rOPINIRole  2 MG tablet Commonly known as: REQUIP  Take 4 mg by mouth at bedtime.   traMADol  50 MG tablet Commonly known as: ULTRAM  Take 50 mg by mouth 2 (two) times daily as needed for moderate pain.   venlafaxine  XR 75 MG 24 hr capsule Commonly known as: EFFEXOR -XR Take 75 mg by mouth daily.   VITAMIN B-12 PO Take 1 tablet by mouth daily.   VITAMIN C PO Take 1 tablet by mouth daily.       Allergies  Allergen Reactions   Bee Venom Anaphylaxis   Motrin [Ibuprofen] Anaphylaxis   Penicillins Anaphylaxis   Prednisone Palpitations   Celexa [Citalopram] Other (See Comments)    Dizziness    Depakote [Valproic Acid] Other (See Comments)    Drowsiness    Aspirin  Palpitations   Cymbalta [Duloxetine Hcl] Swelling and Anxiety   Percocet [Oxycodone -Acetaminophen ] Rash    Pt will take PRN, rarely.    Follow-up Information     your primary care provider Follow up.   Why: in 1-2 weeks                 The results of significant diagnostics from this hospitalization (including imaging, microbiology, ancillary and laboratory) are listed below for reference.    Significant Diagnostic Studies: CT Angio Chest PE W/Cm &/Or Wo Cm Result Date: 03/19/2024 CLINICAL DATA:  Chest pain difficulty breathing. EXAM: CT ANGIOGRAPHY CHEST WITH CONTRAST TECHNIQUE: Multidetector CT imaging of the chest was  performed using the standard protocol during bolus administration of intravenous contrast. Multiplanar CT image reconstructions and MIPs were obtained to evaluate the vascular anatomy. RADIATION DOSE REDUCTION: This exam was performed according to the departmental dose-optimization program which includes automated exposure control, adjustment of the mA and/or kV according to patient size and/or use of iterative reconstruction technique. CONTRAST:  75mL OMNIPAQUE  IOHEXOL  350 MG/ML SOLN COMPARISON:  CTA chest 05/02/2022. Chest x-ray 03/19/2024 and lower extremity Doppler ultrasound FINDINGS: Cardiovascular: There is a subtle filling defect within the branch point in the right lower lobe along the pulmonary artery, series 6, image 227, series 7, image 114. A subtle embolus is possible. No additional areas of segmental larger pulmonary emboli clearly seen today. There is some motion which can limit evaluation of small and peripheral emboli. Coronary artery calcifications are seen. No pericardial effusion. The thoracic aorta is normal course and caliber with some mild atherosclerotic calcified plaque. Mediastinum/Nodes: Patulous esophagus. Stable asymmetry of the right thyroid lobe. No specific  abnormal lymph node enlargement identified in the axillary region, hilum or mediastinum. Lungs/Pleura: Breathing motion. There is some linear opacity along bases likely scar or atelectasis. No consolidation, pneumothorax or effusion. Small calcified nodules in the right lung base on image 90 of series 5 consistent with old granulomatous disease. Small left upper lobe posterior ground-glass nodule measuring 4 mm on series 5, image 67 Upper Abdomen: Nodular liver with some splenic enlargement. Varices. Adrenal glands are preserved. Musculoskeletal: Diffuse degenerative changes identified. Old right-sided rib deformities. Review of the MIP images confirms the above findings. IMPRESSION: Small filling defect along branch point in the  medial right lung base along the pulmonary artery consistent with a subtle peripheral pulmonary embolus. No larger or more central emboli clearly seen at this time. Underinflation with scattered scarring atelectatic changes. There is a small ground-glass nodule in the lingula measuring 4 mm If patient is low risk for malignancy, no routine follow-up imaging is recommended. If patient is high risk for malignancy, a non-contrast chest CT at 12 months is optional.This recommendation follows the consensus statement: Guidelines for Management of Incidental Pulmonary Nodules Detected on CT Images: From the Fleischner Society 2017; Radiology 2017; [phone]. Nodular liver with splenomegaly and varices. Aortic Atherosclerosis (ICD10-I70.0). Electronically Signed   By: Ranell Bring M.D.   On: 03/19/2024 14:53   US  Venous Img Lower Right (DVT Study) Result Date: 03/19/2024 EXAM: ULTRASOUND DUPLEX OF THE RIGHT LOWER EXTREMITY VEINS TECHNIQUE: Duplex ultrasound using B-mode/gray scaled imaging and Doppler spectral analysis and color flow was obtained of the deep venous structures of the right lower extremity. COMPARISON: None. CLINICAL HISTORY: SOB. FINDINGS: The visualized veins of the lower extremity are patent and free of echogenic thrombus. The veins demonstrate good compressibility with normal color flow study and spectral analysis. Limited views of the contralateral  left common femoral vein are unremarkable. IMPRESSION: 1. No evidence of DVT. Electronically signed by: Katheleen Faes MD 03/19/2024 01:56 PM EDT RP Workstation: [id number]   DG Chest 1 View Result Date: 03/19/2024 CLINICAL DATA:  Shortness of breath.  Chest pain. EXAM: CHEST  1 VIEW COMPARISON:  04/03/2023. FINDINGS: Low lung volume. Bilateral lung fields are clear. Bilateral costophrenic angles are clear. Note is made of elevated right hemidiaphragm. Normal cardio-mediastinal silhouette. No acute osseous abnormalities. The soft tissues are within  normal limits. IMPRESSION: No active disease. Electronically Signed   By: Ree Molt M.D.   On: 03/19/2024 12:08    Microbiology: No results found for this or any previous visit (from the past 240 hours).   Labs: Basic Metabolic Panel: Recent Labs  Lab 03/19/24 1143 03/20/24 0519  NA 140 141  K 4.3 4.1  CL 103 106  CO2 28 27  GLUCOSE 95 86  BUN 19 18  CREATININE 1.27* 1.03  CALCIUM 9.2 9.1   Liver Function Tests: Recent Labs  Lab 03/19/24 1143 03/20/24 0519  AST 30 28  ALT 7 16  ALKPHOS 99 89  BILITOT 1.1 1.0  PROT 7.1 6.7  ALBUMIN 3.8 3.4*   No results for input(s): LIPASE, AMYLASE in the last 168 hours. No results for input(s): AMMONIA in the last 168 hours. CBC: Recent Labs  Lab 03/19/24 1143 03/20/24 0519  WBC 5.3 5.4  NEUTROABS 3.6  --   HGB 13.5 13.6  HCT 39.8 41.6  MCV 95.9 97.4  PLT 144* 154   Cardiac Enzymes: No results for input(s): CKTOTAL, CKMB, CKMBINDEX, TROPONINI in the last 168 hours. BNP: BNP (last 3 results) Recent Labs  03/19/24 1143  BNP 32.4    ProBNP (last 3 results) No results for input(s): PROBNP in the last 8760 hours.  CBG: No results for input(s): GLUCAP in the last 168 hours.     Signed:  Devaughn KATHEE Ban MD.  [facility] 03/20/2024, 1:51 PM

## 2024-03-20 NOTE — Consult Note (Signed)
 PHARMACY - ANTICOAGULATION CONSULT NOTE  Pharmacy Consult for Heparin  infusion Indication: chest pain/ACS  Allergies  Allergen Reactions   Bee Venom Anaphylaxis   Motrin [Ibuprofen] Anaphylaxis   Penicillins Anaphylaxis   Prednisone Palpitations   Celexa [Citalopram] Other (See Comments)    Dizziness    Depakote [Valproic Acid] Other (See Comments)    Drowsiness    Aspirin  Palpitations   Cymbalta [Duloxetine Hcl] Swelling and Anxiety   Percocet [Oxycodone -Acetaminophen ] Rash    Pt will take PRN, rarely.    Patient Measurements: Height: 5' 8 (172.7 cm) Weight: 112.5 kg (248 lb 1.6 oz) IBW/kg (Calculated) : 68.4 HEPARIN  DW (KG): 93.6  Vital Signs: Temp: 98.1 F (36.7 C) (08/02 0753) Temp Source: Oral (08/02 0753) BP: 145/85 (08/02 0753) Pulse Rate: 59 (08/02 0753)  Labs: Recent Labs    03/19/24 1143 03/19/24 1343 03/19/24 1520 03/19/24 2222 03/20/24 0519 03/20/24 1117  HGB 13.5  --   --   --  13.6  --   HCT 39.8  --   --   --  41.6  --   PLT 144*  --   --   --  154  --   APTT  --   --  33  --   --   --   LABPROT  --   --  15.0  --   --   --   INR  --   --  1.1  --   --   --   HEPARINUNFRC  --   --   --  0.28* 0.44 0.45  CREATININE 1.27*  --   --   --  1.03  --   TROPONINIHS 3 4  --   --   --   --    Estimated Creatinine Clearance: 85.8 mL/min (by C-G formula based on SCr of 1.03 mg/dL).  Medical History: Past Medical History:  Diagnosis Date   Allergic rhinitis    Back pain    BPH (benign prostatic hyperplasia)    Enlarged prostate    Fatty liver    Gout    Lumbar degenerative disc disease    Nephrolithiasis    Neuromuscular disorder (HCC)    Palpitation    Panic attacks    Panic attacks    Parkinson's disease (HCC)    Sleep apnea    Urge incontinence     Medications:  NO AC per medrec and fill history  Assessment: Patient with PMH relevant for Parkinson's disease, BPH, and GAD presents to ED with chest pain. Pharmacy consulted to initiate  and manage heparin  infusion for CAD.  Goal of Therapy:  Heparin  level 0.3-0.7 units/ml Monitor platelets by anticoagulation protocol: Yes   Plan:  8/2:  HL @ 1117 = 0.45, therapeutic X 2 - continue pt on current rate and recheck HL tomorrow with morning labs - continue to monitor H&H and platelets  Gabriel Dillon 03/20/2024 12:35 PM
